# Patient Record
Sex: Male | Born: 1950 | Race: White | Hispanic: No | Marital: Married | Smoking: Former smoker
Health system: Southern US, Community
[De-identification: ages and names within clinical notes are randomized; demographics above are authoritative.]

## PROBLEM LIST (undated history)

## (undated) DIAGNOSIS — I499 Cardiac arrhythmia, unspecified: Secondary | ICD-10-CM

## (undated) DIAGNOSIS — I4811 Longstanding persistent atrial fibrillation: Secondary | ICD-10-CM

## (undated) DIAGNOSIS — M1990 Unspecified osteoarthritis, unspecified site: Secondary | ICD-10-CM

## (undated) DIAGNOSIS — I10 Essential (primary) hypertension: Secondary | ICD-10-CM

## (undated) DIAGNOSIS — C801 Malignant (primary) neoplasm, unspecified: Secondary | ICD-10-CM

## (undated) DIAGNOSIS — E785 Hyperlipidemia, unspecified: Secondary | ICD-10-CM

## (undated) DIAGNOSIS — Z87442 Personal history of urinary calculi: Secondary | ICD-10-CM

## (undated) HISTORY — DX: Hyperlipidemia, unspecified: E78.5

## (undated) HISTORY — DX: Essential (primary) hypertension: I10

## (undated) HISTORY — DX: Longstanding persistent atrial fibrillation: I48.11

---

## 1998-11-09 HISTORY — PX: OTHER SURGICAL HISTORY: SHX169

## 1998-11-30 ENCOUNTER — Encounter: Payer: Self-pay | Admitting: Specialist

## 1998-11-30 ENCOUNTER — Observation Stay (HOSPITAL_COMMUNITY): Admission: RE | Admit: 1998-11-30 | Discharge: 1998-12-01 | Payer: Self-pay | Admitting: Specialist

## 2004-09-02 ENCOUNTER — Emergency Department (HOSPITAL_COMMUNITY): Admission: EM | Admit: 2004-09-02 | Discharge: 2004-09-02 | Payer: Self-pay | Admitting: Emergency Medicine

## 2006-04-09 ENCOUNTER — Ambulatory Visit (HOSPITAL_BASED_OUTPATIENT_CLINIC_OR_DEPARTMENT_OTHER): Admission: RE | Admit: 2006-04-09 | Discharge: 2006-04-09 | Payer: Self-pay | Admitting: Family Medicine

## 2006-04-13 ENCOUNTER — Ambulatory Visit: Payer: Self-pay | Admitting: Internal Medicine

## 2013-04-28 ENCOUNTER — Encounter (HOSPITAL_COMMUNITY): Payer: Self-pay | Admitting: *Deleted

## 2013-04-28 ENCOUNTER — Encounter: Payer: Self-pay | Admitting: Internal Medicine

## 2013-04-28 ENCOUNTER — Ambulatory Visit (INDEPENDENT_AMBULATORY_CARE_PROVIDER_SITE_OTHER): Payer: BC Managed Care – PPO | Admitting: Internal Medicine

## 2013-04-28 VITALS — BP 162/87 | HR 78 | Ht 69.0 in | Wt 185.9 lb

## 2013-04-28 DIAGNOSIS — I10 Essential (primary) hypertension: Secondary | ICD-10-CM

## 2013-04-28 DIAGNOSIS — R5383 Other fatigue: Secondary | ICD-10-CM

## 2013-04-28 DIAGNOSIS — I4892 Unspecified atrial flutter: Secondary | ICD-10-CM | POA: Insufficient documentation

## 2013-04-28 DIAGNOSIS — I493 Ventricular premature depolarization: Secondary | ICD-10-CM

## 2013-04-28 DIAGNOSIS — E785 Hyperlipidemia, unspecified: Secondary | ICD-10-CM | POA: Insufficient documentation

## 2013-04-28 DIAGNOSIS — I491 Atrial premature depolarization: Secondary | ICD-10-CM

## 2013-04-28 DIAGNOSIS — R5381 Other malaise: Secondary | ICD-10-CM

## 2013-04-28 DIAGNOSIS — I499 Cardiac arrhythmia, unspecified: Secondary | ICD-10-CM

## 2013-04-28 DIAGNOSIS — I4949 Other premature depolarization: Secondary | ICD-10-CM

## 2013-04-28 NOTE — Progress Notes (Signed)
OFFICE NOTE  Chief Complaint:  Irregular heart beat, progressive fatigue with exertion  Primary Care Physician: No PCP Per Patient  HPI:  Steve Weaver is a pleasant 62 year old male who has been experiencing some increasing fatigue with exertion and decreased exercise tolerance over the past several years. 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 He has since struggled, working a number of jobs in order to make ends meet.  He used to be very physically active however recently has stopped exercising and has had about 20 pounds of weight gain. He found that he's been more fatigued during certain activities for example planting some bushes in the yard caused him more shortness of breath and fatigue that he was typically use to. He was recently seen for a well check by his primary care provider, including followup on his hypertension and dyslipidemia. He was noted to have an irregular heartbeat an EKG performed apparently showed premature ventricular contractions. I subsequently received the EKG which shows premature atrial contractions. An EKG performed in our office today also shows PACs, for which she is not completely aware of and it is unclear whether this is new or old.  He denies any specific angina.  PMHx:  Past Medical History  Diagnosis Date  . Hyperlipidemia   . Hypertension     History reviewed. No pertinent past surgical history.  FAMHx:  No family history on file.  SOCHx:   reports that he has been smoking Cigars.  He has never used smokeless tobacco. He reports that he drinks about 3.0 ounces of alcohol per week. He reports that he does not use illicit drugs.  ALLERGIES:  No Known Allergies  ROS: A comprehensive review of systems was negative except for: Constitutional: positive for fatigue Respiratory: positive for dyspnea on exertion Cardiovascular: positive for irregular heart beat  HOME  MEDS: Current Outpatient Prescriptions  Medication Sig Dispense Refill  . aspirin 81 MG tablet Take 81 mg by mouth daily.      [name] lisinopril (PRINIVIL,ZESTRIL) 20 MG tablet Take 1 tablet by mouth daily.      [name] lovastatin (MEVACOR) 40 MG tablet Take 1 tablet by mouth daily.      . Multiple Vitamin (MULTIVITAMIN) capsule Take 1 capsule by mouth daily.      . Omega-3 Fatty Acids (FISH OIL) 1200 MG CAPS Take 2,400 mg by mouth daily.       No current facility-administered medications for this visit.    LABS/IMAGING: No results found for this or any previous visit (from the past 48 hour(s)). No results found.  VITALS: BP 162/87  Pulse 78  Ht 5\' 9"  (1.753 m)  Wt 185 lb 14.4 oz (84.324 kg)  BMI 27.44 kg/m2  EXAM: General appearance: alert and no distress Neck: no carotid bruit and no JVD Lungs: clear to auscultation bilaterally Heart: regular rate and rhythm, S1, S2 normal, no murmur, click, rub or gallop Abdomen: soft, non-tender; bowel sounds normal; no masses,  no organomegaly Extremities: extremities normal, atraumatic, no cyanosis or edema Pulses: 2+ and symmetric Skin: Skin color, texture, turgor normal. No rashes or lesions Neurologic: Grossly normal Psych: mood, affect normal  EKG: Normal sinus rhythm at 78, PACs, nonspecific ST changes  ASSESSMENT: 1. Premature atrial contractions 2. Progressive fatigue and dyspnea on exertion 3. Hypertension 4. Dyslipidemia 5. Abnormal EKG  PLAN: 1.   Steve Weaver has an abnormal EKG demonstrating premature atrial contractions. There  was some concern on referral for PVCs however this is not seen on either EKG. After further questioning he's noted some progressive fatigue and dyspnea on exertion, which is not clearly cardiac but could indicate an ischemic problem. It may also be related to weight gain and less exercise. Either way, he does have risk factors for heart disease and given his age in the 75s, would recommend an exercise nuclear  stress test to further risk stratify him. He is interested in getting back into some exercise and weight loss and it would be advisable for stress testing prior to starting an exercise program.  If the stress test is negative, I would not aggressively try to treat his PACs as he is asymptomatic (meaning he did not feel palpitations).  Then I would recommend an exercise program.  Thank you again for this kind referral.  Chrystie Nose, MD, [facility] Attending Cardiologist [facility]  HILTY,Kenneth C 04/28/2013, 4:31 PM

## 2013-04-28 NOTE — Patient Instructions (Signed)
Your physician has requested that you have en exercise stress myoview. For further information please visit [email or web address] Please follow instruction sheet, as given.  Please schedule a follow up appointment after your stress test.

## 2013-05-04 ENCOUNTER — Encounter: Payer: Self-pay | Admitting: Internal Medicine

## 2013-05-04 ENCOUNTER — Ambulatory Visit (HOSPITAL_COMMUNITY)
Admission: RE | Admit: 2013-05-04 | Discharge: 2013-05-04 | Disposition: A | Discharge status: Home / Self Care | Payer: BC Managed Care – PPO | Source: Ambulatory Visit | Attending: Cardiovascular Disease | Admitting: Cardiovascular Disease

## 2013-05-04 ENCOUNTER — Other Ambulatory Visit: Payer: Self-pay | Admitting: *Deleted

## 2013-05-04 DIAGNOSIS — I4892 Unspecified atrial flutter: Secondary | ICD-10-CM

## 2013-05-04 DIAGNOSIS — R0609 Other forms of dyspnea: Secondary | ICD-10-CM | POA: Insufficient documentation

## 2013-05-04 DIAGNOSIS — I4891 Unspecified atrial fibrillation: Secondary | ICD-10-CM

## 2013-05-04 DIAGNOSIS — R5381 Other malaise: Secondary | ICD-10-CM | POA: Insufficient documentation

## 2013-05-04 DIAGNOSIS — I4949 Other premature depolarization: Secondary | ICD-10-CM

## 2013-05-04 DIAGNOSIS — I10 Essential (primary) hypertension: Secondary | ICD-10-CM | POA: Insufficient documentation

## 2013-05-04 DIAGNOSIS — E663 Overweight: Secondary | ICD-10-CM | POA: Insufficient documentation

## 2013-05-04 DIAGNOSIS — R002 Palpitations: Secondary | ICD-10-CM | POA: Insufficient documentation

## 2013-05-04 DIAGNOSIS — R0989 Other specified symptoms and signs involving the circulatory and respiratory systems: Secondary | ICD-10-CM | POA: Insufficient documentation

## 2013-05-04 DIAGNOSIS — E785 Hyperlipidemia, unspecified: Secondary | ICD-10-CM

## 2013-05-04 DIAGNOSIS — R9431 Abnormal electrocardiogram [ECG] [EKG]: Secondary | ICD-10-CM | POA: Insufficient documentation

## 2013-05-04 DIAGNOSIS — I493 Ventricular premature depolarization: Secondary | ICD-10-CM

## 2013-05-04 DIAGNOSIS — R5383 Other fatigue: Secondary | ICD-10-CM

## 2013-05-04 DIAGNOSIS — F17200 Nicotine dependence, unspecified, uncomplicated: Secondary | ICD-10-CM | POA: Insufficient documentation

## 2013-05-04 HISTORY — PX: NM MYOCAR PERF WALL MOTION: HXRAD629

## 2013-05-04 MED ORDER — METOPROLOL TARTRATE 25 MG PO TABS: 25.0000 mg | ORAL_TABLET | Freq: Two times a day (BID) | ORAL | Status: DC

## 2013-05-04 MED ORDER — TECHNETIUM TC 99M SESTAMIBI GENERIC - CARDIOLITE
10.0000 | Freq: Once | INTRAVENOUS | Status: AC | PRN
  Administered 2013-05-04: 10 via INTRAVENOUS

## 2013-05-04 MED ORDER — TECHNETIUM TC 99M SESTAMIBI GENERIC - CARDIOLITE
30.0000 | Freq: Once | INTRAVENOUS | Status: AC | PRN
  Administered 2013-05-04: 30 via INTRAVENOUS

## 2013-05-04 NOTE — Progress Notes (Signed)
Ok .. Thanks.  -[place]

## 2013-05-04 NOTE — Procedures (Addendum)
[facility] [address] [place] [address] [phone]  Cardiology Nuclear Med Study  Steve Weaver is a 62 y.o. male     MRN : [id number]     DOB: Jun 27, 1950  Procedure Date: 05/04/2013  Nuclear Med Background Indication for Stress Test:  Evaluation for Ischemia and Abnormal EKG History:  Pt denies prior history. Cardiac Risk Factors: Hypertension, Lipids, Overweight and Smoker  Symptoms:  DOE, Fatigue and Palpitations   Nuclear Pre-Procedure Caffeine/Decaff Intake:  7:00pm NPO After: 5:00am   IV Site: R Forearm  IV 0.9% NS with Angio Cath:  22g  Chest Size (in):  42"  IV Started by: Emmit Pomfret, RN  Height: 5\' 9"  (1.753 m)  Cup Size: n/a  BMI:  Body mass index is 27.31 kg/(m^2). Weight:  185 lb (83.915 kg)   Tech Comments:  n/a    Nuclear Med Study 1 or 2 day study: 1 day  Stress Test Type:  Stress  Order Authorizing Provider:  Zoila Shutter, MD   Resting Radionuclide: Technetium 35m Sestamibi  Resting Radionuclide Dose: 10.4 mCi   Stress Radionuclide:  Technetium 75m Sestamibi  Stress Radionuclide Dose: 29.8 mCi           Stress Protocol Rest HR: 65 Stress HR:157  Rest BP:145/85 Stress BP: 182/82  Exercise Time (min): 06:18 METS: 7.10   Predicted Max HR: 156 bpm % Max HR: 100.64 bpm    Dose of Adenosine (mg):  n/a Dose of Lexiscan: n/a mg  Dose of Atropine (mg): n/a Dose of Dobutamine: n/a mcg/kg/min (at max HR)  Stress Test Technologist: Ernestene Mention, CCT Nuclear Technologist: Koren Shiver, CNMT   Rest Procedure:  Myocardial perfusion imaging was performed at rest 45 minutes following the intravenous administration of Technetium 86m Sestamibi. Stress Procedure:  The patient performed treadmill exercise using a Bruce  Protocol for 6 minutes and 18 seconds. The patient stopped due to ST Depression. Patient denied any chest pain.  There were  significant ST-T wave changes.  Technetium 79m  Sestamibi was injected at peak exercise and myocardial perfusion imaging was performed after a brief delay.  Transient Ischemic Dilatation (Normal <1.22):  0.64 Lung/Heart Ratio (Normal <0.45):  0.26 QGS EDV:  50 ml QGS ESV:  21 ml LV Ejection Fraction: 59%    Rest ECG: NSR - Normal EKG  Stress ECG: Developed atrial flutter with 2:1 AV block during exercise. During recovery, returned briefly to NSR with PACs, then had more atrial flutter, but also atrial fibrillation with rapid ventricular response.  QPS Raw Data Images:  Normal; no motion artifact; normal heart/lung ratio. Stress Images:  Normal homogeneous uptake in all areas of the myocardium. Rest Images:  Normal homogeneous uptake in all areas of the myocardium. Subtraction (SDS):  No evidence of ischemia. LV Wall Motion:  NL LV Function; NL Wall Motion  Impression Exercise Capacity:  , exercise limited by tacharrhythmia. BP Response:  Hypertensive blood pressure response. Clinical Symptoms:  Palpitations, anxiety ECG Impression:  Cannot interpret ST changes due to prominent atrial flutter waves Comparison with Prior Nuclear Study: No previous nuclear study performed   Overall Impression:  Normal stress nuclear study. Atrial flutter andd atrial fibrillation with rapid ventricular response.  Metoprolol 25 mg BID was ordered. Scheduled for echocardiogram prior to his follow up with Dr. Rennis Golden.   [name], MD  05/04/2013 12:44 PM

## 2013-05-11 ENCOUNTER — Ambulatory Visit (HOSPITAL_COMMUNITY)
Admission: RE | Admit: 2013-05-11 | Discharge: 2013-05-11 | Disposition: A | Discharge status: Home / Self Care | Payer: BC Managed Care – PPO | Source: Ambulatory Visit | Attending: Internal Medicine | Admitting: Internal Medicine

## 2013-05-11 DIAGNOSIS — I4892 Unspecified atrial flutter: Secondary | ICD-10-CM

## 2013-05-11 NOTE — Progress Notes (Signed)
2D Echo Performed 05/11/2013    Bari Handshoe, RCS  

## 2013-05-19 ENCOUNTER — Ambulatory Visit (INDEPENDENT_AMBULATORY_CARE_PROVIDER_SITE_OTHER): Payer: BC Managed Care – PPO | Admitting: Internal Medicine

## 2013-05-19 ENCOUNTER — Encounter: Payer: Self-pay | Admitting: Internal Medicine

## 2013-05-19 VITALS — BP 140/80 | HR 64 | Ht 69.0 in | Wt 187.0 lb

## 2013-05-19 DIAGNOSIS — I10 Essential (primary) hypertension: Secondary | ICD-10-CM

## 2013-05-19 DIAGNOSIS — I4892 Unspecified atrial flutter: Secondary | ICD-10-CM

## 2013-05-19 DIAGNOSIS — E785 Hyperlipidemia, unspecified: Secondary | ICD-10-CM

## 2013-05-19 NOTE — Patient Instructions (Signed)
Your physician wants you to follow-up in:  6 months. You will receive a reminder letter in the mail two months in advance. If you don't receive a letter, please call our office to schedule the follow-up appointment.   

## 2013-05-19 NOTE — Progress Notes (Signed)
OFFICE NOTE  Chief Complaint:  Irregular heart beat, progressive fatigue with exertion  Primary Care Physician: Steve Rily, MD  HPI:  Steve Weaver is a pleasant 62 year old male who has been experiencing some increasing fatigue with exertion and decreased exercise tolerance over the past several years. 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 He has since struggled, working a number of jobs in order to make ends meet.  He used to be very physically active however recently has stopped exercising and has had about 20 pounds of weight gain. He found that he's been more fatigued during certain activities for example planting some bushes in the yard caused him more shortness of breath and fatigue that he was typically use to. He was recently seen for a well check by his primary care provider, including followup on his hypertension and dyslipidemia. He was noted to have an irregular heartbeat an EKG performed apparently showed premature ventricular contractions. I subsequently received the EKG which shows premature atrial contractions. An EKG performed in our office today also shows PACs, for which she is not completely aware of and it is unclear whether this is new or old.  He denies any specific angina.  [name] underwent an echocardiogram and nuclear stress test in our office at the end of October and early November. The echocardiogram demonstrated normal systolic function with mild aortic insufficiency and mild mitral insufficiency. There was borderline right atrial enlargement.  He was reportedly very nervous about a stress test and at the onset was noted to be in a rapid heart rate of 150 which was regular and there were clear flutter waves. This terminated spontaneously. Perfusion imaging did not demonstrate any defects.  PMHx:  Past Medical History  Diagnosis Date  . Hyperlipidemia   . Hypertension      History reviewed. No pertinent past surgical history.  FAMHx:  Family History  Problem Relation Age of Onset  . Anemia Neg Hx   . Arrhythmia Neg Hx   . Asthma Neg Hx   . Clotting disorder Neg Hx   . Fainting Neg Hx   . Heart attack Neg Hx   . Heart disease Neg Hx   . Heart failure Neg Hx   . Hyperlipidemia Neg Hx   . Hypertension Neg Hx     SOCHx:   reports that he has been smoking Cigars.  He has never used smokeless tobacco. He reports that he drinks about 3.0 ounces of alcohol per week. He reports that he does not use illicit drugs.  ALLERGIES:  No Known Allergies  ROS: A comprehensive review of systems was negative except for: Constitutional: positive for fatigue Respiratory: positive for dyspnea on exertion Cardiovascular: positive for irregular heart beat  HOME MEDS: Current Outpatient Prescriptions  Medication Sig Dispense Refill  . aspirin 81 MG tablet Take 81 mg by mouth daily.      [name] lisinopril (PRINIVIL,ZESTRIL) 20 MG tablet Take 1 tablet by mouth daily.      [name] lovastatin (MEVACOR) 40 MG tablet Take 1 tablet by mouth daily.      . metoprolol tartrate (LOPRESSOR) 25 MG tablet Take 1 tablet (25 mg total) by mouth 2 (two) times daily.  60 tablet  6  . Multiple Vitamin (MULTIVITAMIN) capsule Take 1 capsule by mouth daily.      . Omega-3 Fatty Acids (FISH OIL) 1200 MG CAPS Take 2,400 mg by mouth daily.  No current facility-administered medications for this visit.    LABS/IMAGING: No results found for this or any previous visit (from the past 48 hour(s)). No results found.  VITALS: BP 140/80  Pulse 64  Ht 5\' 9"  (1.753 m)  Wt 187 lb (84.823 kg)  BMI 27.60 kg/m2  EXAM: deferred  EKG: Normal sinus rhythm at 64  ASSESSMENT: 1. Premature atrial contractions 2. Paroxysmal atrial flutter - CHADS2VASC score of 1 3. Hypertension 4. Dyslipidemia  PLAN: 1.   [name] had a negative nuclear stress test and an echocardiogram which showed some mild  valvular abnormalities. What was notable was that at rest prior to starting his stress test, he was noted to be in a regular tachycardia with flutter waves which appears to be atrial flutter and 2:1 AV conduction.  This was very brief and terminated spontaneously. He was subsequently started on metoprolol twice daily and is tolerating this well. He reports he has had a marked decrease in his palpitations and feels that his anxiety episodes are improved significantly.  We had a discussion today about his risk of recurrent flutter and the risk of stroke. His CHADS2VASC score is 1, which could necessitate increased anticoagulation. We did discuss the risk and benefits given his relatively younger age and in frequency of palpitations. It does seem that he's had significant improvement with metoprolol, and at this point he wishes to stay on aspirin therapy alone.  A plan to see him back in 6 months.  Steve Nose, MD, [facility] Attending Cardiologist [facility]  Steve Weaver 05/19/2013, 9:54 AM

## 2013-11-05 ENCOUNTER — Encounter: Payer: Self-pay | Admitting: *Deleted

## 2013-11-08 ENCOUNTER — Encounter: Payer: Self-pay | Admitting: Internal Medicine

## 2013-11-08 ENCOUNTER — Ambulatory Visit (INDEPENDENT_AMBULATORY_CARE_PROVIDER_SITE_OTHER): Payer: BC Managed Care – PPO | Admitting: Internal Medicine

## 2013-11-08 VITALS — BP 168/92 | HR 67 | Ht 69.0 in | Wt 188.0 lb

## 2013-11-08 DIAGNOSIS — R5381 Other malaise: Secondary | ICD-10-CM

## 2013-11-08 DIAGNOSIS — I491 Atrial premature depolarization: Secondary | ICD-10-CM

## 2013-11-08 DIAGNOSIS — R5383 Other fatigue: Secondary | ICD-10-CM

## 2013-11-08 DIAGNOSIS — I10 Essential (primary) hypertension: Secondary | ICD-10-CM

## 2013-11-08 DIAGNOSIS — E785 Hyperlipidemia, unspecified: Secondary | ICD-10-CM

## 2013-11-08 DIAGNOSIS — I4892 Unspecified atrial flutter: Secondary | ICD-10-CM

## 2013-11-08 NOTE — Patient Instructions (Signed)
Your physician wants you to follow-up in:  6 months. You will receive a reminder letter in the mail two months in advance. If you don't receive a letter, please call our office to schedule the follow-up appointment.   

## 2013-11-08 NOTE — Progress Notes (Signed)
OFFICE NOTE  Chief Complaint:  Irregular heart beat, progressive fatigue with exertion  Primary Care Physician: Andria Frames, MD  HPI:  Steve Weaver is a pleasant 63 year old male who has been experiencing some increasing fatigue with exertion and decreased exercise tolerance over the past several years. 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 He has since struggled, working a number of jobs in order to make ends meet.  He used to be very physically active however recently has stopped exercising and has had about 20 pounds of weight gain. He found that he's been more fatigued during certain activities for example planting some bushes in the yard caused him more shortness of breath and fatigue that he was typically use to. He was recently seen for a well check by his primary care provider, including followup on his hypertension and dyslipidemia. He was noted to have an irregular heartbeat an EKG performed apparently showed premature ventricular contractions. I subsequently received the EKG which shows premature atrial contractions. An EKG performed in our office today also shows PACs, for which she is not completely aware of and it is unclear whether this is new or old.  He denies any specific angina.  Steve Weaver underwent an echocardiogram and nuclear stress test in our office at the end of October and early November. The echocardiogram demonstrated normal systolic function with mild aortic insufficiency and mild mitral insufficiency. There was borderline right atrial enlargement.  He was reportedly very nervous about a stress test and at the onset was noted to be in a rapid heart rate of 150 which was regular and there were clear flutter waves. This terminated spontaneously. Perfusion imaging did not demonstrate any defects.  He returns today and has few complaints. He says that in January he was having some  problems with dizziness and felt that he was on too much blood pressure medication. He self discontinued his lisinopril and feels that he's gotten somewhat better. He occasionally has palpitations but only when he is anxious. Blood pressure is noted to be elevated today 168/92. He says he is not taking his blood pressures at home to compare and tablet [place] measurement. He recently had a mobile health screening which showed total cholesterol 190, LDL 109, HDL 48 and triglycerides 178.  PMHx:  Past Medical History  Diagnosis Date  . Hyperlipidemia   . Hypertension   . PAF (paroxysmal atrial fibrillation)     Past Surgical History  Procedure Laterality Date  . Broke leg  11/1998  . Nm myocar perf wall motion  05/04/2013    bruce myoview - normal stress nuclear study; a-flutter & a-fib with RVR, EF 59%    FAMHx:  Family History  Problem Relation Age of Onset  . Anemia Neg Hx   . Arrhythmia Neg Hx   . Asthma Neg Hx   . Clotting disorder Neg Hx   . Fainting Neg Hx   . Heart attack Neg Hx   . Heart disease Neg Hx   . Heart failure Neg Hx   . Hyperlipidemia Mother   . Hypertension Mother   . Emphysema Mother   . Diabetes Father   . Dementia Father     SOCHx:   reports that he has been smoking Cigars.  He has never used smokeless tobacco. He reports that he drinks about 3 ounces of alcohol per week. He reports that he does not use illicit  drugs.  ALLERGIES:  Allergies  Allergen Reactions  . Other Rash    Muscle Relaxer     ROS: A comprehensive review of systems was negative except for: Constitutional: positive for fatigue Respiratory: positive for dyspnea on exertion Cardiovascular: positive for irregular heart beat  HOME MEDS: Current Outpatient Prescriptions  Medication Sig Dispense Refill  . aspirin 81 MG tablet Take 81 mg by mouth daily.      [name] lovastatin (MEVACOR) 40 MG tablet Take 1 tablet by mouth daily.      . metoprolol tartrate (LOPRESSOR) 25 MG tablet Take 1  tablet (25 mg total) by mouth 2 (two) times daily.  60 tablet  6  . Multiple Vitamin (MULTIVITAMIN) capsule Take 1 capsule by mouth daily.      . Omega-3 Fatty Acids (FISH OIL) 1200 MG CAPS Take 2,400 mg by mouth daily.       No current facility-administered medications for this visit.    LABS/IMAGING: No results found for this or any previous visit (from the past 48 hour(s)). No results found.  VITALS: BP 168/92  Pulse 67  Ht 5\' 9"  (1.753 m)  Wt 188 lb (85.276 kg)  BMI 27.75 kg/m2  EXAM: Gen.: Awake, in no apparent distress HEENT: PERRLA, M.D. Lungs: Clear to auscultation bilaterally Cardiovascular: Regular rate and rhythm normal S1-S2 no S3 no murmurs rubs or gallops Abdomen: Soft, nontender, positive bowel sounds Extremity: No edema Neurologic: No gross deficits Psychiatric: Mood, affect normal Skin: No rashes  EKG: Normal sinus rhythm at 67  ASSESSMENT: 1. Premature atrial contractions - improved 2. Paroxysmal atrial flutter - CHADS2VASC score of 1 (with exercise) 3. Hypertension 4. Dyslipidemia  PLAN: 1.   Steve Weaver is not report any significant palpitations now on a beta blocker. I do not appreciate a return of his atrial flutter which may been related to exercise. Blood pressure remains elevated is persistent he discontinued his lisinopril which was due to feeling weak and or tired. He does say that he feels better but his blood pressure is higher today. I would like him to keep track of his blood pressures at home and contact her office with the laboratory measurements to see if we need to adjust his medications further. Finally his cholesterol is fairly well controlled with LDL of 109 on lovastatin. No changes on that medication today. Plan to see him back in 6 months or sooner as necessary.  Pixie Casino, MD, [facility] Attending Cardiologist [place] 11/08/2013, 12:45 PM

## 2013-11-11 NOTE — Addendum Note (Signed)
Addended by: Vear Clock on: 11/11/2013 11:17 AM   Modules accepted: Orders

## 2013-11-26 ENCOUNTER — Other Ambulatory Visit: Payer: Self-pay | Admitting: Cardiovascular Disease

## 2013-11-26 NOTE — Telephone Encounter (Signed)
Rx was sent to pharmacy electronically. 

## 2014-03-07 ENCOUNTER — Telehealth: Payer: Self-pay | Admitting: Internal Medicine

## 2014-03-08 NOTE — Telephone Encounter (Signed)
Closed encounter °

## 2014-04-18 ENCOUNTER — Other Ambulatory Visit: Payer: Self-pay

## 2014-04-18 MED ORDER — LOVASTATIN 40 MG PO TABS: 40.0000 mg | ORAL_TABLET | Freq: Every day | ORAL | Status: DC

## 2014-04-18 NOTE — Telephone Encounter (Signed)
Rx sent to pharmacy   

## 2014-04-21 ENCOUNTER — Telehealth: Payer: Self-pay

## 2014-04-21 NOTE — Telephone Encounter (Signed)
Left a message for patient to call to clarify if he is taking Lisinopril. Patient has a prescription refill request to come on fax machine for Lisinopril but it's not on medication list and I need to know if patient is taking it.

## 2014-04-26 ENCOUNTER — Other Ambulatory Visit: Payer: Self-pay | Admitting: *Deleted

## 2014-04-26 NOTE — Telephone Encounter (Signed)
Rx refill received multiple times for Lisinopril, med is no longer on patient list. Patient has been called multiple times for carnification but has not returned. Rx will be denied until patient can clarify

## 2014-05-02 ENCOUNTER — Encounter: Payer: Self-pay | Admitting: Internal Medicine

## 2014-05-02 ENCOUNTER — Ambulatory Visit (INDEPENDENT_AMBULATORY_CARE_PROVIDER_SITE_OTHER): Payer: BC Managed Care – PPO | Admitting: Internal Medicine

## 2014-05-02 VITALS — BP 150/82 | HR 74 | Ht 68.0 in | Wt 189.1 lb

## 2014-05-02 DIAGNOSIS — I10 Essential (primary) hypertension: Secondary | ICD-10-CM

## 2014-05-02 DIAGNOSIS — E785 Hyperlipidemia, unspecified: Secondary | ICD-10-CM

## 2014-05-02 DIAGNOSIS — I4892 Unspecified atrial flutter: Secondary | ICD-10-CM

## 2014-05-02 DIAGNOSIS — I491 Atrial premature depolarization: Secondary | ICD-10-CM

## 2014-05-02 DIAGNOSIS — R5383 Other fatigue: Secondary | ICD-10-CM

## 2014-05-02 NOTE — Progress Notes (Signed)
OFFICE NOTE  Chief Complaint:  Very infrequent palpitations  Primary Care Physician: Andria Frames, MD  HPI:  Vincenzo Stave is a pleasant 63 year old male who has been experiencing some increasing fatigue with exertion and decreased exercise tolerance over the past several years. 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 He has since struggled, working a number of jobs in order to make ends meet.  He used to be very physically active however recently has stopped exercising and has had about 20 pounds of weight gain. He found that he's been more fatigued during certain activities for example planting some bushes in the yard caused him more shortness of breath and fatigue that he was typically use to. He was recently seen for a well check by his primary care provider, including followup on his hypertension and dyslipidemia. He was noted to have an irregular heartbeat an EKG performed apparently showed premature ventricular contractions. I subsequently received the EKG which shows premature atrial contractions. An EKG performed in our office today also shows PACs, for which she is not completely aware of and it is unclear whether this is new or old.  He denies any specific angina.  [name] underwent an echocardiogram and nuclear stress test in our office at the end of October and early November. The echocardiogram demonstrated normal systolic function with mild aortic insufficiency and mild mitral insufficiency. There was borderline right atrial enlargement.  He was reportedly very nervous about a stress test and at the onset was noted to be in a rapid heart rate of 150 which was regular and there were clear flutter waves. This terminated spontaneously. Perfusion imaging did not demonstrate any defects.  [name] stopped his lisinopril due to what he felt was low blood pressures. He brought in a list of his blood  pressures today and 8 do seem to be pretty well controlled at home. He reports some of his symptoms have improved. He denies any chest pain or significant recurrent palpitations.  PMHx:  Past Medical History  Diagnosis Date  . Hyperlipidemia   . Hypertension   . PAF (paroxysmal atrial fibrillation)     Past Surgical History  Procedure Laterality Date  . Broke leg  11/1998  . Nm myocar perf wall motion  05/04/2013    bruce myoview - normal stress nuclear study; a-flutter & a-fib with RVR, EF 59%    FAMHx:  Family History  Problem Relation Age of Onset  . Anemia Neg Hx   . Arrhythmia Neg Hx   . Asthma Neg Hx   . Clotting disorder Neg Hx   . Fainting Neg Hx   . Heart attack Neg Hx   . Heart disease Neg Hx   . Heart failure Neg Hx   . Hyperlipidemia Mother   . Hypertension Mother   . Emphysema Mother   . Diabetes Father   . Dementia Father     SOCHx:   reports that he has been smoking Cigars.  He has never used smokeless tobacco. He reports that he drinks about 3.0 oz of alcohol per week. He reports that he does not use illicit drugs.  ALLERGIES:  Allergies  Allergen Reactions  . Other Rash    Muscle Relaxer     ROS: A comprehensive review of systems was negative except for: Cardiovascular: positive for irregular heart beat  HOME MEDS: Current Outpatient Prescriptions  Medication Sig Dispense Refill  .  aspirin 81 MG tablet Take 81 mg by mouth daily.    [name] lovastatin (MEVACOR) 40 MG tablet Take 1 tablet (40 mg total) by mouth daily. 30 tablet 7  . metoprolol tartrate (LOPRESSOR) 25 MG tablet TAKE 1 TABLET (25 MG TOTAL) BY MOUTH 2 (TWO) TIMES DAILY. 60 tablet 11  . Multiple Vitamin (MULTIVITAMIN) capsule Take 1 capsule by mouth daily.    . Omega-3 Fatty Acids (FISH OIL) 1200 MG CAPS Take 2,400 mg by mouth daily.     No current facility-administered medications for this visit.    LABS/IMAGING: No results found for this or any previous visit (from the past 48  hour(s)). No results found.  VITALS: BP 150/82 mmHg  Pulse 74  Ht 5\' 8"  (1.727 m)  Wt 189 lb 1.6 oz (85.775 kg)  BMI 28.76 kg/m2  EXAM: Gen.: Awake, in no apparent distress HEENT: PERRLA, M.D. Lungs: Clear to auscultation bilaterally Cardiovascular: Regular rate and rhythm normal S1-S2 no S3 no murmurs rubs or gallops Abdomen: Soft, nontender, positive bowel sounds Extremity: No edema Neurologic: No gross deficits Psychiatric: Mood, affect normal Skin: No rashes  EKG: Normal sinus rhythm at 74  ASSESSMENT: 1. Premature atrial contractions - improved 2. Paroxysmal atrial flutter - CHADS2VASC score of 1 (with exercise) 3. Hypertension 4. Dyslipidemia  PLAN: 1.   [name] is not report any significant palpitations now on a beta blocker. I do not appreciate a return of his atrial flutter which may been related to exercise. Home blood pressure readings do appear to be somewhat improved. I think a lot of this may be related to stress. He has eliminated one of his blood pressure medications and seems to be doing well on the Lopressor. I recommend continuing his current medication. Overall he is doing well we'll plan to see him back annually or sooner as necessary.   Pixie Casino, MD, [facility] Attending Cardiologist [facility]  HILTY,Kenneth C 05/02/2014, 11:31 AM

## 2014-05-02 NOTE — Patient Instructions (Signed)
Your physician wants you to follow-up in: 1 year with Dr. Hilty. You will receive a reminder letter in the mail two months in advance. If you don't receive a letter, please call our office to schedule the follow-up appointment.  

## 2014-11-14 ENCOUNTER — Other Ambulatory Visit: Payer: Self-pay | Admitting: Internal Medicine

## 2014-11-14 NOTE — Telephone Encounter (Signed)
Rx has been sent to the pharmacy electronically. ° °

## 2014-12-17 ENCOUNTER — Other Ambulatory Visit: Payer: Self-pay | Admitting: Internal Medicine

## 2015-04-14 ENCOUNTER — Other Ambulatory Visit: Payer: Self-pay | Admitting: Internal Medicine

## 2015-05-01 ENCOUNTER — Encounter: Payer: Self-pay | Admitting: Internal Medicine

## 2015-05-01 ENCOUNTER — Ambulatory Visit (INDEPENDENT_AMBULATORY_CARE_PROVIDER_SITE_OTHER): Payer: PPO | Admitting: Internal Medicine

## 2015-05-01 VITALS — BP 130/92 | HR 119 | Ht 68.0 in | Wt 197.6 lb

## 2015-05-01 DIAGNOSIS — I4892 Unspecified atrial flutter: Secondary | ICD-10-CM | POA: Diagnosis not present

## 2015-05-01 DIAGNOSIS — I10 Essential (primary) hypertension: Secondary | ICD-10-CM

## 2015-05-01 DIAGNOSIS — I491 Atrial premature depolarization: Secondary | ICD-10-CM | POA: Diagnosis not present

## 2015-05-01 DIAGNOSIS — Z79899 Other long term (current) drug therapy: Secondary | ICD-10-CM | POA: Diagnosis not present

## 2015-05-01 DIAGNOSIS — E785 Hyperlipidemia, unspecified: Secondary | ICD-10-CM

## 2015-05-01 DIAGNOSIS — R5383 Other fatigue: Secondary | ICD-10-CM

## 2015-05-01 MED ORDER — METOPROLOL TARTRATE 25 MG PO TABS
25.0000 mg | ORAL_TABLET | Freq: Two times a day (BID) | ORAL | Status: DC
Start: 2015-05-01 — End: 2015-10-16

## 2015-05-01 MED ORDER — APIXABAN 5 MG PO TABS: 5.0000 mg | ORAL_TABLET | Freq: Two times a day (BID) | ORAL | Status: DC

## 2015-05-01 MED ORDER — DILTIAZEM HCL ER COATED BEADS 120 MG PO TB24: 120.0000 mg | ORAL_TABLET | Freq: Every day | ORAL | Status: DC

## 2015-05-01 MED ORDER — LOVASTATIN 40 MG PO TABS: ORAL_TABLET | ORAL | Status: DC

## 2015-05-01 NOTE — Patient Instructions (Addendum)
Medication Instructions:   STOP aspirin START eliquis 5mg  twice daily START diltiazem 120mg  once daily in the morning  Samples for eliquis 5mg  (2 boxes) and free 30 day card given to patient  Labwork:  FASTING lab work next week - CMET, lipid  Testing/Procedures:  NONE  Follow-Up:  3-4 weeks with Dr. Debara Pickett  Any Other Special Instructions Will Be Listed Below (If Applicable).

## 2015-05-01 NOTE — Progress Notes (Signed)
OFFICE NOTE  Chief Complaint:  No complaints  Primary Care Physician: Andria Frames, MD  HPI:  Steve Weaver is a pleasant 64 year old male who has been experiencing some increasing fatigue with exertion and decreased exercise tolerance over the past several years. 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 He has since struggled, working a number of jobs in order to make ends meet.  He used to be very physically active however recently has stopped exercising and has had about 20 pounds of weight gain. He found that he's been more fatigued during certain activities for example planting some bushes in the yard caused him more shortness of breath and fatigue that he was typically use to. He was recently seen for a well check by his primary care provider, including followup on his hypertension and dyslipidemia. He was noted to have an irregular heartbeat an EKG performed apparently showed premature ventricular contractions. I subsequently received the EKG which shows premature atrial contractions. An EKG performed in our office today also shows PACs, for which she is not completely aware of and it is unclear whether this is new or old.  He denies any specific angina.  [name] underwent an echocardiogram and nuclear stress test in our office at the end of October and early November. The echocardiogram demonstrated normal systolic function with mild aortic insufficiency and mild mitral insufficiency. There was borderline right atrial enlargement.  He was reportedly very nervous about a stress test and at the onset was noted to be in a rapid heart rate of 150 which was regular and there were clear flutter waves. This terminated spontaneously. Perfusion imaging did not demonstrate any defects.  [name] stopped his lisinopril due to what he felt was low blood pressures. He brought in a list of his blood pressures today and  8 do seem to be pretty well controlled at home. He reports some of his symptoms have improved. He denies any chest pain or significant recurrent palpitations.  I saw [name] back today in the office. He reports no complaints. He denies any chest pain, shortness of breath or worsening fatigue. Blood pressure was mildly elevated today 130/92. EKG shows atrial flutter with variable ventricular response at 119. He is completely unaware of this. He does have a history of paroxysmal atrial flutter in the past but it seems to be remotely. He is now 66 inches CHADSVASC score is 2. He is currently on aspirin only.  PMHx:  Past Medical History  Diagnosis Date  . Hyperlipidemia   . Hypertension   . PAF (paroxysmal atrial fibrillation) [facility])     Past Surgical History  Procedure Laterality Date  . Broke leg  11/1998  . Nm myocar perf wall motion  05/04/2013    bruce myoview - normal stress nuclear study; a-flutter & a-fib with RVR, EF 59%    FAMHx:  Family History  Problem Relation Age of Onset  . Anemia Neg Hx   . Arrhythmia Neg Hx   . Asthma Neg Hx   . Clotting disorder Neg Hx   . Fainting Neg Hx   . Heart attack Neg Hx   . Heart disease Neg Hx   . Heart failure Neg Hx   . Hyperlipidemia Mother   . Hypertension Mother   . Emphysema Mother   . Diabetes Father   . Dementia Father     SOCHx:   reports that  he has quit smoking. His smoking use included Cigars. He has never used smokeless tobacco. He reports that he drinks about 3.6 oz of alcohol per week. He reports that he does not use illicit drugs.  ALLERGIES:  Allergies  Allergen Reactions  . Other Rash    Muscle Relaxer     ROS: A comprehensive review of systems was negative.  HOME MEDS: Current Outpatient Prescriptions  Medication Sig Dispense Refill  . lovastatin (MEVACOR) 40 MG tablet TAKE 1 TABLET (40 MG TOTAL) BY MOUTH DAILY. 90 tablet 3  . metoprolol tartrate (LOPRESSOR) 25 MG tablet Take 1 tablet (25 mg total) by  mouth 2 (two) times daily. 180 tablet 3  . Multiple Vitamin (MULTIVITAMIN) capsule Take 1 capsule by mouth daily.    . Omega-3 Fatty Acids (FISH OIL) 1200 MG CAPS Take 2,400 mg by mouth daily.    [name] apixaban (ELIQUIS) 5 MG TABS tablet Take 1 tablet (5 mg total) by mouth 2 (two) times daily. 60 tablet 6  . diltiazem (CARDIZEM LA) 120 MG 24 hr tablet Take 1 tablet (120 mg total) by mouth daily. 90 tablet 3   No current facility-administered medications for this visit.    LABS/IMAGING: No results found for this or any previous visit (from the past 48 hour(s)). No results found.  VITALS: BP 130/92 mmHg  Pulse 119  Ht 5\' 8"  (1.727 m)  Wt 197 lb 9.6 oz (89.631 kg)  BMI 30.05 kg/m2  EXAM: Gen.: Awake, in no apparent distress HEENT: PERRLA, EOMI Lungs: Clear to auscultation bilaterally Cardiovascular: Irregularly irregular with rapid response  Abdomen: Soft, nontender, positive bowel sounds Extremity: No edema Neurologic: No gross deficits Psychiatric: Mood, affect normal Skin: No rashes  EKG: Atrial flutter with variable AV block at 119  ASSESSMENT: 1. Paroxysmal atrial flutter, with rapid ventricular response - CHADS2VASC score of 2 2. Hypertension 3. Dyslipidemia  PLAN: 1.   [name] is in atrial flutter with rapid ventricular response today but is reportedly asymptomatic. He broke through his metoprolol dosing therefore will need an additional agent to help with rate control. I recommend starting Cardizem CD 120 mg daily. In addition his CHADSVASC score is 2, therefore aspirin will be insufficient protection for stroke. I like to place him on eloquence 5 mg twice a day. He can stop aspirin. Will plan to see him back in a month. If he remains in atrial flutter at that time, we'll consider cardioversion.  Pixie Casino, MD, [facility] Attending Cardiologist [place] C Hilty 05/01/2015, 6:11 PM

## 2015-05-11 LAB — COMPREHENSIVE METABOLIC PANEL
ALT: 40 U/L (ref 9–46)
AST: 26 U/L (ref 10–35)
Albumin: 4.3 g/dL (ref 3.6–5.1)
Alkaline Phosphatase: 85 U/L (ref 40–115)
BUN: 19 mg/dL (ref 7–25)
CO2: 26 mmol/L (ref 20–31)
Calcium: 9.6 mg/dL (ref 8.6–10.3)
Chloride: 105 mmol/L (ref 98–110)
Creat: 1.4 mg/dL — ABNORMAL HIGH (ref 0.70–1.25)
Glucose, Bld: 94 mg/dL (ref 65–99)
Potassium: 4.8 mmol/L (ref 3.5–5.3)
Sodium: 141 mmol/L (ref 135–146)
Total Bilirubin: 0.7 mg/dL (ref 0.2–1.2)
Total Protein: 6.9 g/dL (ref 6.1–8.1)

## 2015-05-11 LAB — LIPID PANEL
Cholesterol: 184 mg/dL (ref 125–200)
HDL: 43 mg/dL (ref >=40)
LDL Cholesterol: 98 mg/dL (ref <130)
Total CHOL/HDL Ratio: 4.3 Ratio (ref <=5.0)
Triglycerides: 217 mg/dL — ABNORMAL HIGH (ref <150)
VLDL: 43 mg/dL — ABNORMAL HIGH (ref <30)

## 2015-05-12 ENCOUNTER — Telehealth: Payer: Self-pay | Admitting: Internal Medicine

## 2015-05-12 MED ORDER — DILTIAZEM HCL ER 120 MG PO CP24: 120.0000 mg | ORAL_CAPSULE | Freq: Every day | ORAL | Status: DC

## 2015-05-12 NOTE — Telephone Encounter (Signed)
Checked formulary for [employer] 2017.  Looks like Dilt XR is a covered product.  Sent rx to pharmacy.  Advised pt to call Monday if cost is still unacceptable.

## 2015-05-12 NOTE — Telephone Encounter (Signed)
Spoke to [name]. His insurance has the Cardizem LA 120mg  as a Tier 4 drug, and the cost is prohibitive for him.   Discussed w/ him that less expensive option would be more frequently than once a day, he is fine w/ this.   Will route for dose recommendation.

## 2015-05-12 NOTE — Telephone Encounter (Signed)
Dr. Debara Pickett prescribed Diltiazem 120 mg.  Patient states this is very expensive and wants to know it there is another medications he can take.  Please call.

## 2015-05-29 ENCOUNTER — Encounter: Payer: Self-pay | Admitting: Internal Medicine

## 2015-05-29 ENCOUNTER — Ambulatory Visit (INDEPENDENT_AMBULATORY_CARE_PROVIDER_SITE_OTHER): Payer: PPO | Admitting: Internal Medicine

## 2015-05-29 VITALS — BP 144/82 | HR 85 | Ht 68.0 in | Wt 199.0 lb

## 2015-05-29 DIAGNOSIS — D689 Coagulation defect, unspecified: Secondary | ICD-10-CM | POA: Diagnosis not present

## 2015-05-29 DIAGNOSIS — I4819 Other persistent atrial fibrillation: Secondary | ICD-10-CM

## 2015-05-29 DIAGNOSIS — I4891 Unspecified atrial fibrillation: Secondary | ICD-10-CM

## 2015-05-29 DIAGNOSIS — Z01812 Encounter for preprocedural laboratory examination: Secondary | ICD-10-CM

## 2015-05-29 DIAGNOSIS — I10 Essential (primary) hypertension: Secondary | ICD-10-CM

## 2015-05-29 DIAGNOSIS — I481 Persistent atrial fibrillation: Secondary | ICD-10-CM

## 2015-05-29 DIAGNOSIS — I4892 Unspecified atrial flutter: Secondary | ICD-10-CM

## 2015-05-29 DIAGNOSIS — R5383 Other fatigue: Secondary | ICD-10-CM

## 2015-05-29 NOTE — Patient Instructions (Signed)
Your physician has recommended that you have a Cardioversion (DCCV). Electrical Cardioversion uses a jolt of electricity to your heart either through paddles or wired patches attached to your chest. This is a controlled, usually prescheduled, procedure. Defibrillation is done under light anesthesia in the hospital, and you usually go home the day of the procedure. This is done to get your heart back into a normal rhythm. You are not awake for the procedure. Please see the instruction sheet given to you today.  You will have to have blood work and a chest x-ray done no more than 7 days prior to the procedure. The chest x-ray needs to be done at [facility]. The blood work can be done at any [facility].  Dr Debara Pickett recommends that you schedule a follow-up appointment in JANUARY 2017.   **PLEASE REMEMBER TO TAKE EVERY DOSE OF THE ELIQUIS!!

## 2015-05-29 NOTE — H&P (Signed)
OFFICE NOTE  Chief Complaint:  No complaints  Primary Care Physician: Andria Frames, MD  HPI:  Steve Weaver is a pleasant 64 year old male who has been experiencing some increasing fatigue with exertion and decreased exercise tolerance over the past several years. 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 He has since struggled, working a number of jobs in order to make ends meet.  He used to be very physically active however recently has stopped exercising and has had about 20 pounds of weight gain. He found that he's been more fatigued during certain activities for example planting some bushes in the yard caused him more shortness of breath and fatigue that he was typically use to. He was recently seen for a well check by his primary care provider, including followup on his hypertension and dyslipidemia. He was noted to have an irregular heartbeat an EKG performed apparently showed premature ventricular contractions. I subsequently received the EKG which shows premature atrial contractions. An EKG performed in our office today also shows PACs, for which she is not completely aware of and it is unclear whether this is new or old.  He denies any specific angina.  [name] underwent an echocardiogram and nuclear stress test in our office at the end of October and early November. The echocardiogram demonstrated normal systolic function with mild aortic insufficiency and mild mitral insufficiency. There was borderline right atrial enlargement.  He was reportedly very nervous about a stress test and at the onset was noted to be in a rapid heart rate of 150 which was regular and there were clear flutter waves. This terminated spontaneously. Perfusion imaging did not demonstrate any defects.  [name] stopped his lisinopril due to what he felt was low blood pressures. He brought in a list of his blood pressures today and  8 do seem to be pretty well controlled at home. He reports some of his symptoms have improved. He denies any chest pain or significant recurrent palpitations.  I saw [name] back today in the office. He reports no complaints. He denies any chest pain, shortness of breath or worsening fatigue. Blood pressure was mildly elevated today 130/92. EKG shows atrial flutter with variable ventricular response at 119. He is completely unaware of this. He does have a history of paroxysmal atrial flutter in the past but it seems to be remotely. He is now 66 inches CHADSVASC score is 2. He is currently on aspirin only.   [name] [name] for follow-up. EKG shows persistent atrial fibrillation, however last visit he was in atrial flutter. Rate is controlled at 91. He gets an occasional hollow feeling in the chest and may have some fatigue related to atrial fibrillation. He's now been on Eliquis for close to one month. We discussed options including rate control versus rhythm control with a cardioversion procedure. After discussing the procedure he wishes to proceed with a cardioversion.   PMHx:  Past Medical History  Diagnosis Date  . Hyperlipidemia   . Hypertension   . PAF (paroxysmal atrial fibrillation) [facility])     Past Surgical History  Procedure Laterality Date  . Broke leg  11/1998  . Nm myocar perf wall motion  05/04/2013    bruce myoview - normal stress nuclear study; a-flutter & a-fib with RVR, EF 59%    FAMHx:  Family History  Problem Relation Age of Onset  . Anemia Neg  Hx   . Arrhythmia Neg Hx   . Asthma Neg Hx   . Clotting disorder Neg Hx   . Fainting Neg Hx   . Heart attack Neg Hx   . Heart disease Neg Hx   . Heart failure Neg Hx   . Hyperlipidemia Mother   . Hypertension Mother   . Emphysema Mother   . Diabetes Father   . Dementia Father     SOCHx:   reports that he has quit smoking. His smoking use included Cigars. He has never used smokeless tobacco. He  reports that he drinks about 3.6 oz of alcohol per week. He reports that he does not use illicit drugs.  ALLERGIES:  Allergies  Allergen Reactions  . Other Rash    Muscle Relaxer     ROS: A comprehensive review of systems was negative.  HOME MEDS: Current Outpatient Prescriptions  Medication Sig Dispense Refill  . apixaban (ELIQUIS) 5 MG TABS tablet Take 1 tablet (5 mg total) by mouth 2 (two) times daily. 60 tablet 6  . diltiazem (DILACOR XR) 120 MG 24 hr capsule Take 1 capsule (120 mg total) by mouth daily. 30 capsule 5  . lovastatin (MEVACOR) 40 MG tablet TAKE 1 TABLET (40 MG TOTAL) BY MOUTH DAILY. 90 tablet 3  . metoprolol tartrate (LOPRESSOR) 25 MG tablet Take 1 tablet (25 mg total) by mouth 2 (two) times daily. 180 tablet 3  . Multiple Vitamin (MULTIVITAMIN) capsule Take 1 capsule by mouth daily.    . Omega-3 Fatty Acids (FISH OIL) 1200 MG CAPS Take 2,400 mg by mouth daily.     No current facility-administered medications for this visit.    LABS/IMAGING: No results found for this or any previous visit (from the past 48 hour(s)). No results found.  VITALS: BP 144/82 mmHg  Pulse 85  Ht 5\' 8"  (1.727 m)  Wt 199 lb (90.266 kg)  BMI 30.26 kg/m2  EXAM: Deferred  EKG: Atrial fibrillation and 91  ASSESSMENT: 1. Paroxysmal atrial flutter and fibrillation, with controlled ventricular response - CHADS2VASC score of 2 2. Hypertension 3. Dyslipidemia  PLAN: 1.   [name] has been having both atrial fibrillation and atrial flutter. Currently he is in persistent atrial fibrillation. We discussed cardioversion he's willing to proceed. He will need to take a few more doses of Eliquis to have 4 weeks of anticoagulation. We'll try to schedule between Christmas and New Year's of possible for elective cardioversion and plan follow-up in the new year. Reminded that he cannot interrupt Eliquis therapy between now and the cardioversion.  Pixie Casino, MD, [facility] Attending  Cardiologist [place] C Kaiyana Bedore 05/29/2015, 9:19 AM

## 2015-06-01 ENCOUNTER — Ambulatory Visit
Admission: RE | Admit: 2015-06-01 | Discharge: 2015-06-01 | Disposition: A | Discharge status: Home / Self Care | Payer: PPO | Source: Ambulatory Visit | Attending: Internal Medicine | Admitting: Internal Medicine

## 2015-06-01 DIAGNOSIS — I4891 Unspecified atrial fibrillation: Secondary | ICD-10-CM

## 2015-06-01 DIAGNOSIS — Z01812 Encounter for preprocedural laboratory examination: Secondary | ICD-10-CM

## 2015-06-01 LAB — CBC
HCT: 49.3 % (ref 39.0–52.0)
Hemoglobin: 17.2 g/dL — ABNORMAL HIGH (ref 13.0–17.0)
MCH: 33.9 pg (ref 26.0–34.0)
MCHC: 34.9 g/dL (ref 30.0–36.0)
MCV: 97 fL (ref 78.0–100.0)
MPV: 9.3 fL (ref 8.6–12.4)
Platelets: 279 10*3/uL (ref 150–400)
RBC: 5.08 MIL/uL (ref 4.22–5.81)
RDW: 12.6 % (ref 11.5–15.5)
WBC: 6.2 10*3/uL (ref 4.0–10.5)

## 2015-06-01 LAB — PROTIME-INR
INR: 1.05 (ref <1.50)
Prothrombin Time: 13.8 seconds (ref 11.6–15.2)

## 2015-06-01 LAB — APTT: aPTT: 33 seconds (ref 24–37)

## 2015-06-02 LAB — BASIC METABOLIC PANEL
BUN: 16 mg/dL (ref 7–25)
CO2: 26 mmol/L (ref 20–31)
Calcium: 9.7 mg/dL (ref 8.6–10.3)
Chloride: 106 mmol/L (ref 98–110)
Creat: 1.38 mg/dL — ABNORMAL HIGH (ref 0.70–1.25)
Glucose, Bld: 86 mg/dL (ref 65–99)
Potassium: 4.7 mmol/L (ref 3.5–5.3)
Sodium: 142 mmol/L (ref 135–146)

## 2015-06-02 LAB — TSH: TSH: 2.38 u[IU]/mL (ref 0.350–4.500)

## 2015-06-07 ENCOUNTER — Other Ambulatory Visit: Payer: Self-pay

## 2015-06-07 DIAGNOSIS — I4891 Unspecified atrial fibrillation: Secondary | ICD-10-CM

## 2015-06-08 ENCOUNTER — Ambulatory Visit (HOSPITAL_COMMUNITY): Payer: PPO | Admitting: Anesthesiology

## 2015-06-08 ENCOUNTER — Encounter (HOSPITAL_COMMUNITY)
Admission: RE | Disposition: A | Discharge status: Home / Self Care | Payer: Self-pay | Source: Ambulatory Visit | Attending: Internal Medicine

## 2015-06-08 ENCOUNTER — Ambulatory Visit (HOSPITAL_COMMUNITY)
Admission: RE | Admit: 2015-06-08 | Discharge: 2015-06-08 | Disposition: A | Discharge status: Home / Self Care | Payer: PPO | Source: Ambulatory Visit | Attending: Internal Medicine | Admitting: Internal Medicine

## 2015-06-08 ENCOUNTER — Encounter (HOSPITAL_COMMUNITY): Payer: Self-pay | Admitting: *Deleted

## 2015-06-08 DIAGNOSIS — I4891 Unspecified atrial fibrillation: Secondary | ICD-10-CM | POA: Insufficient documentation

## 2015-06-08 DIAGNOSIS — Z87891 Personal history of nicotine dependence: Secondary | ICD-10-CM | POA: Diagnosis not present

## 2015-06-08 DIAGNOSIS — I4819 Other persistent atrial fibrillation: Secondary | ICD-10-CM | POA: Insufficient documentation

## 2015-06-08 DIAGNOSIS — I481 Persistent atrial fibrillation: Secondary | ICD-10-CM

## 2015-06-08 DIAGNOSIS — I10 Essential (primary) hypertension: Secondary | ICD-10-CM | POA: Diagnosis not present

## 2015-06-08 HISTORY — PX: CARDIOVERSION: SHX1299

## 2015-06-08 SURGERY — CARDIOVERSION
Anesthesia: General

## 2015-06-08 MED ORDER — SODIUM CHLORIDE 0.9 % IV SOLN
INTRAVENOUS | Status: DC | PRN
  Administered 2015-06-08: 12:00:00 via INTRAVENOUS

## 2015-06-08 MED ORDER — LIDOCAINE HCL (CARDIAC) 20 MG/ML IV SOLN
INTRAVENOUS | Status: DC | PRN
  Administered 2015-06-08: 50 mg via INTRAVENOUS

## 2015-06-08 MED ORDER — PROPOFOL 10 MG/ML IV BOLUS
INTRAVENOUS | Status: DC | PRN
  Administered 2015-06-08: 10 mg via INTRAVENOUS
  Administered 2015-06-08 (×5): 20 mg via INTRAVENOUS

## 2015-06-08 NOTE — Anesthesia Procedure Notes (Signed)
Date/Time: 06/08/2015 12:02 PM Performed by: Layla Maw Pre-anesthesia Checklist: Patient identified, Emergency Drugs available, Suction available, Patient being monitored and Timeout performed Patient Re-evaluated:Patient Re-evaluated prior to inductionOxygen Delivery Method: Ambu bag Preoxygenation: Pre-oxygenation with 100% oxygen Intubation Type: IV induction Number of attempts: 1 Dental Injury: Teeth and Oropharynx as per pre-operative assessment

## 2015-06-08 NOTE — Transfer of Care (Signed)
Immediate Anesthesia Transfer of Care Note  Patient: Steve Weaver  Procedure(s) Performed: Procedure(s): CARDIOVERSION (N/A)  Patient Location: PACU and Endoscopy Unit  Anesthesia Type:General  Level of Consciousness: awake, alert , oriented and patient cooperative  Airway & Oxygen Therapy: Patient Spontanous Breathing and Patient connected to nasal cannula oxygen  Post-op Assessment: Report given to RN, Post -op Vital signs reviewed and stable and Patient moving all extremities X 4  Post vital signs: Reviewed and stable  Last Vitals:  Filed Vitals:   06/08/15 1211 06/08/15 1213  BP: 133/94 132/97  Pulse:    Temp:    Resp:      Complications: No apparent anesthesia complications

## 2015-06-08 NOTE — CV Procedure (Signed)
    CARDIOVERSION NOTE  Procedure: Electrical Cardioversion Indications:  Atrial Fibrillation  Procedure Details:  Consent: Risks of procedure as well as the alternatives and risks of each were explained to the (patient/caregiver).  Consent for procedure obtained.  Time Out: Verified patient identification, verified procedure, site/side was marked, verified correct patient position, special equipment/implants available, medications/allergies/relevent history reviewed, required imaging and test results available.  Performed  Patient placed on cardiac monitor, pulse oximetry, supplemental oxygen as necessary.  Sedation given: Propofol per anesthesia Pacer pads placed anterior and posterior chest.  Cardioverted 2 time(s).  Cardioverted at 150J and 200J.  Impression: Findings: Post procedure EKG shows: NSR, which degraded back to a-fib after 2-3 minutes Complications: None Patient did tolerate procedure well.  Plan: 1. Successful DCCV to NSR, however, patient then went back into a-fib after several minutes. I discussed options including rate control versus adding antiarrythmic therapy with him and his wife.  Fatigue may be his symptom related to a-fib. His last stress test was in 2014. I recommend a lexiscan myoview since he is in a-fib. If that is negative, then would start flecainide 50 mg BID. Plan for re-evaluation in 1 month. Continue Eliquis. Possible repeat DCCV if he remains in a-fib.  Time Spent Directly with the Patient:  30 minutes   Pixie Casino, MD, [facility] Attending Cardiologist [place] 06/08/2015, 12:49 PM

## 2015-06-08 NOTE — H&P (Signed)
    INTERVAL PROCEDURE H&P  History and Physical Interval Note:  06/08/2015 11:18 AM  Steve Weaver has presented today for their planned procedure. The various methods of treatment have been discussed with the patient and family. After consideration of risks, benefits and other options for treatment, the patient has consented to the procedure.  The patients' outpatient history has been reviewed, patient examined, and no change in status from most recent office note within the past 30 days. I have reviewed the patients' chart and labs and will proceed as planned. Questions were answered to the patient's satisfaction.   Pixie Casino, MD, [facility] Attending Cardiologist [facility]  Nadean Corwin Armetta Henri 06/08/2015, 11:18 AM

## 2015-06-08 NOTE — Anesthesia Preprocedure Evaluation (Addendum)
Anesthesia Evaluation  Patient identified by MRN, date of birth, ID band Patient awake    Reviewed: Allergy & Precautions, NPO status , Patient's Chart, lab work & pertinent test results, reviewed documented beta blocker date and time   History of Anesthesia Complications Negative for: history of anesthetic complications  Airway Mallampati: IV   Neck ROM: Full  Mouth opening: Limited Mouth Opening  Dental  (+) Poor Dentition, Dental Advisory Given   Pulmonary former smoker,    Pulmonary exam normal breath sounds clear to auscultation       Cardiovascular Exercise Tolerance: Poor hypertension, Pt. on medications and Pt. on home beta blockers + dysrhythmias Atrial Fibrillation  Rhythm:Irregular     Neuro/Psych negative neurological ROS  negative psych ROS   GI/Hepatic negative GI ROS, Neg liver ROS,   Endo/Other  negative endocrine ROS  Renal/GU negative Renal ROS     Musculoskeletal   Abdominal (+) + obese,  Abdomen: soft. Bowel sounds: normal.  Peds  Hematology negative hematology ROS (+)   Anesthesia Other Findings   Reproductive/Obstetrics                         EKG 05/29/15: a-fib BP Readings from Last 3 Encounters:  05/29/15 144/82  05/01/15 130/92  05/02/14 150/82   Lab Results  Component Value Date   WBC 6.2 06/01/2015   HGB 17.2* 06/01/2015   HCT 49.3 06/01/2015   MCV 97.0 06/01/2015   PLT 279 06/01/2015     Chemistry      Component Value Date/Time   NA 142 06/01/2015 1007   K 4.7 06/01/2015 1007   CL 106 06/01/2015 1007   CO2 26 06/01/2015 1007   BUN 16 06/01/2015 1007   CREATININE 1.38* 06/01/2015 1007      Component Value Date/Time   CALCIUM 9.7 06/01/2015 1007   ALKPHOS 85 05/10/2015 0941   AST 26 05/10/2015 0941   ALT 40 05/10/2015 0941   BILITOT 0.7 05/10/2015 0941     Lab Results  Component Value Date   INR 1.05 06/01/2015    Anesthesia  Physical Anesthesia Plan  ASA: II  Anesthesia Plan: General   Post-op Pain Management:    Induction: Intravenous  Airway Management Planned: Natural Airway and Mask  Additional Equipment:   Intra-op Plan:   Post-operative Plan:   Informed Consent: I have reviewed the patients History and Physical, chart, labs and discussed the procedure including the risks, benefits and alternatives for the proposed anesthesia with the patient or authorized representative who has indicated his/her understanding and acceptance.   Dental advisory given  Plan Discussed with: CRNA, Anesthesiologist and Surgeon  Anesthesia Plan Comments:         Anesthesia Quick Evaluation

## 2015-06-08 NOTE — Anesthesia Postprocedure Evaluation (Signed)
Anesthesia Post Note  Patient: Steve Weaver  Procedure(s) Performed: Procedure(s) (LRB): CARDIOVERSION (N/A)  Patient location during evaluation: PACU Anesthesia Type: MAC Level of consciousness: awake and alert Pain management: pain level controlled Vital Signs Assessment: post-procedure vital signs reviewed and stable Respiratory status: spontaneous breathing, nonlabored ventilation, respiratory function stable and patient connected to nasal cannula oxygen Cardiovascular status: blood pressure returned to baseline and stable Postop Assessment: no signs of nausea or vomiting Anesthetic complications: no    Last Vitals:  Filed Vitals:   06/08/15 1238 06/08/15 1249  BP: 168/100 142/98  Pulse:    Temp: 36.6 C   Resp: 16 12    Last Pain: There were no vitals filed for this visit.               Praise Dolecki JENNETTE

## 2015-06-08 NOTE — Discharge Instructions (Signed)
Electrical Cardioversion, Care After °Refer to this sheet in the next few weeks. These instructions provide you with information on caring for yourself after your procedure. Your health care provider may also give you more specific instructions. Your treatment has been planned according to current medical practices, but problems sometimes occur. Call your health care provider if you have any problems or questions after your procedure. °WHAT TO EXPECT AFTER THE PROCEDURE °After your procedure, it is typical to have the following sensations: °· Some redness on the skin where the shocks were delivered. If this is tender, a sunburn lotion or hydrocortisone cream may help. °· Possible return of an abnormal heart rhythm within hours or days after the procedure. °HOME CARE INSTRUCTIONS °· Take medicines only as directed by your health care provider. Be sure you understand how and when to take your medicine. °· Learn how to feel your pulse and check it often. °· Limit your activity for 48 hours after the procedure or as directed by your health care provider. °· Avoid or minimize caffeine and other stimulants as directed by your health care provider. °SEEK MEDICAL CARE IF: °· You feel like your heart is beating too fast or your pulse is not regular. °· You have any questions about your medicines. °· You have bleeding that will not stop. °SEEK IMMEDIATE MEDICAL CARE IF: °· You are dizzy or feel faint. °· It is hard to breathe or you feel short of breath. °· There is a change in discomfort in your chest. °· Your speech is slurred or you have trouble moving an arm or leg on one side of your body. °· You get a serious muscle cramp that does not go away. °· Your fingers or toes turn cold or blue. °  °This information is not intended to replace advice given to you by your health care provider. Make sure you discuss any questions you have with your health care provider. °  °Document Released: 03/17/2013 Document Revised: 06/17/2014  Document Reviewed: 03/17/2013 °Elsevier Interactive Patient Education ©2016 Elsevier Inc[employer] ° ° °Monitored Anesthesia Care °Monitored anesthesia care is an anesthesia service for a medical procedure. Anesthesia is the loss of the ability to feel pain. It is produced by medicines called anesthetics. It may affect a small area of your body (local anesthesia), a large area of your body (regional anesthesia), or your entire body (general anesthesia). The need for monitored anesthesia care depends your procedure, your condition, and the potential need for regional or general anesthesia. It is often provided during procedures where:  °· General anesthesia may be needed if there are complications. This is because you need special care when you are under general anesthesia.   °· You will be under local or regional anesthesia. This is so that you are able to have higher levels of anesthesia if needed.   °· You will receive calming medicines (sedatives). This is especially the case if sedatives are given to put you in a semi-conscious state of relaxation (deep sedation). This is because the amount of sedative needed to produce this state can be hard to predict. Too much of a sedative can produce general anesthesia. °Monitored anesthesia care is performed by one or more health care providers who have special training in all types of anesthesia. You will need to meet with these health care providers before your procedure. During this meeting, they will ask you about your medical history. They will also give you instructions to follow. (For example, you will need to stop eating   before your procedure. You may also need to stop or change medicines you are taking.) During your procedure, your health care providers will stay with you. They will:   Watch your condition. This includes watching your blood pressure, breathing, and level of pain.   Diagnose and treat problems that occur.   Give medicines if they are  needed. These may include calming medicines (sedatives) and anesthetics.   Make sure you are comfortable.  Having monitored anesthesia care does not necessarily mean that you will be under anesthesia. It does mean that your health care providers will be able to manage anesthesia if you need it or if it occurs. It also means that you will be able to have a different type of anesthesia than you are having if you need it. When your procedure is complete, your health care providers will continue to watch your condition. They will make sure any medicines wear off before you are allowed to go home.    This information is not intended to replace advice given to you by your health care provider. Make sure you discuss any questions you have with your health care provider.   Document Released: 02/20/2005 Document Revised: 06/17/2014 Document Reviewed: 07/08/2012 Elsevier Interactive Patient Education 2016 [employer]. Electrical Cardioversion, Care After Refer to this sheet in the next few weeks. These instructions provide you with information on caring for yourself after your procedure. Your health care provider may also give you more specific instructions. Your treatment has been planned according to current medical practices, but problems sometimes occur. Call your health care provider if you have any problems or questions after your procedure. WHAT TO EXPECT AFTER THE PROCEDURE After your procedure, it is typical to have the following sensations:  Some redness on the skin where the shocks were delivered. If this is tender, a sunburn lotion or hydrocortisone cream may help.  Possible return of an abnormal heart rhythm within hours or days after the procedure. HOME CARE INSTRUCTIONS  Take medicines only as directed by your health care provider. Be sure you understand how and when to take your medicine.  Learn how to feel your pulse and check it often.  Limit your activity for 48 hours after the  procedure or as directed by your health care provider.  Avoid or minimize caffeine and other stimulants as directed by your health care provider. SEEK MEDICAL CARE IF:  You feel like your heart is beating too fast or your pulse is not regular.  You have any questions about your medicines.  You have bleeding that will not stop. SEEK IMMEDIATE MEDICAL CARE IF:  You are dizzy or feel faint.  It is hard to breathe or you feel short of breath.  There is a change in discomfort in your chest.  Your speech is slurred or you have trouble moving an arm or leg on one side of your body.  You get a serious muscle cramp that does not go away.  Your fingers or toes turn cold or blue.   This information is not intended to replace advice given to you by your health care provider. Make sure you discuss any questions you have with your health care provider.   Document Released: 03/17/2013 Document Revised: 06/17/2014 Document Reviewed: 03/17/2013 Elsevier Interactive Patient Education [employer].

## 2015-06-08 NOTE — Progress Notes (Signed)
287mL of NS administered with procedure. Richardean Sale, RN

## 2015-06-09 ENCOUNTER — Encounter (HOSPITAL_COMMUNITY): Payer: Self-pay | Admitting: Internal Medicine

## 2015-06-21 ENCOUNTER — Telehealth (HOSPITAL_COMMUNITY): Payer: Self-pay | Admitting: *Deleted

## 2015-06-21 NOTE — Telephone Encounter (Signed)
Patient given detailed instructions per Myocardial Perfusion Study Information Sheet for the test on 06/23/15 at 11:00. Patient notified to arrive 15 minutes early and that it is imperative to arrive on time for appointment to keep from having the test rescheduled.  If you need to cancel or reschedule your appointment, please call the office within 24 hours of your appointment. Failure to do so may result in a cancellation of your appointment, and a $50 no show fee. Patient verbalized understanding.Veronia Beets

## 2015-06-23 ENCOUNTER — Ambulatory Visit (HOSPITAL_COMMUNITY): Payer: PPO | Attending: Cardiovascular Disease

## 2015-06-23 DIAGNOSIS — I4891 Unspecified atrial fibrillation: Secondary | ICD-10-CM | POA: Diagnosis not present

## 2015-06-23 DIAGNOSIS — R5383 Other fatigue: Secondary | ICD-10-CM | POA: Diagnosis not present

## 2015-06-23 DIAGNOSIS — R002 Palpitations: Secondary | ICD-10-CM | POA: Insufficient documentation

## 2015-06-23 DIAGNOSIS — I481 Persistent atrial fibrillation: Secondary | ICD-10-CM | POA: Diagnosis not present

## 2015-06-23 DIAGNOSIS — I10 Essential (primary) hypertension: Secondary | ICD-10-CM | POA: Insufficient documentation

## 2015-06-23 DIAGNOSIS — R079 Chest pain, unspecified: Secondary | ICD-10-CM | POA: Diagnosis not present

## 2015-06-23 DIAGNOSIS — I4819 Other persistent atrial fibrillation: Secondary | ICD-10-CM

## 2015-06-23 LAB — MYOCARDIAL PERFUSION IMAGING
LV dias vol: 90 mL
LV sys vol: 1 mL
Peak HR: 162 {beats}/min
RATE: 0.29
Rest HR: 97 {beats}/min
SDS: 1
SRS: 0
SSS: 1
TID: 0.87

## 2015-06-23 MED ORDER — TECHNETIUM TC 99M SESTAMIBI GENERIC - CARDIOLITE
10.2000 | Freq: Once | INTRAVENOUS | Status: AC | PRN
  Administered 2015-06-23: 10 via INTRAVENOUS

## 2015-06-23 MED ORDER — TECHNETIUM TC 99M SESTAMIBI GENERIC - CARDIOLITE
31.6000 | Freq: Once | INTRAVENOUS | Status: AC | PRN
  Administered 2015-06-23: 32 via INTRAVENOUS

## 2015-06-23 MED ORDER — REGADENOSON 0.4 MG/5ML IV SOLN
0.4000 mg | Freq: Once | INTRAVENOUS | Status: AC
  Administered 2015-06-23: 0.4 mg via INTRAVENOUS

## 2015-07-24 ENCOUNTER — Telehealth: Payer: Self-pay | Admitting: Internal Medicine

## 2015-07-24 DIAGNOSIS — R9439 Abnormal result of other cardiovascular function study: Secondary | ICD-10-CM

## 2015-07-24 DIAGNOSIS — I4819 Other persistent atrial fibrillation: Secondary | ICD-10-CM

## 2015-07-24 NOTE — Telephone Encounter (Signed)
ECHO SCHEDULE AND SENT TO  SCHEDULER

## 2015-07-24 NOTE — Telephone Encounter (Signed)
New message     Pt had a cardioversion in December.  He also had a stress test in January.  Pt states that he is still in AFIB. Someone was to call him and possible reschedule another cardioversion---but no one had follow up with him.  Please call

## 2015-07-24 NOTE — Telephone Encounter (Signed)
Spoke to patient  patient had cardioversion in 05/2015 -  myoview  Was done 06/23/15 Awaiting for results,POSSIBLE STARTING A NEW MEDICATIONS  Appointment set up - August 09 2015 11:45 am with Dr Debara Pickett   patient states he would not be able to come to appointment  starting a new job that week  Patient aware will defer to Dr Debara Pickett  and contact him

## 2015-07-24 NOTE — Telephone Encounter (Signed)
Please order an echo to re-assess LVEF - was 49% on nuclear.  DR. Lemmie Evens

## 2015-08-07 ENCOUNTER — Ambulatory Visit (HOSPITAL_COMMUNITY): Payer: PPO | Attending: Internal Medicine

## 2015-08-07 ENCOUNTER — Other Ambulatory Visit: Payer: Self-pay

## 2015-08-07 DIAGNOSIS — I119 Hypertensive heart disease without heart failure: Secondary | ICD-10-CM | POA: Insufficient documentation

## 2015-08-07 DIAGNOSIS — I4819 Other persistent atrial fibrillation: Secondary | ICD-10-CM

## 2015-08-07 DIAGNOSIS — R9439 Abnormal result of other cardiovascular function study: Secondary | ICD-10-CM | POA: Diagnosis not present

## 2015-08-07 DIAGNOSIS — I481 Persistent atrial fibrillation: Secondary | ICD-10-CM | POA: Diagnosis not present

## 2015-08-07 DIAGNOSIS — E785 Hyperlipidemia, unspecified: Secondary | ICD-10-CM | POA: Diagnosis not present

## 2015-08-07 DIAGNOSIS — I351 Nonrheumatic aortic (valve) insufficiency: Secondary | ICD-10-CM | POA: Insufficient documentation

## 2015-08-07 DIAGNOSIS — I4891 Unspecified atrial fibrillation: Secondary | ICD-10-CM | POA: Diagnosis not present

## 2015-08-07 DIAGNOSIS — I340 Nonrheumatic mitral (valve) insufficiency: Secondary | ICD-10-CM | POA: Diagnosis not present

## 2015-08-09 ENCOUNTER — Encounter: Payer: Self-pay | Admitting: Internal Medicine

## 2015-08-09 ENCOUNTER — Ambulatory Visit (INDEPENDENT_AMBULATORY_CARE_PROVIDER_SITE_OTHER): Payer: PPO | Admitting: Internal Medicine

## 2015-08-09 VITALS — BP 142/90 | HR 90 | Ht 68.0 in | Wt 204.0 lb

## 2015-08-09 DIAGNOSIS — D689 Coagulation defect, unspecified: Secondary | ICD-10-CM

## 2015-08-09 DIAGNOSIS — Z01812 Encounter for preprocedural laboratory examination: Secondary | ICD-10-CM

## 2015-08-09 DIAGNOSIS — I481 Persistent atrial fibrillation: Secondary | ICD-10-CM | POA: Diagnosis not present

## 2015-08-09 DIAGNOSIS — I10 Essential (primary) hypertension: Secondary | ICD-10-CM

## 2015-08-09 DIAGNOSIS — Z79899 Other long term (current) drug therapy: Secondary | ICD-10-CM

## 2015-08-09 DIAGNOSIS — R5383 Other fatigue: Secondary | ICD-10-CM | POA: Diagnosis not present

## 2015-08-09 DIAGNOSIS — I4819 Other persistent atrial fibrillation: Secondary | ICD-10-CM

## 2015-08-09 DIAGNOSIS — I4892 Unspecified atrial flutter: Secondary | ICD-10-CM

## 2015-08-09 MED ORDER — FLECAINIDE ACETATE 50 MG PO TABS: 50.0000 mg | ORAL_TABLET | Freq: Two times a day (BID) | ORAL | Status: DC

## 2015-08-09 NOTE — Patient Instructions (Addendum)
Your physician has recommended you make the following change in your medication: START flecainide 50mg  twice daily  Please schedule a nurse visit for EKG check on Tuesday  Your physician has recommended that you have a Cardioversion (DCCV) for 2-3 weeks from 08/09/2015. Electrical Cardioversion uses a jolt of electricity to your heart either through paddles or wired patches attached to your chest. This is a controlled, usually prescheduled, procedure. Defibrillation is done under light anesthesia in the hospital, and you usually go home the day of the procedure. This is done to get your heart back into a normal rhythm. You are not awake for the procedure. Please see the instruction sheet given to you today. -- you will need to have blood work done [place] prior to the procedure  Your physician recommends that you schedule a follow-up appointment after your cardioversion.

## 2015-08-10 NOTE — Progress Notes (Signed)
OFFICE NOTE  Chief Complaint:  Follow-up atrial fibrillation  Primary Care Physician: Andria Frames, MD  HPI:  Steve Weaver is a pleasant 65 year old male who has been experiencing some increasing fatigue with exertion and decreased exercise tolerance over the past several years. 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 He has since struggled, working a number of jobs in order to make ends meet.  He used to be very physically active however recently has stopped exercising and has had about 20 pounds of weight gain. He found that he's been more fatigued during certain activities for example planting some bushes in the yard caused him more shortness of breath and fatigue that he was typically use to. He was recently seen for a well check by his primary care provider, including followup on his hypertension and dyslipidemia. He was noted to have an irregular heartbeat an EKG performed apparently showed premature ventricular contractions. I subsequently received the EKG which shows premature atrial contractions. An EKG performed in our office today also shows PACs, for which she is not completely aware of and it is unclear whether this is new or old.  He denies any specific angina.  [name] underwent an echocardiogram and nuclear stress test in our office at the end of October and early November. The echocardiogram demonstrated normal systolic function with mild aortic insufficiency and mild mitral insufficiency. There was borderline right atrial enlargement.  He was reportedly very nervous about a stress test and at the onset was noted to be in a rapid heart rate of 150 which was regular and there were clear flutter waves. This terminated spontaneously. Perfusion imaging did not demonstrate any defects.  [name] stopped his lisinopril due to what he felt was low blood pressures. He brought in a list of his blood  pressures today and 8 do seem to be pretty well controlled at home. He reports some of his symptoms have improved. He denies any chest pain or significant recurrent palpitations.  I saw [name] back today in the office. He reports no complaints. He denies any chest pain, shortness of breath or worsening fatigue. Blood pressure was mildly elevated today 130/92. EKG shows atrial flutter with variable ventricular response at 119. He is completely unaware of this. He does have a history of paroxysmal atrial flutter in the past but it seems to be remotely. He is now 66 inches CHADSVASC score is 2. He is currently on aspirin only.  [name] for follow-up of atrial fibrillation. Unfortunately his cardioversion was not successful. We did have him undergo nuclear stress testing which showed no evidence of ischemia although the study was gated while he was in A. fib and his EF was reduced. I suspect this is a gating abnormality. We confirmed normal LV function by echo, however did show mild AI and MR. These findings are considered insignificant when contemplating using a 1C antiarrhythmic. We discussed options and I feel that our best chance of success in achieving sinus rhythm would be with antiarrhythmic therapy. I would recommend he start on flecainide 50 mg twice a day. He should continue Eliquis. He will need an EKG in 1 week after starting flecainide.  PMHx:  Past Medical History  Diagnosis Date  . Hyperlipidemia   . Hypertension   . PAF (paroxysmal atrial fibrillation) [facility])     Past Surgical History  Procedure Laterality Date  . Broke  leg  11/1998  . Nm myocar perf wall motion  05/04/2013    bruce myoview - normal stress nuclear study; a-flutter & a-fib with RVR, EF 59%  . Cardioversion N/A 06/08/2015    Procedure: CARDIOVERSION;  Surgeon: Pixie Casino, MD;  Location: [facility] ENDOSCOPY;  Service: Cardiovascular;  Laterality: N/A;    FAMHx:  Family History  Problem Relation Age of  Onset  . Anemia Neg Hx   . Arrhythmia Neg Hx   . Asthma Neg Hx   . Clotting disorder Neg Hx   . Fainting Neg Hx   . Heart attack Neg Hx   . Heart disease Neg Hx   . Heart failure Neg Hx   . Hyperlipidemia Mother   . Hypertension Mother   . Emphysema Mother   . Diabetes Father   . Dementia Father     SOCHx:   reports that he has quit smoking. His smoking use included Cigars. He has never used smokeless tobacco. He reports that he drinks about 3.6 oz of alcohol per week. He reports that he does not use illicit drugs.  ALLERGIES:  Allergies  Allergen Reactions  . Other Rash    Muscle Relaxer     ROS: A comprehensive review of systems was negative.  HOME MEDS: Current Outpatient Prescriptions  Medication Sig Dispense Refill  . apixaban (ELIQUIS) 5 MG TABS tablet Take 1 tablet (5 mg total) by mouth 2 (two) times daily. 60 tablet 6  . diltiazem (DILACOR XR) 120 MG 24 hr capsule Take 1 capsule (120 mg total) by mouth daily. 30 capsule 5  . lovastatin (MEVACOR) 40 MG tablet TAKE 1 TABLET (40 MG TOTAL) BY MOUTH DAILY. 90 tablet 3  . metoprolol tartrate (LOPRESSOR) 25 MG tablet Take 1 tablet (25 mg total) by mouth 2 (two) times daily. 180 tablet 3  . Multiple Vitamin (MULTIVITAMIN) capsule Take 1 capsule by mouth daily.    . Omega-3 Fatty Acids (FISH OIL) 1200 MG CAPS Take 2,400 mg by mouth daily.    . flecainide (TAMBOCOR) 50 MG tablet Take 1 tablet (50 mg total) by mouth 2 (two) times daily. 180 tablet 3   No current facility-administered medications for this visit.    LABS/IMAGING: No results found for this or any previous visit (from the past 48 hour(s)). No results found.  VITALS: BP 142/90 mmHg  Pulse 90  Ht 5\' 8"  (1.727 m)  Wt 204 lb (92.534 kg)  BMI 31.03 kg/m2  EXAM: Gen.: Awake, in no apparent distress HEENT: PERRLA, EOMI Lungs: Clear to auscultation bilaterally Cardiovascular: Irregularly irregular with rapid response  Abdomen: Soft, nontender, positive  bowel sounds Extremity: No edema Neurologic: No gross deficits Psychiatric: Mood, affect normal Skin: No rashes  EKG: Atrial flutter with variable AV block at 90  ASSESSMENT: 1. Paroxysmal atrial fibrillation, with rapid ventricular response - CHADS2VASC score of 2 2. Hypertension 3. Dyslipidemia  PLAN: 1.   [name] appears to be in atrial fibrillation today but did have atrial flutter. He did have a brief period of sinus rhythm after cardioversion and likely his cardioversion essentially interrupted his flutter circuit but left him with recurrent atrial fibrillation. Nonetheless, he remains fatigued and symptomatic. I think he would benefit from antiarrhythmic therapy as mentioned above. We'll go ahead and start flecainide and plan a probable cardioversion attempt in 3 weeks.  Pixie Casino, MD, [facility] Attending Cardiologist [place] [facility] 08/10/2015, 8:08 AM

## 2015-08-14 ENCOUNTER — Telehealth: Payer: Self-pay | Admitting: *Deleted

## 2015-08-14 NOTE — Telephone Encounter (Signed)
Called patient to see if her was ready to schedule cardioversion.  He states he is coming in tomorrow for an EKG and has some questions.  He will let me know tomorrow when to schedule his procedure.

## 2015-08-15 ENCOUNTER — Ambulatory Visit (INDEPENDENT_AMBULATORY_CARE_PROVIDER_SITE_OTHER): Payer: PPO | Admitting: *Deleted

## 2015-08-15 VITALS — HR 83

## 2015-08-15 DIAGNOSIS — I4891 Unspecified atrial fibrillation: Secondary | ICD-10-CM

## 2015-08-15 NOTE — Progress Notes (Signed)
1.) Reason for visit: ekg  2.) Name of MD requesting visit: dr hilty  3.) H&P: pt recently started on flecainide for atrial fib. This is the one week ECG check.  4.) ROS related to problem: fatigue faster than usual.  5.) Assessment and plan per MD: ECG reviewed by dr berry ([place]), pt is in atrial fib. No changes made.

## 2015-09-06 ENCOUNTER — Telehealth: Payer: Self-pay | Admitting: *Deleted

## 2015-09-06 ENCOUNTER — Encounter: Payer: Self-pay | Admitting: Internal Medicine

## 2015-09-06 NOTE — Telephone Encounter (Signed)
Spoke with patient regarding appointment for cardioversion ordered by Dr. Debara Pickett.    Scheduled for Monday 09/11/15 at  10:00 am--arrive at Short Stay center at 9:00 am-NPO after midnight--pre procedure labs tomorrow 09/07/15.  Patient voiced his understanding.

## 2015-09-07 ENCOUNTER — Other Ambulatory Visit: Payer: Self-pay | Admitting: *Deleted

## 2015-09-07 DIAGNOSIS — Z01812 Encounter for preprocedural laboratory examination: Secondary | ICD-10-CM | POA: Diagnosis not present

## 2015-09-07 DIAGNOSIS — D689 Coagulation defect, unspecified: Secondary | ICD-10-CM | POA: Diagnosis not present

## 2015-09-07 DIAGNOSIS — R5383 Other fatigue: Secondary | ICD-10-CM | POA: Diagnosis not present

## 2015-09-07 DIAGNOSIS — I4819 Other persistent atrial fibrillation: Secondary | ICD-10-CM

## 2015-09-07 DIAGNOSIS — Z79899 Other long term (current) drug therapy: Secondary | ICD-10-CM | POA: Diagnosis not present

## 2015-09-07 LAB — CBC
HCT: 46.9 % (ref 39.0–52.0)
Hemoglobin: 16.7 g/dL (ref 13.0–17.0)
MCH: 34.1 pg — ABNORMAL HIGH (ref 26.0–34.0)
MCHC: 35.6 g/dL (ref 30.0–36.0)
MCV: 95.7 fL (ref 78.0–100.0)
MPV: 9 fL (ref 8.6–12.4)
Platelets: 257 10*3/uL (ref 150–400)
RBC: 4.9 MIL/uL (ref 4.22–5.81)
RDW: 13.1 % (ref 11.5–15.5)
WBC: 5.9 10*3/uL (ref 4.0–10.5)

## 2015-09-07 LAB — BASIC METABOLIC PANEL
BUN: 19 mg/dL (ref 7–25)
CO2: 22 mmol/L (ref 20–31)
Calcium: 9.6 mg/dL (ref 8.6–10.3)
Chloride: 106 mmol/L (ref 98–110)
Creat: 1.49 mg/dL — ABNORMAL HIGH (ref 0.70–1.25)
Glucose, Bld: 90 mg/dL (ref 65–99)
Potassium: 4.7 mmol/L (ref 3.5–5.3)
Sodium: 140 mmol/L (ref 135–146)

## 2015-09-07 LAB — PROTIME-INR
INR: 1.26 (ref <1.50)
Prothrombin Time: 16 seconds — ABNORMAL HIGH (ref 11.6–15.2)

## 2015-09-07 LAB — APTT: aPTT: 33 seconds (ref 24–37)

## 2015-09-07 LAB — TSH: TSH: 2.47 mIU/L (ref 0.40–4.50)

## 2015-09-10 MED ORDER — SODIUM CHLORIDE 0.9 % IV SOLN
INTRAVENOUS | Status: DC
  Administered 2015-09-11: 10:00:00 via INTRAVENOUS

## 2015-09-11 ENCOUNTER — Ambulatory Visit (HOSPITAL_COMMUNITY): Payer: PPO | Admitting: Anesthesiology

## 2015-09-11 ENCOUNTER — Ambulatory Visit (HOSPITAL_COMMUNITY)
Admission: RE | Admit: 2015-09-11 | Discharge: 2015-09-11 | Disposition: A | Discharge status: Home / Self Care | Payer: PPO | Source: Ambulatory Visit | Attending: Cardiovascular Disease | Admitting: Cardiovascular Disease

## 2015-09-11 ENCOUNTER — Encounter (HOSPITAL_COMMUNITY)
Admission: RE | Disposition: A | Discharge status: Home / Self Care | Payer: Self-pay | Source: Ambulatory Visit | Attending: Cardiovascular Disease

## 2015-09-11 ENCOUNTER — Encounter (HOSPITAL_COMMUNITY): Payer: Self-pay | Admitting: Internal Medicine

## 2015-09-11 DIAGNOSIS — Z6830 Body mass index (BMI) 30.0-30.9, adult: Secondary | ICD-10-CM | POA: Insufficient documentation

## 2015-09-11 DIAGNOSIS — Z79899 Other long term (current) drug therapy: Secondary | ICD-10-CM | POA: Insufficient documentation

## 2015-09-11 DIAGNOSIS — I480 Paroxysmal atrial fibrillation: Secondary | ICD-10-CM | POA: Diagnosis not present

## 2015-09-11 DIAGNOSIS — I4891 Unspecified atrial fibrillation: Secondary | ICD-10-CM | POA: Diagnosis not present

## 2015-09-11 DIAGNOSIS — R5383 Other fatigue: Secondary | ICD-10-CM | POA: Diagnosis present

## 2015-09-11 DIAGNOSIS — I4819 Other persistent atrial fibrillation: Secondary | ICD-10-CM

## 2015-09-11 DIAGNOSIS — Z87891 Personal history of nicotine dependence: Secondary | ICD-10-CM | POA: Diagnosis not present

## 2015-09-11 DIAGNOSIS — E785 Hyperlipidemia, unspecified: Secondary | ICD-10-CM | POA: Diagnosis not present

## 2015-09-11 DIAGNOSIS — Z7901 Long term (current) use of anticoagulants: Secondary | ICD-10-CM | POA: Insufficient documentation

## 2015-09-11 DIAGNOSIS — Z7982 Long term (current) use of aspirin: Secondary | ICD-10-CM | POA: Insufficient documentation

## 2015-09-11 DIAGNOSIS — I4892 Unspecified atrial flutter: Secondary | ICD-10-CM | POA: Diagnosis not present

## 2015-09-11 DIAGNOSIS — I10 Essential (primary) hypertension: Secondary | ICD-10-CM | POA: Diagnosis not present

## 2015-09-11 HISTORY — PX: CARDIOVERSION: SHX1299

## 2015-09-11 SURGERY — CARDIOVERSION
Anesthesia: Monitor Anesthesia Care

## 2015-09-11 MED ORDER — PROPOFOL 10 MG/ML IV BOLUS
INTRAVENOUS | Status: DC | PRN
  Administered 2015-09-11: 70 mg via INTRAVENOUS

## 2015-09-11 MED ORDER — LIDOCAINE HCL (CARDIAC) 20 MG/ML IV SOLN
INTRAVENOUS | Status: DC | PRN
  Administered 2015-09-11: 60 mg via INTRATRACHEAL

## 2015-09-11 NOTE — H&P (Addendum)
Updated HISTORY & PHYSICAL   Chief Complaint:  Atrial fibrillation   Cardiologist: Dr. Debara Pickett  Primary Care Physician: Andria Frames, MD  HPI:  Steve Weaver is a pleasant 65 year old male who has been experiencing some increasing fatigue with exertion and decreased exercise tolerance over the past several years. 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 He has since struggled, working a number of jobs in order to make ends meet. He used to be very physically active however recently has stopped exercising and has had about 20 pounds of weight gain. He found that he's been more fatigued during certain activities for example planting some bushes in the yard caused him more shortness of breath and fatigue that he was typically use to. He was recently seen for a well check by his primary care provider, including followup on his hypertension and dyslipidemia. He was noted to have an irregular heartbeat an EKG performed apparently showed premature ventricular contractions. I subsequently received the EKG which shows premature atrial contractions. An EKG performed in our office today also shows PACs, for which she is not completely aware of and it is unclear whether this is new or old. He denies any specific angina.  [name] underwent an echocardiogram and nuclear stress test in our office at the end of October and early November. The echocardiogram demonstrated normal systolic function with mild aortic insufficiency and mild mitral insufficiency. There was borderline right atrial enlargement. He was reportedly very nervous about a stress test and at the onset was noted to be in a rapid heart rate of 150 which was regular and there were clear flutter waves. This terminated spontaneously. Perfusion imaging did not demonstrate any defects.  [name] stopped his lisinopril due to what he felt was low blood pressures. He  brought in a list of his blood pressures today and 8 do seem to be pretty well controlled at home. He reports some of his symptoms have improved. He denies any chest pain or significant recurrent palpitations.  I saw [name] back today in the office. He reports no complaints. He denies any chest pain, shortness of breath or worsening fatigue. Blood pressure was mildly elevated today 130/92. EKG shows atrial flutter with variable ventricular response at 119. He is completely unaware of this. He does have a history of paroxysmal atrial flutter in the past but it seems to be remotely. He is now 66 inches CHADSVASC score is 2. He is currently on aspirin only.  [name] for follow-up of atrial fibrillation. Unfortunately his cardioversion was not successful. We did have him undergo nuclear stress testing which showed no evidence of ischemia although the study was gated while he was in A. fib and his EF was reduced. I suspect this is a gating abnormality. We confirmed normal LV function by echo, however did show mild AI and MR. These findings are considered insignificant when contemplating using a 1C antiarrhythmic. We discussed options and I feel that our best chance of success in achieving sinus rhythm would be with antiarrhythmic therapy. I would recommend he start on flecainide 50 mg twice a day. He should continue Eliquis. He will need an EKG in 1 week after starting flecainide.  PMHx:  Past Medical History  Diagnosis Date  . Hyperlipidemia   . Hypertension   . PAF (paroxysmal atrial fibrillation) [facility])     Past Surgical History  Procedure Laterality Date  .  Broke leg  11/1998  . Nm myocar perf wall motion  05/04/2013    bruce myoview - normal stress nuclear study; a-flutter & a-fib with RVR, EF 59%  . Cardioversion N/A 06/08/2015    Procedure: CARDIOVERSION;  Surgeon: Pixie Casino, MD;  Location: [facility] ENDOSCOPY;  Service: Cardiovascular;  Laterality: N/A;    FAMHx:  Family  History  Problem Relation Age of Onset  . Anemia Neg Hx   . Arrhythmia Neg Hx   . Asthma Neg Hx   . Clotting disorder Neg Hx   . Fainting Neg Hx   . Heart attack Neg Hx   . Heart disease Neg Hx   . Heart failure Neg Hx   . Hyperlipidemia Mother   . Hypertension Mother   . Emphysema Mother   . Diabetes Father   . Dementia Father     SOCHx:   reports that he has quit smoking. His smoking use included Cigars. He has never used smokeless tobacco. He reports that he drinks about 3.6 oz of alcohol per week. He reports that he does not use illicit drugs.  ALLERGIES:  Allergies  Allergen Reactions  . Other Rash    Muscle Relaxer     ROS: Pertinent items noted in HPI and remainder of comprehensive ROS otherwise negative.  HOME MEDS:   Medication List    Notice    Cannot display discharge medications because the patient has not yet been admitted.      LABS/IMAGING: No results found for this or any previous visit (from the past 48 hour(s)). No results found.  VITALS: There were no vitals filed for this visit.  EXAM: General appearance: alert and no distress Lungs: clear to auscultation bilaterally Heart: irregularly irregular rhythm Extremities: extremities normal, atraumatic, no cyanosis or edema  IMPRESSION: Principal Problem:   Persistent atrial fibrillation (HCC) Active Problems:   Fatigue   Dyslipidemia   PLAN: [name] is scheduled for cardioversion today. This is an updated H&P as his last office visit was on 08/10/2015 (>30 days ago). There are no interval changes noted. He wishes to proceed with the case today.  Pixie Casino, MD, [facility] Attending Cardiologist [place] N[place] Purdy 09/11/2015, 8:31 AM

## 2015-09-11 NOTE — Anesthesia Postprocedure Evaluation (Signed)
Anesthesia Post Note  Patient: Steve Weaver  Procedure(s) Performed: Procedure(s) (LRB): CARDIOVERSION (N/A)  Patient location during evaluation: PACU Anesthesia Type: General Level of consciousness: awake and alert Pain management: pain level controlled Vital Signs Assessment: post-procedure vital signs reviewed and stable Respiratory status: spontaneous breathing, nonlabored ventilation, respiratory function stable and patient connected to nasal cannula oxygen Cardiovascular status: blood pressure returned to baseline and stable Postop Assessment: no signs of nausea or vomiting Anesthetic complications: no    Last Vitals:  Filed Vitals:   09/11/15 1050 09/11/15 1100  BP: 132/84 150/93  Pulse: 62 61  Temp:    Resp: 15 14    Last Pain: There were no vitals filed for this visit.               Tiajuana Amass

## 2015-09-11 NOTE — Anesthesia Procedure Notes (Signed)
Procedure Name: MAC Date/Time: 09/11/2015 10:25 AM Performed by: Jenne Campus Pre-anesthesia Checklist: Patient identified, Emergency Drugs available, Suction available, Patient being monitored and Timeout performed Patient Re-evaluated:Patient Re-evaluated prior to inductionOxygen Delivery Method: Ambu bag

## 2015-09-11 NOTE — Transfer of Care (Signed)
Immediate Anesthesia Transfer of Care Note  Patient: Steve Weaver  Procedure(s) Performed: Procedure(s): CARDIOVERSION (N/A)  Patient Location: Endoscopy Unit  Anesthesia Type:MAC  Level of Consciousness: awake, alert , oriented and patient cooperative  Airway & Oxygen Therapy: Patient Spontanous Breathing and Patient connected to nasal cannula oxygen  Post-op Assessment: Report given to RN and Post -op Vital signs reviewed and stable  Post vital signs: Reviewed  Last Vitals:  Filed Vitals:   09/11/15 0935 09/11/15 1036  BP: 148/78 123/71  Pulse:  60  Temp:    Resp:  22    Complications: No apparent anesthesia complications

## 2015-09-11 NOTE — Discharge Instructions (Signed)
Monitored Anesthesia Care °Monitored anesthesia care is an anesthesia service for a medical procedure. Anesthesia is the loss of the ability to feel pain. It is produced by medicines called anesthetics. It may affect a small area of your body (local anesthesia), a large area of your body (regional anesthesia), or your entire body (general anesthesia). The need for monitored anesthesia care depends your procedure, your condition, and the potential need for regional or general anesthesia. It is often provided during procedures where:  °· General anesthesia may be needed if there are complications. This is because you need special care when you are under general anesthesia.   °· You will be under local or regional anesthesia. This is so that you are able to have higher levels of anesthesia if needed.   °· You will receive calming medicines (sedatives). This is especially the case if sedatives are given to put you in a semi-conscious state of relaxation (deep sedation). This is because the amount of sedative needed to produce this state can be hard to predict. Too much of a sedative can produce general anesthesia. °Monitored anesthesia care is performed by one or more health care providers who have special training in all types of anesthesia. You will need to meet with these health care providers before your procedure. During this meeting, they will ask you about your medical history. They will also give you instructions to follow. (For example, you will need to stop eating and drinking before your procedure. You may also need to stop or change medicines you are taking.) During your procedure, your health care providers will stay with you. They will:  °· Watch your condition. This includes watching your blood pressure, breathing, and level of pain.   °· Diagnose and treat problems that occur.   °· Give medicines if they are needed. These may include calming medicines (sedatives) and anesthetics.   °· Make sure you are  comfortable.   °Having monitored anesthesia care does not necessarily mean that you will be under anesthesia. It does mean that your health care providers will be able to manage anesthesia if you need it or if it occurs. It also means that you will be able to have a different type of anesthesia than you are having if you need it. When your procedure is complete, your health care providers will continue to watch your condition. They will make sure any medicines wear off before you are allowed to go home.  °  °This information is not intended to replace advice given to you by your health care provider. Make sure you discuss any questions you have with your health care provider. °  °Document Released: 02/20/2005 Document Revised: 06/17/2014 Document Reviewed: 07/08/2012 °Elsevier Interactive Patient Education ©2016 [employer] °Electrical Cardioversion, Care After °Refer to this sheet in the next few weeks. These instructions provide you with information on caring for yourself after your procedure. Your health care provider may also give you more specific instructions. Your treatment has been planned according to current medical practices, but problems sometimes occur. Call your health care provider if you have any problems or questions after your procedure. °WHAT TO EXPECT AFTER THE PROCEDURE °After your procedure, it is typical to have the following sensations: °· Some redness on the skin where the shocks were delivered. If this is tender, a sunburn lotion or hydrocortisone cream may help. °· Possible return of an abnormal heart rhythm within hours or days after the procedure. °HOME CARE INSTRUCTIONS °· Take medicines only as directed by your health care provider.   Be sure you understand how and when to take your medicine. °· Learn how to feel your pulse and check it often. °· Limit your activity for 48 hours after the procedure or as directed by your health care provider. °· Avoid or minimize caffeine and other  stimulants as directed by your health care provider. °SEEK MEDICAL CARE IF: °· You feel like your heart is beating too fast or your pulse is not regular. °· You have any questions about your medicines. °· You have bleeding that will not stop. °SEEK IMMEDIATE MEDICAL CARE IF: °· You are dizzy or feel faint. °· It is hard to breathe or you feel short of breath. °· There is a change in discomfort in your chest. °· Your speech is slurred or you have trouble moving an arm or leg on one side of your body. °· You get a serious muscle cramp that does not go away. °· Your fingers or toes turn cold or blue. °  °This information is not intended to replace advice given to you by your health care provider. Make sure you discuss any questions you have with your health care provider. °  °Document Released: 03/17/2013 Document Revised: 06/17/2014 Document Reviewed: 03/17/2013 °Elsevier Interactive Patient Education ©2016 Elsevier Inc. ° °

## 2015-09-11 NOTE — Anesthesia Preprocedure Evaluation (Addendum)
Anesthesia Evaluation  Patient identified by MRN, date of birth, ID band Patient awake    Reviewed: Allergy & Precautions, NPO status , Patient's Chart, lab work & pertinent test results  History of Anesthesia Complications Negative for: history of anesthetic complications  Airway Mallampati: III  TM Distance: >3 FB Neck ROM: Full    Dental  (+) Teeth Intact, Dental Advisory Given   Pulmonary former smoker,    breath sounds clear to auscultation       Cardiovascular hypertension, Pt. on medications + dysrhythmias Atrial Fibrillation  Rhythm:Irregular Rate:Normal     Neuro/Psych negative neurological ROS  negative psych ROS   GI/Hepatic negative GI ROS, Neg liver ROS,   Endo/Other  Morbid obesity  Renal/GU negative Renal ROS     Musculoskeletal negative musculoskeletal ROS (+)   Abdominal   Peds  Hematology   Anesthesia Other Findings   Reproductive/Obstetrics negative OB ROS                            Anesthesia Physical Anesthesia Plan  ASA: II  Anesthesia Plan: MAC   Post-op Pain Management:    Induction:   Airway Management Planned: Natural Airway and Mask  Additional Equipment:   Intra-op Plan:   Post-operative Plan:   Informed Consent: I have reviewed the patients History and Physical, chart, labs and discussed the procedure including the risks, benefits and alternatives for the proposed anesthesia with the patient or authorized representative who has indicated his/her understanding and acceptance.   Dental advisory given  Plan Discussed with: CRNA, Anesthesiologist and Surgeon  Anesthesia Plan Comments:         Anesthesia Quick Evaluation

## 2015-09-11 NOTE — CV Procedure (Signed)
    CARDIOVERSION NOTE  Procedure: Electrical Cardioversion Indications:  Atrial Flutter  Procedure Details:  Consent: Risks of procedure as well as the alternatives and risks of each were explained to the (patient/caregiver).  Consent for procedure obtained.  Time Out: Verified patient identification, verified procedure, site/side was marked, verified correct patient position, special equipment/implants available, medications/allergies/relevent history reviewed, required imaging and test results available.  Performed  Patient placed on cardiac monitor, pulse oximetry, supplemental oxygen as necessary.  Sedation given: Propofol per anesthesia Pacer pads placed anterior and posterior chest.  Cardioverted 1 time(s).  Cardioverted at 150J biphasic.  Impression: Findings: Post procedure EKG shows: NSR Complications: None Patient did tolerate procedure well.  Plan: 1. Continue current dose flecainide, metoprolol and Eliquis. 2. Will arrange for outpatient sleep apnea study - last was 10 years ago. 3. Follow-up with me in 4-6 weeks.  Time Spent Directly with the Patient:  45 minutes   Pixie Casino, MD, [facility] Attending Cardiologist [place] 09/11/2015, 10:43 AM

## 2015-09-12 ENCOUNTER — Encounter (HOSPITAL_COMMUNITY): Payer: Self-pay | Admitting: Internal Medicine

## 2015-10-13 ENCOUNTER — Ambulatory Visit: Payer: PPO | Admitting: Internal Medicine

## 2015-10-16 ENCOUNTER — Ambulatory Visit (INDEPENDENT_AMBULATORY_CARE_PROVIDER_SITE_OTHER): Payer: PPO | Admitting: Internal Medicine

## 2015-10-16 ENCOUNTER — Encounter: Payer: Self-pay | Admitting: Internal Medicine

## 2015-10-16 VITALS — BP 142/96 | HR 102 | Ht 68.0 in | Wt 204.2 lb

## 2015-10-16 DIAGNOSIS — I481 Persistent atrial fibrillation: Secondary | ICD-10-CM | POA: Diagnosis not present

## 2015-10-16 DIAGNOSIS — I10 Essential (primary) hypertension: Secondary | ICD-10-CM

## 2015-10-16 DIAGNOSIS — I4892 Unspecified atrial flutter: Secondary | ICD-10-CM | POA: Diagnosis not present

## 2015-10-16 DIAGNOSIS — R5383 Other fatigue: Secondary | ICD-10-CM

## 2015-10-16 DIAGNOSIS — I4819 Other persistent atrial fibrillation: Secondary | ICD-10-CM

## 2015-10-16 MED ORDER — METOPROLOL TARTRATE 25 MG PO TABS: 37.5000 mg | ORAL_TABLET | Freq: Two times a day (BID) | ORAL | Status: DC

## 2015-10-16 NOTE — Patient Instructions (Addendum)
Your physician has recommended you make the following change in your medication: -- STOP flecainide -- INCREASE metoprolol tartrate to 37.5mg  twice daily  Your physician recommends that you schedule a follow-up appointment in [place] at [place]

## 2015-10-16 NOTE — Progress Notes (Signed)
OFFICE NOTE  Chief Complaint:  Follow-up cardioversion  Primary Care Physician: Andria Frames, MD  HPI:  Steve Weaver is a pleasant 65 year old male who has been experiencing some increasing fatigue with exertion and decreased exercise tolerance over the past several years. 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 He has since struggled, working a number of jobs in order to make ends meet.  He used to be very physically active however recently has stopped exercising and has had about 20 pounds of weight gain. He found that he's been more fatigued during certain activities for example planting some bushes in the yard caused him more shortness of breath and fatigue that he was typically use to. He was recently seen for a well check by his primary care provider, including followup on his hypertension and dyslipidemia. He was noted to have an irregular heartbeat an EKG performed apparently showed premature ventricular contractions. I subsequently received the EKG which shows premature atrial contractions. An EKG performed in our office today also shows PACs, for which she is not completely aware of and it is unclear whether this is new or old.  He denies any specific angina.  Steve Weaver underwent an echocardiogram and nuclear stress test in our office at the end of October and early November. The echocardiogram demonstrated normal systolic function with mild aortic insufficiency and mild mitral insufficiency. There was borderline right atrial enlargement.  He was reportedly very nervous about a stress test and at the onset was noted to be in a rapid heart rate of 150 which was regular and there were clear flutter waves. This terminated spontaneously. Perfusion imaging did not demonstrate any defects.  Steve Weaver stopped his lisinopril due to what he felt was low blood pressures. He brought in a list of his blood pressures  today and 8 do seem to be pretty well controlled at home. He reports some of his symptoms have improved. He denies any chest pain or significant recurrent palpitations.  I saw Steve Weaver back today in the office. He reports no complaints. He denies any chest pain, shortness of breath or worsening fatigue. Blood pressure was mildly elevated today 130/92. EKG shows atrial flutter with variable ventricular response at 119. He is completely unaware of this. He does have a history of paroxysmal atrial flutter in the past but it seems to be remotely. He is now 66 inches CHADSVASC score is 2. He is currently on aspirin only.  Steve Weaver returns today for follow-up of atrial fibrillation. Unfortunately his cardioversion was not successful. We did have him undergo nuclear stress testing which showed no evidence of ischemia although the study was gated while he was in A. fib and his EF was reduced. I suspect this is a gating abnormality. We confirmed normal LV function by echo, however did show mild AI and MR. These findings are considered insignificant when contemplating using a 1C antiarrhythmic. We discussed options and I feel that our best chance of success in achieving sinus rhythm would be with antiarrhythmic therapy. I would recommend he start on flecainide 50 mg twice a day. He should continue Eliquis. He will need an EKG in 1 week after starting flecainide.  10/16/2015  Steve Weaver returns today for follow-up. He underwent cardioversion for atrial fibrillation. He says that afterwards he felt significantly better and continues to feel more energized. However today I told him that he is back in  atrial flutter. This tells me that he may not be truly symptomatic with A. Fib/flutter. Obviously the flecainide was not sufficient enough to hold him and rhythm. We discussed options today including continuing to pursue rhythm control or perhaps rate control if he is truly asymptomatic. We could also consider referral for  ablation, although he since he's had A. fib and atrial flutter, while he may be able to be treated with regards to flutter, he may continue to have atrial fibrillation.  PMHx:  Past Medical History  Diagnosis Date  . Hyperlipidemia   . Hypertension   . PAF (paroxysmal atrial fibrillation) [facility])     Past Surgical History  Procedure Laterality Date  . Broke leg  11/1998  . Nm myocar perf wall motion  05/04/2013    bruce myoview - normal stress nuclear study; a-flutter & a-fib with RVR, EF 59%  . Cardioversion N/A 06/08/2015    Procedure: CARDIOVERSION;  Surgeon: Pixie Casino, MD;  Location[facility] ENDOSCOPY;  Service: Cardiovascular;  Laterality: N/A;  . Cardioversion N/A 09/11/2015    Procedure: CARDIOVERSION;  Surgeon: Pixie Casino, MD;  Location: [facility] ENDOSCOPY;  Service: Cardiovascular;  Laterality: N/A;    FAMHx:  Family History  Problem Relation Age of Onset  . Anemia Neg Hx   . Arrhythmia Neg Hx   . Asthma Neg Hx   . Clotting disorder Neg Hx   . Fainting Neg Hx   . Heart attack Neg Hx   . Heart disease Neg Hx   . Heart failure Neg Hx   . Hyperlipidemia Mother   . Hypertension Mother   . Emphysema Mother   . Diabetes Father   . Dementia Father     SOCHx:   reports that he has quit smoking. His smoking use included Cigars. He has never used smokeless tobacco. He reports that he drinks about 3.6 oz of alcohol per week. He reports that he does not use illicit drugs.  ALLERGIES:  Allergies  Allergen Reactions  . Other Rash    Muscle Relaxer     ROS: A comprehensive review of systems was negative.  HOME MEDS: Current Outpatient Prescriptions  Medication Sig Dispense Refill  . apixaban (ELIQUIS) 5 MG TABS tablet Take 1 tablet (5 mg total) by mouth 2 (two) times daily. 60 tablet 6  . Coenzyme Q10 (CO Q 10 PO) Take 1 capsule by mouth daily.    [name] diltiazem (DILACOR XR) 120 MG 24 hr capsule Take 1 capsule (120 mg total) by mouth daily. 30 capsule 5  . lovastatin  (MEVACOR) 40 MG tablet TAKE 1 TABLET (40 MG TOTAL) BY MOUTH DAILY. 90 tablet 3  . metoprolol tartrate (LOPRESSOR) 25 MG tablet Take 1.5 tablets (37.5 mg total) by mouth 2 (two) times daily. 270 tablet 1  . Multiple Vitamin (MULTIVITAMIN) capsule Take 1 capsule by mouth daily.    . Omega-3 Fatty Acids (FISH OIL) 1200 MG CAPS Take 2,400 mg by mouth daily.     No current facility-administered medications for this visit.    LABS/IMAGING: No results found for this or any previous visit (from the past 48 hour(s)). No results found.  VITALS: BP 142/96 mmHg  Pulse 102  Ht 5\' 8"  (1.727 m)  Wt 204 lb 3.2 oz (92.625 kg)  BMI 31.06 kg/m2  EXAM: Deferred  EKG: Atrial flutter with variable AV block at 102  ASSESSMENT: 1. Paroxysmal atrial fibrillation / flutter, with rapid ventricular response - CHADS2VASC score of 2 2. Hypertension 3.  Dyslipidemia  PLAN: 1.   Steve Weaver has had both atrial fibrillation and atrial flutter and failed multiple cardioversions as well as antiarrhythmic therapy on flecainide. At this point it is not clear to me that he is symptomatic. He does not feel palpitations and only has had fatigue, but today he said that he felt much better since his cardioversion although he is in atrial flutter again. Based on this, I'm not very enthusiastic about continuing to pursue sinus rhythm. It may be better to consider rate control and anticoagulation. I provided both options to him today and he is in agreement. He would like to see if he develops any symptoms related to the flutter going forward. As his rate is elevated today, I did recommend increasing metoprolol to 37.5 mg twice daily. We'll also discontinue his flecainide therapy. Follow-up with me in a few months. He should continue Eliquis for CHADSVASC score 2.  Pixie Casino, MD, [facility] Attending Cardiologist [place] Alea Ryer 10/16/2015, 10:27 AM

## 2015-10-26 ENCOUNTER — Other Ambulatory Visit: Payer: Self-pay | Admitting: Internal Medicine

## 2015-10-26 NOTE — Telephone Encounter (Signed)
Rx request sent to pharmacy.  

## 2015-11-02 ENCOUNTER — Ambulatory Visit: Payer: PPO | Admitting: Cardiology

## 2015-11-07 ENCOUNTER — Ambulatory Visit (HOSPITAL_BASED_OUTPATIENT_CLINIC_OR_DEPARTMENT_OTHER): Payer: PPO | Attending: Internal Medicine | Admitting: Cardiovascular Disease

## 2015-11-07 VITALS — Ht 69.0 in | Wt 198.0 lb

## 2015-11-07 DIAGNOSIS — I4891 Unspecified atrial fibrillation: Secondary | ICD-10-CM

## 2015-11-07 DIAGNOSIS — Z6829 Body mass index (BMI) 29.0-29.9, adult: Secondary | ICD-10-CM | POA: Diagnosis not present

## 2015-11-07 DIAGNOSIS — I10 Essential (primary) hypertension: Secondary | ICD-10-CM | POA: Insufficient documentation

## 2015-11-07 DIAGNOSIS — G4730 Sleep apnea, unspecified: Secondary | ICD-10-CM

## 2015-11-07 DIAGNOSIS — Z79899 Other long term (current) drug therapy: Secondary | ICD-10-CM | POA: Insufficient documentation

## 2015-11-07 DIAGNOSIS — G4737 Central sleep apnea in conditions classified elsewhere: Secondary | ICD-10-CM | POA: Diagnosis not present

## 2015-11-07 DIAGNOSIS — Z7901 Long term (current) use of anticoagulants: Secondary | ICD-10-CM | POA: Diagnosis not present

## 2015-11-07 DIAGNOSIS — R0683 Snoring: Secondary | ICD-10-CM | POA: Insufficient documentation

## 2015-11-07 DIAGNOSIS — G4733 Obstructive sleep apnea (adult) (pediatric): Secondary | ICD-10-CM | POA: Diagnosis not present

## 2015-11-07 DIAGNOSIS — R5383 Other fatigue: Secondary | ICD-10-CM | POA: Insufficient documentation

## 2015-11-07 DIAGNOSIS — E669 Obesity, unspecified: Secondary | ICD-10-CM

## 2015-11-12 ENCOUNTER — Encounter (HOSPITAL_BASED_OUTPATIENT_CLINIC_OR_DEPARTMENT_OTHER): Payer: Self-pay | Admitting: Cardiovascular Disease

## 2015-11-12 NOTE — Procedures (Signed)
Patient Name: Steve Weaver, Steve Weaver Date: 11/07/2015 Gender: Male D.O.B: Apr 13, 1951 Age (years): 64 Referring Provider: Nadean Corwin Hilty Height (inches): 69 Interpreting Physician: Shelva Majestic MD, ABSM Weight (lbs): 198 RPSGT: Laren Everts BMI: 29 MRN: [id number] Neck Size: 16.50  CLINICAL INFORMATION Sleep Study Type: NPSG Indication for sleep study: Fatigue, Hypertension, Re-Evaluation, Snoring, Witnessed Apneas Epworth Sleepiness Score: 5  SLEEP STUDY TECHNIQUE As per the AASM Manual for the Scoring of Sleep and Associated Events v2.3 (April 2016) with a hypopnea requiring 4% desaturations. The channels recorded and monitored were frontal, central and occipital EEG, electrooculogram (EOG), submentalis EMG (chin), nasal and oral airflow, thoracic and abdominal wall motion, anterior tibialis EMG, snore microphone, electrocardiogram, and pulse oximetry.  MEDICATIONS  apixaban (ELIQUIS) 5 MG TABS tablet 5 mg, 2 times daily     Coenzyme Q10 (CO Q 10 PO) 1 capsule, Daily     diltiazem (CARDIZEM CD) 120 MG 24 hr capsule      lovastatin (MEVACOR) 40 MG tablet      metoprolol tartrate (LOPRESSOR) 25 MG tablet 37.5 mg, 2 times daily     Multiple Vitamin (MULTIVITAMIN) capsule 1 capsule, Daily     Omega-3 Fatty Acids (FISH OIL) 1200     Medications self-administered by patient during sleep study : No sleep medicine administered.  SLEEP ARCHITECTURE The study was initiated at 10:50:14 PM and ended at 4:55:52 AM. Sleep onset time was 21.9 minutes and the sleep efficiency was 47.1%. The total sleep time was 172.3 minutes. Wake after sleep onset (WASO) was 171.5 minutes Stage REM latency was 201.0 minutes. The patient spent 50.66% of the night in stage N1 sleep, 30.18% in stage N2 sleep, 0.00% in stage N3 and 19.16% in REM. Alpha intrusion was absent. Supine sleep was 41.08%.  RESPIRATORY PARAMETERS The overall apnea/hypopnea index (AHI) was 48.1 per hour. There were 88  total apneas, including 21 obstructive, 30 central and 37 mixed apneas. There were 50 hypopneas and 15 RERAs. The AHI during Stage REM sleep was 49.1 per hour. AHI while supine was 69.5 per hour. The mean oxygen saturation was 94.22%. The minimum SpO2 during sleep was 87.00%. Moderate snoring was noted during this study.  CARDIAC DATA The 2 lead EKG demonstrated sinus rhythm. The mean heart rate was 77.35 beats per minute. Other EKG findings include: None.  LEG MOVEMENT DATA The total PLMS were 0 with a resulting PLMS index of 0.00. Associated arousal with leg movement index was 0.0 .  IMPRESSIONS - Severe obstructive sleep apnea  (AHI = 48.1/h); events were worse with supine posture (AHI 69.5/h) - Mild central sleep apnea occurred during this study (CAI = 10.4/h). - Mild oxygen desaturation was noted during this study (Min O2 = 87.00%). - Reduced sleep efficiency. - Abnormal sleep architect with absence of slow wave sleep and prolonged latency to REM sleep - The patient snored with Moderate snoring volume. - No cardiac abnormalities were noted during this study. - Clinically significant periodic limb movements did not occur during sleep. No significant associated arousals.  DIAGNOSIS - Obstructive Sleep Apnea (327.23 [G47.33 ICD-10]) - Central Sleep Apnea (327.27 [G47.37 ICD-10])  RECOMMENDATIONS - Recommend CPAP titration to determine optimal pressure required to alleviate sleep disordered breathing. BiPAP or ASV titration may be required to eliminate central sleep apnea. - Efforts should be done to optimize nasal and oral pharyngeal patency. - The patient should be advised to avoid sleeping in the supine position; positional therapy may be beneficial. - Avoid alcohol, sedatives  and other CNS depressants that may worsen sleep apnea and disrupt normal sleep architecture. - Sleep hygiene should be reviewed to assess factors that may improve sleep quality. - Weight management and  regular exercise should be initiated or continued if appropriate.   Troy Sine, MD, [place], American Board of Sleep Medicine  ELECTRONICALLY SIGNED ON:  11/12/2015, 11:21 PM [place] PH: [phone]   FX: [phone] [place]

## 2015-11-15 ENCOUNTER — Telehealth: Payer: Self-pay | Admitting: *Deleted

## 2015-11-15 ENCOUNTER — Other Ambulatory Visit: Payer: Self-pay | Admitting: *Deleted

## 2015-11-15 DIAGNOSIS — G4733 Obstructive sleep apnea (adult) (pediatric): Secondary | ICD-10-CM

## 2015-11-15 NOTE — Progress Notes (Signed)
Patient notified of sleep study results and recommendations. 

## 2015-11-15 NOTE — Telephone Encounter (Signed)
Patient notified of sleep study results and recommendations. CPAP titration ordered.

## 2015-11-17 ENCOUNTER — Other Ambulatory Visit: Payer: Self-pay | Admitting: Internal Medicine

## 2015-11-22 ENCOUNTER — Telehealth: Payer: Self-pay | Admitting: Internal Medicine

## 2015-11-22 NOTE — Telephone Encounter (Signed)
Patient wanted to know if new prescription was sent to pharmacy-  He is about out with current prescription of metoprolol bottle  RN informed patient that anew prescription was e- sent to pharmacy on 10/16/15  #[phone] x 3 refills  contact pharmacy,if any issue may call office back Patient verbalized understanding

## 2015-11-22 NOTE — Telephone Encounter (Signed)
New message      Pt c/o medication issue:  1. Name of Medication: Metoprolol  2. How are you currently taking this medication (dosage and times per day)? 25 mg po twice daily  3. Are you having a reaction (difficulty breathing--STAT)? no  4. What is your medication issue? The Md had advice the pt to increase the medication to three times and the pt wants to make sure this is the right adjustment

## 2015-12-25 ENCOUNTER — Encounter: Payer: Self-pay | Admitting: Internal Medicine

## 2015-12-25 ENCOUNTER — Ambulatory Visit (INDEPENDENT_AMBULATORY_CARE_PROVIDER_SITE_OTHER): Payer: PPO | Admitting: Internal Medicine

## 2015-12-25 VITALS — BP 130/84 | HR 86 | Ht 68.0 in | Wt 202.4 lb

## 2015-12-25 DIAGNOSIS — J3489 Other specified disorders of nose and nasal sinuses: Secondary | ICD-10-CM

## 2015-12-25 DIAGNOSIS — I481 Persistent atrial fibrillation: Secondary | ICD-10-CM

## 2015-12-25 DIAGNOSIS — Z8781 Personal history of (healed) traumatic fracture: Secondary | ICD-10-CM

## 2015-12-25 DIAGNOSIS — I4819 Other persistent atrial fibrillation: Secondary | ICD-10-CM

## 2015-12-25 DIAGNOSIS — H9319 Tinnitus, unspecified ear: Secondary | ICD-10-CM | POA: Insufficient documentation

## 2015-12-25 DIAGNOSIS — G4730 Sleep apnea, unspecified: Secondary | ICD-10-CM | POA: Diagnosis not present

## 2015-12-25 DIAGNOSIS — R5383 Other fatigue: Secondary | ICD-10-CM

## 2015-12-25 NOTE — Progress Notes (Addendum)
OFFICE NOTE  Chief Complaint:  Follow-up med changes, sleep study, nasal septal deviation, tinnitus  Primary Care Physician: Andria Frames, MD  HPI:  Steve Weaver is a pleasant 65 year old male who has been experiencing some increasing fatigue with exertion and decreased exercise tolerance over the past several years. 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 He has since struggled, working a number of jobs in order to make ends meet.  He used to be very physically active however recently has stopped exercising and has had about 20 pounds of weight gain. He found that he's been more fatigued during certain activities for example planting some bushes in the yard caused him more shortness of breath and fatigue that he was typically use to. He was recently seen for a well check by his primary care provider, including followup on his hypertension and dyslipidemia. He was noted to have an irregular heartbeat an EKG performed apparently showed premature ventricular contractions. I subsequently received the EKG which shows premature atrial contractions. An EKG performed in our office today also shows PACs, for which she is not completely aware of and it is unclear whether this is new or old.  He denies any specific angina.  [name] underwent an echocardiogram and nuclear stress test in our office at the end of October and early November. The echocardiogram demonstrated normal systolic function with mild aortic insufficiency and mild mitral insufficiency. There was borderline right atrial enlargement.  He was reportedly very nervous about a stress test and at the onset was noted to be in a rapid heart rate of 150 which was regular and there were clear flutter waves. This terminated spontaneously. Perfusion imaging did not demonstrate any defects.  [name] stopped his lisinopril due to what he felt was low blood pressures.  He brought in a list of his blood pressures today and 8 do seem to be pretty well controlled at home. He reports some of his symptoms have improved. He denies any chest pain or significant recurrent palpitations.  I saw [name] back today in the office. He reports no complaints. He denies any chest pain, shortness of breath or worsening fatigue. Blood pressure was mildly elevated today 130/92. EKG shows atrial flutter with variable ventricular response at 119. He is completely unaware of this. He does have a history of paroxysmal atrial flutter in the past but it seems to be remotely. He is now 66 inches CHADSVASC score is 2. He is currently on aspirin only.  [name] for follow-up of atrial fibrillation. Unfortunately his cardioversion was not successful. We did have him undergo nuclear stress testing which showed no evidence of ischemia although the study was gated while he was in A. fib and his EF was reduced. I suspect this is a gating abnormality. We confirmed normal LV function by echo, however did show mild AI and MR. These findings are considered insignificant when contemplating using a 1C antiarrhythmic. We discussed options and I feel that our best chance of success in achieving sinus rhythm would be with antiarrhythmic therapy. I would recommend he start on flecainide 50 mg twice a day. He should continue Eliquis. He will need an EKG in 1 week after starting flecainide.  10/16/2015  [name] for follow-up. He underwent cardioversion for atrial fibrillation. He says that afterwards he felt significantly better and continues to feel more energized. However today I  told him that he is back in atrial flutter. This tells me that he may not be truly symptomatic with A. Fib/flutter. Obviously the flecainide was not sufficient enough to hold him and rhythm. We discussed options today including continuing to pursue rhythm control or perhaps rate control if he is truly  asymptomatic. We could also consider referral for ablation, although he since he's had A. fib and atrial flutter, while he may be able to be treated with regards to flutter, he may continue to have atrial fibrillation.  12/25/2015  [name] was seen today in follow-up. I maintain him on an increased dose of metoprolol which seems to have controlled his ventricular response. His heart rate is irregular today probably indicating persistent atrial fibrillation. He is on Eliquis without any bleeding problems. He continues to be asymptomatic therefore we will continue a rate control strategy. His recent sleep study came back abnormal suggestive of sleep apnea. He says this is probably related to upper airway problems as he has a history of nasal fracture and college which was significant and he has a deviated nasal septum. This is never been evaluated by an ENT doctor.  PMHx:  Past Medical History  Diagnosis Date  . Hyperlipidemia   . Hypertension   . PAF (paroxysmal atrial fibrillation) [facility])     Past Surgical History  Procedure Laterality Date  . Broke leg  11/1998  . Nm myocar perf wall motion  05/04/2013    bruce myoview - normal stress nuclear study; a-flutter & a-fib with RVR, EF 59%  . Cardioversion N/A 06/08/2015    Procedure: CARDIOVERSION;  Surgeon: Pixie Casino, MD;  Location: [facility] ENDOSCOPY;  Service: Cardiovascular;  Laterality: N/A;  . Cardioversion N/A 09/11/2015    Procedure: CARDIOVERSION;  Surgeon: Pixie Casino, MD;  Location: [facility] ENDOSCOPY;  Service: Cardiovascular;  Laterality: N/A;    FAMHx:  Family History  Problem Relation Age of Onset  . Anemia Neg Hx   . Arrhythmia Neg Hx   . Asthma Neg Hx   . Clotting disorder Neg Hx   . Fainting Neg Hx   . Heart attack Neg Hx   . Heart disease Neg Hx   . Heart failure Neg Hx   . Hyperlipidemia Mother   . Hypertension Mother   . Emphysema Mother   . Diabetes Father   . Dementia Father     SOCHx:   reports that he has  quit smoking. His smoking use included Cigars. He has never used smokeless tobacco. He reports that he drinks about 3.6 oz of alcohol per week. He reports that he does not use illicit drugs.  ALLERGIES:  Allergies  Allergen Reactions  . Other Rash    Muscle Relaxer     ROS: A comprehensive review of systems was negative.  HOME MEDS: Current Outpatient Prescriptions  Medication Sig Dispense Refill  . Coenzyme Q10 (CO Q 10 PO) Take 1 capsule by mouth daily.    [name] diltiazem (CARDIZEM CD) 120 MG 24 hr capsule TAKE ONE CAPSULE BY MOUTH EVERY DAY 30 capsule 2  . ELIQUIS 5 MG TABS tablet TAKE 1 TABLET (5 MG TOTAL) BY MOUTH 2 (TWO) TIMES DAILY. 60 tablet 6  . lovastatin (MEVACOR) 40 MG tablet TAKE 1 TABLET (40 MG TOTAL) BY MOUTH DAILY. 90 tablet 3  . metoprolol tartrate (LOPRESSOR) 25 MG tablet Take 1.5 tablets (37.5 mg total) by mouth 2 (two) times daily. 270 tablet 1  . Multiple Vitamin (MULTIVITAMIN) capsule Take 1  capsule by mouth daily.    . Omega-3 Fatty Acids (FISH OIL) 1200 MG CAPS Take 2,400 mg by mouth daily.     No current facility-administered medications for this visit.    LABS/IMAGING: No results found for this or any previous visit (from the past 48 hour(s)). No results found.  VITALS: BP 130/84 mmHg  Pulse 86  Ht 5\' 8"  (1.727 m)  Wt 202 lb 6.4 oz (91.808 kg)  BMI 30.78 kg/m2  EXAM: Deferred  EKG: Deferred  ASSESSMENT: 1. Paroxysmal atrial fibrillation / flutter, with rapid ventricular response - CHADS2VASC score of 2 2. Hypertension 3. Dyslipidemia 4. OSA-concerned about starting CPAP 5. History of nasal septal deviation  PLAN: 1.   [name] was questionably symptomatic with his AF and we had difficulty maintaining sinus rhythm after cardioversion. Therefore we have elected to do rate control and anticoagulation. I increased his Lopressor to 37.5 mg twice a day at his last office visit and heart rate is now generally better controlled. He did bring a fit  that in and indicated heart rate ranged somewhere between the mid 40s up to the low 90s. He says at times when exercising get his heart rate up to the 150s. Generally this is good control. He was recently diagnosed with OSA but is hesitant to start on CPAP. He would like to further investigate whether his nasal septal deviation secondary to a prior nasal fracture has anything to do with his snoring and difficulty breathing at night. I'll refer him to Dr. Benjamine Mola for evaluation of this and his report of chronic tinnitus. We'll continue his current medicines and plan to see him back in 6 months.  Pixie Casino, MD, [facility] Attending Cardiologist [place] C Ralyn Stlaurent 12/25/2015, 9:20 AM

## 2015-12-25 NOTE — Patient Instructions (Addendum)
You have been referred to Dr. Benjamine Mola (ENT)  Your physician wants you to follow-up in: 6 months with Dr. Debara Pickett. You will receive a reminder letter in the mail two months in advance. If you don't receive a letter, please call our office to schedule the follow-up appointment.

## 2016-01-03 DIAGNOSIS — C44212 Basal cell carcinoma of skin of right ear and external auricular canal: Secondary | ICD-10-CM | POA: Diagnosis not present

## 2016-01-03 DIAGNOSIS — C44519 Basal cell carcinoma of skin of other part of trunk: Secondary | ICD-10-CM | POA: Diagnosis not present

## 2016-01-03 DIAGNOSIS — D225 Melanocytic nevi of trunk: Secondary | ICD-10-CM | POA: Diagnosis not present

## 2016-01-03 DIAGNOSIS — L820 Inflamed seborrheic keratosis: Secondary | ICD-10-CM | POA: Diagnosis not present

## 2016-01-17 ENCOUNTER — Other Ambulatory Visit: Payer: Self-pay

## 2016-01-17 MED ORDER — DILTIAZEM HCL ER COATED BEADS 120 MG PO CP24: 120.0000 mg | ORAL_CAPSULE | Freq: Every day | ORAL | 11 refills | Status: DC

## 2016-01-18 ENCOUNTER — Other Ambulatory Visit: Payer: Self-pay | Admitting: Internal Medicine

## 2016-01-18 NOTE — Telephone Encounter (Signed)
Rx sent to pharmacy   

## 2016-01-22 ENCOUNTER — Other Ambulatory Visit: Payer: Self-pay | Admitting: Internal Medicine

## 2016-01-22 MED ORDER — DILTIAZEM HCL ER COATED BEADS 120 MG PO CP24: 120.0000 mg | ORAL_CAPSULE | Freq: Every day | ORAL | 3 refills | Status: DC

## 2016-01-30 ENCOUNTER — Encounter: Payer: Self-pay | Admitting: Internal Medicine

## 2016-01-30 DIAGNOSIS — J342 Deviated nasal septum: Secondary | ICD-10-CM | POA: Diagnosis not present

## 2016-01-30 DIAGNOSIS — J343 Hypertrophy of nasal turbinates: Secondary | ICD-10-CM | POA: Diagnosis not present

## 2016-01-30 DIAGNOSIS — H9313 Tinnitus, bilateral: Secondary | ICD-10-CM | POA: Diagnosis not present

## 2016-01-30 DIAGNOSIS — H903 Sensorineural hearing loss, bilateral: Secondary | ICD-10-CM | POA: Diagnosis not present

## 2016-01-30 DIAGNOSIS — J310 Chronic rhinitis: Secondary | ICD-10-CM | POA: Diagnosis not present

## 2016-01-31 DIAGNOSIS — X32XXXD Exposure to sunlight, subsequent encounter: Secondary | ICD-10-CM | POA: Diagnosis not present

## 2016-01-31 DIAGNOSIS — L570 Actinic keratosis: Secondary | ICD-10-CM | POA: Diagnosis not present

## 2016-01-31 DIAGNOSIS — C44519 Basal cell carcinoma of skin of other part of trunk: Secondary | ICD-10-CM | POA: Diagnosis not present

## 2016-01-31 DIAGNOSIS — Z85828 Personal history of other malignant neoplasm of skin: Secondary | ICD-10-CM | POA: Diagnosis not present

## 2016-01-31 DIAGNOSIS — Z08 Encounter for follow-up examination after completed treatment for malignant neoplasm: Secondary | ICD-10-CM | POA: Diagnosis not present

## 2016-01-31 DIAGNOSIS — C44311 Basal cell carcinoma of skin of nose: Secondary | ICD-10-CM | POA: Diagnosis not present

## 2016-02-15 ENCOUNTER — Other Ambulatory Visit: Payer: Self-pay | Admitting: Internal Medicine

## 2016-02-15 DIAGNOSIS — I4892 Unspecified atrial flutter: Secondary | ICD-10-CM

## 2016-02-28 DIAGNOSIS — J342 Deviated nasal septum: Secondary | ICD-10-CM | POA: Diagnosis not present

## 2016-02-28 DIAGNOSIS — J310 Chronic rhinitis: Secondary | ICD-10-CM | POA: Diagnosis not present

## 2016-02-28 DIAGNOSIS — J343 Hypertrophy of nasal turbinates: Secondary | ICD-10-CM | POA: Diagnosis not present

## 2016-03-12 ENCOUNTER — Telehealth: Payer: Self-pay | Admitting: *Deleted

## 2016-03-12 NOTE — Telephone Encounter (Signed)
Pt needs clearance for septoplasty and turbinate reduction. will forward for dr hilty's review and advise.

## 2016-03-13 NOTE — Telephone Encounter (Signed)
Acceptable risk for surgery - hold Eliquis 3 days prior to surgery. Restart when feasible after surgery.  Dr. Lemmie Evens

## 2016-03-15 NOTE — Telephone Encounter (Signed)
Will forward this note to the number provided. 

## 2016-04-03 DIAGNOSIS — Z85828 Personal history of other malignant neoplasm of skin: Secondary | ICD-10-CM | POA: Diagnosis not present

## 2016-04-03 DIAGNOSIS — C44612 Basal cell carcinoma of skin of right upper limb, including shoulder: Secondary | ICD-10-CM | POA: Diagnosis not present

## 2016-04-03 DIAGNOSIS — X32XXXD Exposure to sunlight, subsequent encounter: Secondary | ICD-10-CM | POA: Diagnosis not present

## 2016-04-03 DIAGNOSIS — C44619 Basal cell carcinoma of skin of left upper limb, including shoulder: Secondary | ICD-10-CM | POA: Diagnosis not present

## 2016-04-03 DIAGNOSIS — Z08 Encounter for follow-up examination after completed treatment for malignant neoplasm: Secondary | ICD-10-CM | POA: Diagnosis not present

## 2016-04-03 DIAGNOSIS — L570 Actinic keratosis: Secondary | ICD-10-CM | POA: Diagnosis not present

## 2016-04-17 ENCOUNTER — Other Ambulatory Visit: Payer: Self-pay | Admitting: Internal Medicine

## 2016-04-17 NOTE — Telephone Encounter (Signed)
Rx has been sent to the pharmacy electronically. ° °

## 2016-05-27 ENCOUNTER — Other Ambulatory Visit: Payer: Self-pay | Admitting: Internal Medicine

## 2016-05-27 DIAGNOSIS — I4892 Unspecified atrial flutter: Secondary | ICD-10-CM

## 2016-07-01 ENCOUNTER — Encounter: Payer: Self-pay | Admitting: Internal Medicine

## 2016-07-01 ENCOUNTER — Ambulatory Visit (INDEPENDENT_AMBULATORY_CARE_PROVIDER_SITE_OTHER): Payer: PPO | Admitting: Internal Medicine

## 2016-07-01 VITALS — BP 142/96 | HR 87 | Ht 68.0 in | Wt 206.0 lb

## 2016-07-01 DIAGNOSIS — I481 Persistent atrial fibrillation: Secondary | ICD-10-CM | POA: Diagnosis not present

## 2016-07-01 DIAGNOSIS — I4892 Unspecified atrial flutter: Secondary | ICD-10-CM | POA: Diagnosis not present

## 2016-07-01 DIAGNOSIS — I10 Essential (primary) hypertension: Secondary | ICD-10-CM

## 2016-07-01 DIAGNOSIS — E785 Hyperlipidemia, unspecified: Secondary | ICD-10-CM

## 2016-07-01 DIAGNOSIS — I4819 Other persistent atrial fibrillation: Secondary | ICD-10-CM

## 2016-07-01 DIAGNOSIS — Z79899 Other long term (current) drug therapy: Secondary | ICD-10-CM

## 2016-07-01 MED ORDER — APIXABAN 5 MG PO TABS: ORAL_TABLET | ORAL | 3 refills | Status: DC

## 2016-07-01 NOTE — Progress Notes (Signed)
OFFICE NOTE  Chief Complaint:  Follow-up med changes, sleep study, nasal septal deviation, tinnitus  Primary Care Physician: Andria Frames, MD  HPI:  Steve Weaver is a pleasant 66 year old male who has been experiencing some increasing fatigue with exertion and decreased exercise tolerance over the past several years. 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 He has since struggled, working a number of jobs in order to make ends meet.  He used to be very physically active however recently has stopped exercising and has had about 20 pounds of weight gain. He found that he's been more fatigued during certain activities for example planting some bushes in the yard caused him more shortness of breath and fatigue that he was typically use to. He was recently seen for a well check by his primary care provider, including followup on his hypertension and dyslipidemia. He was noted to have an irregular heartbeat an EKG performed apparently showed premature ventricular contractions. I subsequently received the EKG which shows premature atrial contractions. An EKG performed in our office today also shows PACs, for which she is not completely aware of and it is unclear whether this is new or old.  He denies any specific angina.  [name] underwent an echocardiogram and nuclear stress test in our office at the end of October and early November. The echocardiogram demonstrated normal systolic function with mild aortic insufficiency and mild mitral insufficiency. There was borderline right atrial enlargement.  He was reportedly very nervous about a stress test and at the onset was noted to be in a rapid heart rate of 150 which was regular and there were clear flutter waves. This terminated spontaneously. Perfusion imaging did not demonstrate any defects.  [name] stopped his lisinopril due to what he felt was low blood pressures.  He brought in a list of his blood pressures today and 8 do seem to be pretty well controlled at home. He reports some of his symptoms have improved. He denies any chest pain or significant recurrent palpitations.  I saw [name] back today in the office. He reports no complaints. He denies any chest pain, shortness of breath or worsening fatigue. Blood pressure was mildly elevated today 130/92. EKG shows atrial flutter with variable ventricular response at 119. He is completely unaware of this. He does have a history of paroxysmal atrial flutter in the past but it seems to be remotely. He is now 66 inches CHADSVASC score is 2. He is currently on aspirin only.  [name] for follow-up of atrial fibrillation. Unfortunately his cardioversion was not successful. We did have him undergo nuclear stress testing which showed no evidence of ischemia although the study was gated while he was in A. fib and his EF was reduced. I suspect this is a gating abnormality. We confirmed normal LV function by echo, however did show mild AI and MR. These findings are considered insignificant when contemplating using a 1C antiarrhythmic. We discussed options and I feel that our best chance of success in achieving sinus rhythm would be with antiarrhythmic therapy. I would recommend he start on flecainide 50 mg twice a day. He should continue Eliquis. He will need an EKG in 1 week after starting flecainide.  10/16/2015  [name] for follow-up. He underwent cardioversion for atrial fibrillation. He says that afterwards he felt significantly better and continues to feel more energized. However today I  told him that he is back in atrial flutter. This tells me that he may not be truly symptomatic with A. Fib/flutter. Obviously the flecainide was not sufficient enough to hold him and rhythm. We discussed options today including continuing to pursue rhythm control or perhaps rate control if he is truly  asymptomatic. We could also consider referral for ablation, although he since he's had A. fib and atrial flutter, while he may be able to be treated with regards to flutter, he may continue to have atrial fibrillation.  12/25/2015  [name] was seen today in follow-up. I maintain him on an increased dose of metoprolol which seems to have controlled his ventricular response. His heart rate is irregular today probably indicating persistent atrial fibrillation. He is on Eliquis without any bleeding problems. He continues to be asymptomatic therefore we will continue a rate control strategy. His recent sleep study came back abnormal suggestive of sleep apnea. He says this is probably related to upper airway problems as he has a history of nasal fracture and college which was significant and he has a deviated nasal septum. This is never been evaluated by an ENT doctor.  07/01/2016  [name] was seen today in follow-up. Recently I cleared him for ENT surgery however he is not yet undergone that. Blood pressure was elevated today 142/96 have her recheck was 138/80. He is overdue for a lipid profile and metabolic profile. He is in atrial fibrillation today this may be persistent and we have not pursued any further attempts for achieving sinus rhythm since he is completely asymptomatic. Heart rate is well-controlled. He is tolerating Eliquis without any bleeding problems.  PMHx:  Past Medical History:  Diagnosis Date  . Hyperlipidemia   . Hypertension   . PAF (paroxysmal atrial fibrillation) ([place])     Past Surgical History:  Procedure Laterality Date  . BROKE LEG  11/1998  . CARDIOVERSION N/A 06/08/2015   Procedure: CARDIOVERSION;  Surgeon: Pixie Casino, MD;  Location: [place];  Service: Cardiovascular;  Laterality: N/A;  . CARDIOVERSION N/A 09/11/2015   Procedure: CARDIOVERSION;  Surgeon: Pixie Casino, MD;  Location: [facility] ENDOSCOPY;  Service: Cardiovascular;  Laterality: N/A;  . NM MYOCAR PERF  WALL MOTION  05/04/2013   bruce myoview - normal stress nuclear study; a-flutter & a-fib with RVR, EF 59%    FAMHx:  Family History  Problem Relation Age of Onset  . Hyperlipidemia Mother   . Hypertension Mother   . Emphysema Mother   . Diabetes Father   . Dementia Father   . Anemia Neg Hx   . Arrhythmia Neg Hx   . Asthma Neg Hx   . Clotting disorder Neg Hx   . Fainting Neg Hx   . Heart attack Neg Hx   . Heart disease Neg Hx   . Heart failure Neg Hx     SOCHx:   reports that he has quit smoking. His smoking use included Cigars. He has never used smokeless tobacco. He reports that he drinks about 3.6 oz of alcohol per week . He reports that he does not use drugs.  ALLERGIES:  Allergies  Allergen Reactions  . Other Rash    Muscle Relaxer     ROS: A comprehensive review of systems was negative.  HOME MEDS: Current Outpatient Prescriptions  Medication Sig Dispense Refill  . apixaban (ELIQUIS) 5 MG TABS tablet TAKE 1 TABLET (5 MG TOTAL) BY MOUTH TWO TIMES DAILY. 180 tablet 3  . Coenzyme Q10 (CO  Q 10 PO) Take 1 capsule by mouth daily.    [name] diltiazem (CARDIZEM CD) 120 MG 24 hr capsule Take 1 capsule (120 mg total) by mouth daily. 90 capsule 3  . lovastatin (MEVACOR) 40 MG tablet TAKE 1 TABLET BY MOUTH EVERY DAY 90 tablet 2  . metoprolol tartrate (LOPRESSOR) 25 MG tablet TAKE 1 AND 1/2 TABLET BY MOUTH TWICE A DAY 270 tablet 1  . Multiple Vitamin (MULTIVITAMIN) capsule Take 1 capsule by mouth daily.    . Omega-3 Fatty Acids (FISH OIL) 1200 MG CAPS Take 2,400 mg by mouth daily.     No current facility-administered medications for this visit.     LABS/IMAGING: No results found for this or any previous visit (from the past 48 hour(s)). No results found.  VITALS: BP (!) 142/96 (BP Location: Right Arm, Patient Position: Sitting, Cuff Size: Normal)   Pulse 87   Ht 5\' 8"  (1.727 m)   Wt 206 lb (93.4 kg)   BMI 31.32 kg/m   EXAM: General appearance: alert and no  distress Neck: no carotid bruit and no JVD Lungs: clear to auscultation bilaterally Heart: irregularly irregular rhythm Abdomen: soft, non-tender; bowel sounds normal; no masses,  no organomegaly Extremities: extremities normal, atraumatic, no cyanosis or edema Pulses: 2+ and symmetric Skin: Skin color, texture, turgor normal. No rashes or lesions Neurologic: Grossly normal Psych: Pleasant  EKG: Atrial fibrillation with PVCs at 87  ASSESSMENT: 1. Paroxysmal atrial fibrillation / flutter, with rapid ventricular response - CHADS2VASC score of 2 2. Hypertension 3. Dyslipidemia 4. OSA-concerned about starting CPAP 5. History of nasal septal deviation - contemplating septoplasty  PLAN: 1.   [name] really seems to be asymptomatic with his atrial fibrillation. He is persistently in that now. He is on Eliquis which he is tolerating. Hypertension is fairly well-controlled. He is due for repeat lipid profile. He is concerned about going on CPAP therapy although was diagnosed with sleep apnea. He is going to try to pursue septoplasty hopefully within the next month.   Follow-up 6 months.   Pixie Casino, MD, [facility] Attending Cardiologist [place] C Donise Woodle 07/01/2016, 10:38 AM

## 2016-07-01 NOTE — Patient Instructions (Signed)
Your physician recommends that you return for lab work FASTING (lipid, BMET)  Your physician wants you to follow-up in: [place] with Dr. Debara Pickett. You will receive a reminder letter in the mail two months in advance. If you don't receive a letter, please call our office to schedule the follow-up appointment.

## 2016-07-05 DIAGNOSIS — Z79899 Other long term (current) drug therapy: Secondary | ICD-10-CM | POA: Diagnosis not present

## 2016-07-05 DIAGNOSIS — E785 Hyperlipidemia, unspecified: Secondary | ICD-10-CM | POA: Diagnosis not present

## 2016-07-06 LAB — LIPID PANEL
Cholesterol: 161 mg/dL (ref <200)
HDL: 44 mg/dL (ref >40)
LDL Cholesterol: 84 mg/dL (ref <100)
Total CHOL/HDL Ratio: 3.7 Ratio (ref <5.0)
Triglycerides: 164 mg/dL — ABNORMAL HIGH (ref <150)
VLDL: 33 mg/dL — ABNORMAL HIGH (ref <30)

## 2016-07-06 LAB — BASIC METABOLIC PANEL
BUN: 18 mg/dL (ref 7–25)
CO2: 28 mmol/L (ref 20–31)
Calcium: 9.3 mg/dL (ref 8.6–10.3)
Chloride: 108 mmol/L (ref 98–110)
Creat: 1.51 mg/dL — ABNORMAL HIGH (ref 0.70–1.25)
Glucose, Bld: 96 mg/dL (ref 65–99)
Potassium: 4.8 mmol/L (ref 3.5–5.3)
Sodium: 143 mmol/L (ref 135–146)

## 2016-07-08 ENCOUNTER — Other Ambulatory Visit: Payer: Self-pay | Admitting: Internal Medicine

## 2016-10-15 DIAGNOSIS — J342 Deviated nasal septum: Secondary | ICD-10-CM | POA: Diagnosis not present

## 2016-10-15 DIAGNOSIS — J343 Hypertrophy of nasal turbinates: Secondary | ICD-10-CM | POA: Diagnosis not present

## 2016-10-15 DIAGNOSIS — J310 Chronic rhinitis: Secondary | ICD-10-CM | POA: Diagnosis not present

## 2016-10-25 ENCOUNTER — Other Ambulatory Visit: Payer: Self-pay | Admitting: Otolaryngology

## 2016-11-17 ENCOUNTER — Other Ambulatory Visit: Payer: Self-pay | Admitting: Internal Medicine

## 2016-11-17 DIAGNOSIS — I4892 Unspecified atrial flutter: Secondary | ICD-10-CM

## 2016-11-19 ENCOUNTER — Telehealth: Payer: Self-pay | Admitting: Internal Medicine

## 2016-11-19 DIAGNOSIS — I4892 Unspecified atrial flutter: Secondary | ICD-10-CM

## 2016-11-19 MED ORDER — LOVASTATIN 40 MG PO TABS: 40.0000 mg | ORAL_TABLET | Freq: Every day | ORAL | 0 refills | Status: DC

## 2016-11-19 MED ORDER — METOPROLOL TARTRATE 25 MG PO TABS: 37.5000 mg | ORAL_TABLET | Freq: Two times a day (BID) | ORAL | 0 refills | Status: DC

## 2016-11-19 NOTE — Telephone Encounter (Signed)
Request for surgical clearance:  1. What type of surgery is being performed? ENT    2. When is this surgery scheduled? July 9th, 2018  3. Are there any medications that need to be held prior to surgery and how long? Hold eliquis 48 hours to surgery  4. Name of physician performing surgery? Dr.Su Jonathon Bellows Teoh   5. What is your office phone and fax number? Office # [phone]  Please follow up with patient.

## 2016-11-19 NOTE — Telephone Encounter (Signed)
Patient notified he is due for 6 month appt with Dr. Debara Pickett - scheduled Refills sent to pharmacy

## 2016-11-19 NOTE — Telephone Encounter (Signed)
Message routed to MD/pharmacy staff

## 2016-11-19 NOTE — Telephone Encounter (Signed)
Patient calling, states that his lovastatin and metoprolol medications will need an authorization next time he gets them refilled. Thanks.

## 2016-11-20 NOTE — Telephone Encounter (Signed)
Ok to hold Eliquis 3 days (72 hours) prior to surgery. Acceptable risk for surgery.  Dr. Lemmie Evens

## 2016-11-20 NOTE — Telephone Encounter (Signed)
Patient called w/clearance advice. Voiced understanding. Routed to Dr. Benjamine Mola via [facility]

## 2016-12-05 ENCOUNTER — Other Ambulatory Visit: Payer: Self-pay | Admitting: Internal Medicine

## 2016-12-06 ENCOUNTER — Encounter (HOSPITAL_BASED_OUTPATIENT_CLINIC_OR_DEPARTMENT_OTHER): Payer: Self-pay | Admitting: *Deleted

## 2016-12-16 ENCOUNTER — Ambulatory Visit (HOSPITAL_BASED_OUTPATIENT_CLINIC_OR_DEPARTMENT_OTHER)
Admission: RE | Admit: 2016-12-16 | Discharge: 2016-12-16 | Disposition: A | Discharge status: Home / Self Care | Payer: PPO | Source: Ambulatory Visit | Attending: Otolaryngology | Admitting: Otolaryngology

## 2016-12-16 ENCOUNTER — Ambulatory Visit (HOSPITAL_BASED_OUTPATIENT_CLINIC_OR_DEPARTMENT_OTHER): Payer: PPO | Admitting: Anesthesiology

## 2016-12-16 ENCOUNTER — Encounter (HOSPITAL_BASED_OUTPATIENT_CLINIC_OR_DEPARTMENT_OTHER): Payer: Self-pay | Admitting: Anesthesiology

## 2016-12-16 ENCOUNTER — Encounter (HOSPITAL_BASED_OUTPATIENT_CLINIC_OR_DEPARTMENT_OTHER)
Admission: RE | Disposition: A | Discharge status: Home / Self Care | Payer: Self-pay | Source: Ambulatory Visit | Attending: Otolaryngology

## 2016-12-16 DIAGNOSIS — G4730 Sleep apnea, unspecified: Secondary | ICD-10-CM | POA: Diagnosis not present

## 2016-12-16 DIAGNOSIS — Z79899 Other long term (current) drug therapy: Secondary | ICD-10-CM | POA: Diagnosis not present

## 2016-12-16 DIAGNOSIS — Z7901 Long term (current) use of anticoagulants: Secondary | ICD-10-CM | POA: Diagnosis not present

## 2016-12-16 DIAGNOSIS — I10 Essential (primary) hypertension: Secondary | ICD-10-CM | POA: Insufficient documentation

## 2016-12-16 DIAGNOSIS — I4891 Unspecified atrial fibrillation: Secondary | ICD-10-CM | POA: Insufficient documentation

## 2016-12-16 DIAGNOSIS — J343 Hypertrophy of nasal turbinates: Secondary | ICD-10-CM | POA: Insufficient documentation

## 2016-12-16 DIAGNOSIS — J3489 Other specified disorders of nose and nasal sinuses: Secondary | ICD-10-CM | POA: Insufficient documentation

## 2016-12-16 DIAGNOSIS — Z87891 Personal history of nicotine dependence: Secondary | ICD-10-CM | POA: Diagnosis not present

## 2016-12-16 DIAGNOSIS — J342 Deviated nasal septum: Secondary | ICD-10-CM | POA: Diagnosis not present

## 2016-12-16 HISTORY — PX: NASAL SEPTOPLASTY W/ TURBINOPLASTY: SHX2070

## 2016-12-16 SURGERY — SEPTOPLASTY, NOSE, WITH NASAL TURBINATE REDUCTION
Anesthesia: General | Site: Nose | Laterality: Bilateral

## 2016-12-16 MED ORDER — ONDANSETRON HCL 4 MG/2ML IJ SOLN
INTRAMUSCULAR | Status: AC
  Filled 2016-12-16: qty 2

## 2016-12-16 MED ORDER — MUPIROCIN 2 % EX OINT
TOPICAL_OINTMENT | CUTANEOUS | Status: AC
  Filled 2016-12-16: qty 22

## 2016-12-16 MED ORDER — FENTANYL CITRATE (PF) 100 MCG/2ML IJ SOLN
50.0000 ug | INTRAMUSCULAR | Status: DC | PRN
  Administered 2016-12-16: 100 ug via INTRAVENOUS

## 2016-12-16 MED ORDER — SCOPOLAMINE 1 MG/3DAYS TD PT72: 1.0000 | MEDICATED_PATCH | Freq: Once | TRANSDERMAL | Status: DC | PRN

## 2016-12-16 MED ORDER — LIDOCAINE HCL (CARDIAC) 20 MG/ML IV SOLN
INTRAVENOUS | Status: AC
  Filled 2016-12-16: qty 5

## 2016-12-16 MED ORDER — LIDOCAINE 2% (20 MG/ML) 5 ML SYRINGE
INTRAMUSCULAR | Status: DC | PRN
  Administered 2016-12-16: 60 mg via INTRAVENOUS

## 2016-12-16 MED ORDER — CEFAZOLIN SODIUM-DEXTROSE 2-3 GM-% IV SOLR
INTRAVENOUS | Status: DC | PRN
  Administered 2016-12-16: 2 g via INTRAVENOUS

## 2016-12-16 MED ORDER — LIDOCAINE-EPINEPHRINE 1 %-1:100000 IJ SOLN
INTRAMUSCULAR | Status: DC | PRN
  Administered 2016-12-16: 5.5 mL

## 2016-12-16 MED ORDER — MUPIROCIN 2 % EX OINT
TOPICAL_OINTMENT | CUTANEOUS | Status: DC | PRN
  Administered 2016-12-16: 1 via NASAL

## 2016-12-16 MED ORDER — OXYMETAZOLINE HCL 0.05 % NA SOLN
NASAL | Status: AC
  Filled 2016-12-16: qty 45

## 2016-12-16 MED ORDER — SUCCINYLCHOLINE CHLORIDE 200 MG/10ML IV SOSY
PREFILLED_SYRINGE | INTRAVENOUS | Status: AC
  Filled 2016-12-16: qty 10

## 2016-12-16 MED ORDER — DEXAMETHASONE SODIUM PHOSPHATE 4 MG/ML IJ SOLN
INTRAMUSCULAR | Status: DC | PRN
  Administered 2016-12-16: 10 mg via INTRAVENOUS

## 2016-12-16 MED ORDER — LIDOCAINE-EPINEPHRINE 1 %-1:100000 IJ SOLN
INTRAMUSCULAR | Status: AC
  Filled 2016-12-16: qty 1

## 2016-12-16 MED ORDER — MIDAZOLAM HCL 2 MG/2ML IJ SOLN
1.0000 mg | INTRAMUSCULAR | Status: DC | PRN
  Administered 2016-12-16: 2 mg via INTRAVENOUS

## 2016-12-16 MED ORDER — OXYCODONE-ACETAMINOPHEN 5-325 MG PO TABS: 1.0000 | ORAL_TABLET | ORAL | 0 refills | Status: DC | PRN

## 2016-12-16 MED ORDER — AMOXICILLIN 875 MG PO TABS: 875.0000 mg | ORAL_TABLET | Freq: Two times a day (BID) | ORAL | 0 refills | Status: DC

## 2016-12-16 MED ORDER — SUCCINYLCHOLINE CHLORIDE 20 MG/ML IJ SOLN
INTRAMUSCULAR | Status: DC | PRN
  Administered 2016-12-16: 100 mg via INTRAVENOUS

## 2016-12-16 MED ORDER — PROPOFOL 10 MG/ML IV BOLUS
INTRAVENOUS | Status: DC | PRN
Start: 2016-12-16 — End: 2016-12-16
  Administered 2016-12-16: 200 mg via INTRAVENOUS

## 2016-12-16 MED ORDER — OXYMETAZOLINE HCL 0.05 % NA SOLN
NASAL | Status: DC | PRN
  Administered 2016-12-16: 1 via TOPICAL

## 2016-12-16 MED ORDER — CEFAZOLIN SODIUM-DEXTROSE 2-4 GM/100ML-% IV SOLN
INTRAVENOUS | Status: AC
  Filled 2016-12-16: qty 100

## 2016-12-16 MED ORDER — OXYMETAZOLINE HCL 0.05 % NA SOLN
NASAL | Status: AC
  Filled 2016-12-16: qty 15

## 2016-12-16 MED ORDER — LACTATED RINGERS IV SOLN
INTRAVENOUS | Status: DC
  Administered 2016-12-16: 11:00:00 via INTRAVENOUS

## 2016-12-16 MED ORDER — PHENYLEPHRINE 40 MCG/ML (10ML) SYRINGE FOR IV PUSH (FOR BLOOD PRESSURE SUPPORT)
PREFILLED_SYRINGE | INTRAVENOUS | Status: AC
  Filled 2016-12-16: qty 10

## 2016-12-16 MED ORDER — FENTANYL CITRATE (PF) 100 MCG/2ML IJ SOLN
INTRAMUSCULAR | Status: AC
  Filled 2016-12-16: qty 2

## 2016-12-16 MED ORDER — DEXAMETHASONE SODIUM PHOSPHATE 10 MG/ML IJ SOLN
INTRAMUSCULAR | Status: AC
  Filled 2016-12-16: qty 1

## 2016-12-16 MED ORDER — MIDAZOLAM HCL 2 MG/2ML IJ SOLN
INTRAMUSCULAR | Status: AC
  Filled 2016-12-16: qty 2

## 2016-12-16 SURGICAL SUPPLY — 36 items
ATTRACTOMAT 16X20 MAGNETIC DRP (DRAPES) IMPLANT
CANISTER SUCT 1200ML W/VALVE (MISCELLANEOUS) ×2 IMPLANT
COAGULATOR SUCT 8FR VV (MISCELLANEOUS) ×2 IMPLANT
DECANTER SPIKE VIAL GLASS SM (MISCELLANEOUS) IMPLANT
DRSG NASOPORE 8CM (GAUZE/BANDAGES/DRESSINGS) IMPLANT
DRSG TELFA 3X8 NADH (GAUZE/BANDAGES/DRESSINGS) IMPLANT
ELECT REM PT RETURN 9FT ADLT (ELECTROSURGICAL) ×2
ELECTRODE REM PT RTRN 9FT ADLT (ELECTROSURGICAL) ×1 IMPLANT
GLOVE BIO SURGEON STRL SZ7.5 (GLOVE) ×2 IMPLANT
GLOVE BIOGEL PI IND STRL 7.0 (GLOVE) IMPLANT
GLOVE BIOGEL PI INDICATOR 7.0 (GLOVE) ×1
GLOVE SURG SYN 7.5  E (GLOVE) ×1
GLOVE SURG SYN 7.5 E (GLOVE) ×1 IMPLANT
GLOVE SURG SYN 7.5 PF PI (GLOVE) IMPLANT
GOWN STRL REUS W/ TWL LRG LVL3 (GOWN DISPOSABLE) ×2 IMPLANT
GOWN STRL REUS W/TWL LRG LVL3 (GOWN DISPOSABLE) ×4
NDL HYPO 25X1 1.5 SAFETY (NEEDLE) ×1 IMPLANT
NEEDLE HYPO 25X1 1.5 SAFETY (NEEDLE) ×2 IMPLANT
NS IRRIG 1000ML POUR BTL (IV SOLUTION) ×2 IMPLANT
PACK BASIN DAY SURGERY FS (CUSTOM PROCEDURE TRAY) ×2 IMPLANT
PACK ENT DAY SURGERY (CUSTOM PROCEDURE TRAY) ×2 IMPLANT
PAD DRESSING TELFA 3X8 NADH (GAUZE/BANDAGES/DRESSINGS) IMPLANT
SLEEVE SCD COMPRESS KNEE MED (MISCELLANEOUS) ×1 IMPLANT
SOLUTION BUTLER CLEAR DIP (MISCELLANEOUS) ×2 IMPLANT
SPLINT NASAL AIRWAY SILICONE (MISCELLANEOUS) ×1 IMPLANT
SPONGE GAUZE 2X2 8PLY STRL LF (GAUZE/BANDAGES/DRESSINGS) ×2 IMPLANT
SPONGE NEURO XRAY DETECT 1X3 (DISPOSABLE) ×2 IMPLANT
SUT CHROMIC 4 0 P 3 18 (SUTURE) ×2 IMPLANT
SUT PLAIN 4 0 ~~LOC~~ 1 (SUTURE) ×2 IMPLANT
SUT PROLENE 3 0 PS 2 (SUTURE) ×2 IMPLANT
SUT VIC AB 4-0 P-3 18XBRD (SUTURE) IMPLANT
SUT VIC AB 4-0 P3 18 (SUTURE)
TOWEL OR 17X24 6PK STRL BLUE (TOWEL DISPOSABLE) ×2 IMPLANT
TUBE SALEM SUMP 12R W/ARV (TUBING) IMPLANT
TUBE SALEM SUMP 16 FR W/ARV (TUBING) ×2 IMPLANT
YANKAUER SUCT BULB TIP NO VENT (SUCTIONS) ×2 IMPLANT

## 2016-12-16 NOTE — Anesthesia Preprocedure Evaluation (Signed)
Anesthesia Evaluation  Patient identified by MRN, date of birth, ID band Patient awake    Reviewed: Allergy & Precautions, NPO status , Patient's Chart, lab work & pertinent test results, reviewed documented beta blocker date and time   History of Anesthesia Complications Negative for: history of anesthetic complications  Airway Mallampati: II  TM Distance: >3 FB Neck ROM: Full    Dental  (+) Teeth Intact   Pulmonary sleep apnea , former smoker,    breath sounds clear to auscultation       Cardiovascular hypertension, Pt. on medications and Pt. on home beta blockers (-) angina(-) Past MI and (-) CHF + dysrhythmias Atrial Fibrillation  Rhythm:Regular     Neuro/Psych negative neurological ROS  negative psych ROS   GI/Hepatic negative GI ROS, Neg liver ROS,   Endo/Other  negative endocrine ROS  Renal/GU negative Renal ROS     Musculoskeletal negative musculoskeletal ROS (+)   Abdominal   Peds  Hematology anticoag for afib   Anesthesia Other Findings   Reproductive/Obstetrics                             Anesthesia Physical Anesthesia Plan  ASA: II  Anesthesia Plan: General   Post-op Pain Management:    Induction: Intravenous  PONV Risk Score and Plan: 2 and Ondansetron and Dexamethasone  Airway Management Planned: Oral ETT  Additional Equipment: None  Intra-op Plan:   Post-operative Plan: Extubation in OR  Informed Consent: I have reviewed the patients History and Physical, chart, labs and discussed the procedure including the risks, benefits and alternatives for the proposed anesthesia with the patient or authorized representative who has indicated his/her understanding and acceptance.   Dental advisory given  Plan Discussed with: CRNA and Surgeon  Anesthesia Plan Comments:         Anesthesia Quick Evaluation

## 2016-12-16 NOTE — Transfer of Care (Signed)
Immediate Anesthesia Transfer of Care Note  Patient: Steve Weaver  Procedure(s) Performed: Procedure(s): NASAL SEPTOPLASTY WITH BILATERAL TURBINATE REDUCTION (Bilateral)  Patient Location: PACU  Anesthesia Type:General  Level of Consciousness: sedated  Airway & Oxygen Therapy: Patient Spontanous Breathing and Patient connected to face mask oxygen  Post-op Assessment: Report given to RN and Post -op Vital signs reviewed and stable  Post vital signs: Reviewed and stable  Last Vitals:  Vitals:   12/16/16 0953 12/16/16 1215  BP: (!) 169/82   Pulse: 83 73  Resp: 18 14  Temp: 36.9 C (P) 36.4 C    Last Pain: There were no vitals filed for this visit.       Complications: No apparent anesthesia complications

## 2016-12-16 NOTE — Op Note (Signed)
DATE OF PROCEDURE: 12/16/2016  OPERATIVE REPORT   SURGEON: Leta Baptist, MD   PREOPERATIVE DIAGNOSES:  1. Severe nasal septal deviation.  2. Bilateral inferior turbinate hypertrophy.  3. Chronic nasal obstruction.  POSTOPERATIVE DIAGNOSES:  1. Severe nasal septal deviation.  2. Bilateral inferior turbinate hypertrophy.  3. Chronic nasal obstruction.  PROCEDURE PERFORMED:  1. Septoplasty.  2. Bilateral partial inferior turbinate resection.   ANESTHESIA: General endotracheal tube anesthesia.   COMPLICATIONS: None.   ESTIMATED BLOOD LOSS: 50 mL.   INDICATION FOR PROCEDURE: Steve Weaver is a 66 y.o. male with a history of chronic nasal obstruction. The patient was  treated with antihistamine, decongestant, steroid nasal spray, and systemic steroids. However, the patient continues to be symptomatic. On examination, the patient was noted to have bilateral severe inferior turbinate hypertrophy and significant nasal septal deviation, causing significant nasal obstruction. Based on the above findings, the decision was made for the patient to undergo the above-stated procedure. The risks, benefits, alternatives, and details of the procedure were discussed with the patient. Questions were invited and answered. Informed consent was obtained.   DESCRIPTION OF PROCEDURE: The patient was taken to the operating room and placed supine on the operating table. General endotracheal tube anesthesia was administered by the anesthesiologist. The patient was positioned, and prepped and draped in the standard fashion for nasal surgery. Pledgets soaked with Afrin were placed in both nasal cavities for decongestion. The pledgets were subsequently removed. The above mentioned severe septal deviation was again noted. 1% lidocaine with 1:100,000 epinephrine was injected onto the nasal septum bilaterally. A hemitransfixion incision was made on the left side. The mucosal flap was carefully elevated on the left side. A  cartilaginous incision was made 1 cm superior to the caudal margin of the nasal septum. Mucosal flap was also elevated on the right side in the similar fashion. It should be noted that due to the severe septal deviation, the deviated portion of the cartilaginous and bony septum had to be removed in piecemeal fashion. Once the deviated portions were removed, a straight midline septum was achieved. The septum was then quilted with 4-0 plain gut sutures. The hemitransfixion incision was closed with interrupted 4-0 chromic sutures. Doyle splints were applied.   Prior to the [facility] splint application, the inferior one half of both hypertrophied inferior turbinate was crossclamped with a Kelly clamp. The inferior one half of each inferior turbinate was then resected with a pair of cross cutting scissors. Hemostasis was achieved with a suction cautery device.   The care of the patient was turned over to the anesthesiologist. The patient was awakened from anesthesia without difficulty. The patient was extubated and transferred to the recovery room in good condition.   OPERATIVE FINDINGS: Severe nasal septal deviation and bilateral inferior turbinate hypertrophy.   SPECIMEN: None.   FOLLOWUP CARE: The patient be discharged home once he is awake and alert. The patient will be placed on Percocet 1 tablets p.o. q.4 hours p.r.n. pain, and amoxicillin 875 mg p.o. b.i.d. for 5 days. The patient will follow up in my office in approximately 1 week for splint removal.   Steve Rakes Raynelle Bring, MD

## 2016-12-16 NOTE — Anesthesia Procedure Notes (Signed)
Procedure Name: Intubation Date/Time: 12/16/2016 10:56 AM Performed by: Lieutenant Diego Pre-anesthesia Checklist: Patient identified, Emergency Drugs available, Suction available and Patient being monitored Patient Re-evaluated:Patient Re-evaluated prior to inductionOxygen Delivery Method: Circle system utilized Preoxygenation: Pre-oxygenation with 100% oxygen Intubation Type: IV induction Ventilation: Mask ventilation without difficulty Laryngoscope Size: Miller and 2 Grade View: Grade I Tube type: Oral Tube size: 8.0 mm Number of attempts: 1 Airway Equipment and Method: Stylet and Oral airway Placement Confirmation: ETT inserted through vocal cords under direct vision,  positive ETCO2 and breath sounds checked- equal and bilateral Secured at: 24 cm Tube secured with: Tape Dental Injury: Teeth and Oropharynx as per pre-operative assessment

## 2016-12-16 NOTE — Discharge Instructions (Addendum)
POSTOPERATIVE INSTRUCTIONS FOR PATIENTS HAVING NASAL OR SINUS OPERATIONS ACTIVITY: Restrict activity at home for the first two days, resting as much as possible. Light activity is best. You may usually return to work within a week. You should refrain from nose blowing, strenuous activity, or heavy lifting greater than 20lbs for a total of three weeks after your operation.  If sneezing cannot be avoided, sneeze with your mouth open. DISCOMFORT: You may experience a dull headache and pressure along with nasal congestion and discharge. These symptoms may be worse during the first week after the operation but may last as long as two to four weeks.  Please take Tylenol or the pain medication that has been prescribed for you. Do not take aspirin or aspirin containing medications since they may cause bleeding.  You may experience symptoms of post nasal drainage, nasal congestion, headaches and fatigue for two or three months after your operation.  BLEEDING: You may have some blood tinged nasal drainage for approximately two weeks after the operation.  The discharge will be worse for the first week.  Please call our office at [phone] or go to the nearest hospital emergency room if you experience any of the following: heavy, bright red blood from your nose or mouth that lasts longer than ten minutes or coughing up or vomiting bright red blood or blood clots. GENERAL CONSIDERATIONS: 1. A gauze dressing will be placed on your upper lip to absorb any drainage after the operation. You may need to change this several times a day.  If you do not have very much drainage, you may remove the dressing.  Remember that you may gently wipe your nose with a tissue and sniff in, but DO NOT blow your nose. 2. Please keep all of your postoperative appointments.  Your final results after the operation will depend on proper follow-up.  The initial visit is usually four to seven days after the operation.  During this visit, the  remaining nasal packing and internal septal splints will be removed.  Your nasal and sinus cavities will be cleaned.  During the second visit, your nasal and sinus cavities will be cleaned again. Have someone drive you to your first two postoperative appointments. We suggest that you take your prescribed pain medication about  hour prior to each of these two appointments.  3. How you care for your nose after the operation will influence the results that you obtain.  You should follow all directions, take your medication as prescribed, and call our office [phone] with any problems or questions. 4. You may be more comfortable sleeping with your head elevated on two pillows. 5. Do not take any medications that we have not prescribed or recommended. WARNING SIGNS: if any of the following should occur, please call our office: 1. Bright red bleeding which lasts more than 10 minutes. 2. Persistent fever greater than 102F. 3. Persistent vomiting. 4. Severe and constant pain that is not relieved by prescribed pain medication. 5. Trauma to the nose. 6. Rash or unusual side effects from any medicines.  Postoperative Anesthesia Instructions-Pediatric  Activity: Your child should rest for the remainder of the day. A responsible individual must stay with your child for 24 hours.  Meals: Your child should start with liquids and light foods such as gelatin or soup unless otherwise instructed by the physician. Progress to regular foods as tolerated. Avoid spicy, greasy, and heavy foods. If nausea and/or vomiting occur, drink only clear liquids such as apple juice or Pedialyte until  the nausea and/or vomiting subsides. Call your physician if vomiting continues.  Special Instructions/Symptoms: Your child may be drowsy for the rest of the day, although some children experience some hyperactivity a few hours after the surgery. Your child may also experience some irritability or crying episodes due to the  operative procedure and/or anesthesia. Your child's throat may feel dry or sore from the anesthesia or the breathing tube placed in the throat during surgery. Use throat lozenges, sprays, or ice chips if needed.

## 2016-12-16 NOTE — Anesthesia Postprocedure Evaluation (Signed)
Anesthesia Post Note  Patient: Steve Weaver  Procedure(s) Performed: Procedure(s) (LRB): NASAL SEPTOPLASTY WITH BILATERAL TURBINATE REDUCTION (Bilateral)     Patient location during evaluation: PACU Anesthesia Type: General Level of consciousness: awake and alert Pain management: pain level controlled Vital Signs Assessment: post-procedure vital signs reviewed and stable Respiratory status: spontaneous breathing, nonlabored ventilation, respiratory function stable and patient connected to nasal cannula oxygen Cardiovascular status: blood pressure returned to baseline and stable Postop Assessment: no signs of nausea or vomiting Anesthetic complications: no    Last Vitals:  Vitals:   12/16/16 1245 12/16/16 1300  BP: (!) 147/94 (!) 143/93  Pulse: (!) 36 77  Resp: 15 18  Temp:      Last Pain:  Vitals:   12/16/16 1300  PainSc: 0-No pain                 Gretel Cantu

## 2016-12-16 NOTE — H&P (Signed)
Cc: Chronic nasal obstruction  HPI: The patient is a 66 year old male who returns today for his follow-up evaluation.  He was last seen 1 year ago.  At that time, he was noted to have persistent nasal obstruction, secondary to nasal septal deviation and bilateral inferior turbinate hypertrophy.  More than 95% of his nasal passageways were obstructed.  The patient was treated with allergy medications and Flonase nasal spray.  However, he continued to be symptomatic.  The patient returns today complaining of difficulty sleeping secondary to his nasal obstruction.  He denies any facial pain, purulent drainage or fever.    Exam: The nasal cavities were decongested and anesthetised with a combination of oxymetazoline and 4% lidocaine solution.  The flexible scope was inserted into the right nasal cavity.  Endoscopy of the inferior and middle meatus was performed.  Edematous mucosa was noted.  No polyp, mass, or lesion was appreciated. NSD noted.  Olfactory cleft was clear.  Nasopharynx was clear.  Turbinates were hypertrophied but without mass.  Incomplete response to decongestion.  The procedure was repeated on the contralateral side with similar findings.  The patient tolerated the procedure well.  Instructions were given to avoid eating or drinking for 2 hours.    Assessment: 1.  Bidirectional nasal septal deviation and bilateral inferior turbinate hypertrophy, causing significant bilateral nasal obstruction.  2.  No polyps, mass, or lesion is noted today.  No acute infection is noted.    Plan: 1.  The nasal endoscopy findings are reviewed with the patient.  2. Based on his persistent symptoms, he may benefit from undergoing septoplasty and turbinate reduction to improve his nasal passageways.  The risks, benefits, alternatives and details of the procedure are reviewed with the patient.  3.  The patient would like to proceed with the procedures.

## 2016-12-17 ENCOUNTER — Encounter (HOSPITAL_BASED_OUTPATIENT_CLINIC_OR_DEPARTMENT_OTHER): Payer: Self-pay | Admitting: Otolaryngology

## 2016-12-30 ENCOUNTER — Encounter: Payer: Self-pay | Admitting: Internal Medicine

## 2016-12-30 ENCOUNTER — Ambulatory Visit (INDEPENDENT_AMBULATORY_CARE_PROVIDER_SITE_OTHER): Payer: PPO | Admitting: Internal Medicine

## 2016-12-30 VITALS — BP 128/80 | HR 85 | Ht 69.0 in

## 2016-12-30 DIAGNOSIS — I10 Essential (primary) hypertension: Secondary | ICD-10-CM | POA: Diagnosis not present

## 2016-12-30 DIAGNOSIS — E785 Hyperlipidemia, unspecified: Secondary | ICD-10-CM | POA: Diagnosis not present

## 2016-12-30 DIAGNOSIS — I481 Persistent atrial fibrillation: Secondary | ICD-10-CM

## 2016-12-30 DIAGNOSIS — J3489 Other specified disorders of nose and nasal sinuses: Secondary | ICD-10-CM

## 2016-12-30 DIAGNOSIS — I4819 Other persistent atrial fibrillation: Secondary | ICD-10-CM

## 2016-12-30 DIAGNOSIS — G4730 Sleep apnea, unspecified: Secondary | ICD-10-CM | POA: Diagnosis not present

## 2016-12-30 NOTE — Patient Instructions (Addendum)
Your physician wants you to follow-up in: 1 year or sooner if needed. You will receive a reminder letter in the mail two months in advance. If you don't receive a letter, please call our office to schedule the follow-up appointment.   If you need a refill on your cardiac medications before your next appointment, please call your pharmacy.   

## 2016-12-30 NOTE — Progress Notes (Signed)
OFFICE NOTE  Chief Complaint:  Breathing better  Primary Care Physician: System, Provider Not In  HPI:  Steve Weaver is a pleasant 66 year old male who has been experiencing some increasing fatigue with exertion and decreased exercise tolerance over the past several years. 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 He has since struggled, working a number of jobs in order to make ends meet.  He used to be very physically active however recently has stopped exercising and has had about 20 pounds of weight gain. He found that he's been more fatigued during certain activities for example planting some bushes in the yard caused him more shortness of breath and fatigue that he was typically use to. He was recently seen for a well check by his primary care provider, including followup on his hypertension and dyslipidemia. He was noted to have an irregular heartbeat an EKG performed apparently showed premature ventricular contractions. I subsequently received the EKG which shows premature atrial contractions. An EKG performed in our office today also shows PACs, for which she is not completely aware of and it is unclear whether this is new or old.  He denies any specific angina.  [name] underwent an echocardiogram and nuclear stress test in our office at the end of October and early November. The echocardiogram demonstrated normal systolic function with mild aortic insufficiency and mild mitral insufficiency. There was borderline right atrial enlargement.  He was reportedly very nervous about a stress test and at the onset was noted to be in a rapid heart rate of 150 which was regular and there were clear flutter waves. This terminated spontaneously. Perfusion imaging did not demonstrate any defects.  [name] stopped his lisinopril due to what he felt was low blood pressures. He brought in a list of his blood pressures  today and 8 do seem to be pretty well controlled at home. He reports some of his symptoms have improved. He denies any chest pain or significant recurrent palpitations.  I saw [name] back today in the office. He reports no complaints. He denies any chest pain, shortness of breath or worsening fatigue. Blood pressure was mildly elevated today 130/92. EKG shows atrial flutter with variable ventricular response at 119. He is completely unaware of this. He does have a history of paroxysmal atrial flutter in the past but it seems to be remotely. He is now 66 inches CHADSVASC score is 2. He is currently on aspirin only.  [name] for follow-up of atrial fibrillation. Unfortunately his cardioversion was not successful. We did have him undergo nuclear stress testing which showed no evidence of ischemia although the study was gated while he was in A. fib and his EF was reduced. I suspect this is a gating abnormality. We confirmed normal LV function by echo, however did show mild AI and MR. These findings are considered insignificant when contemplating using a 1C antiarrhythmic. We discussed options and I feel that our best chance of success in achieving sinus rhythm would be with antiarrhythmic therapy. I would recommend he start on flecainide 50 mg twice a day. He should continue Eliquis. He will need an EKG in 1 week after starting flecainide.  10/16/2015  [name] for follow-up. He underwent cardioversion for atrial fibrillation. He says that afterwards he felt significantly better and continues to feel more energized. However today I told him that he is back  in atrial flutter. This tells me that he may not be truly symptomatic with A. Fib/flutter. Obviously the flecainide was not sufficient enough to hold him and rhythm. We discussed options today including continuing to pursue rhythm control or perhaps rate control if he is truly asymptomatic. We could also consider referral for  ablation, although he since he's had A. fib and atrial flutter, while he may be able to be treated with regards to flutter, he may continue to have atrial fibrillation.  12/25/2015  [name] was seen today in follow-up. I maintain him on an increased dose of metoprolol which seems to have controlled his ventricular response. His heart rate is irregular today probably indicating persistent atrial fibrillation. He is on Eliquis without any bleeding problems. He continues to be asymptomatic therefore we will continue a rate control strategy. His recent sleep study came back abnormal suggestive of sleep apnea. He says this is probably related to upper airway problems as he has a history of nasal fracture and college which was significant and he has a deviated nasal septum. This is never been evaluated by an ENT doctor.  07/01/2016  [name] was seen today in follow-up. Recently I cleared him for ENT surgery however he is not yet undergone that. Blood pressure was elevated today 142/96 have her recheck was 138/80. He is overdue for a lipid profile and metabolic profile. He is in atrial fibrillation today this may be persistent and we have not pursued any further attempts for achieving sinus rhythm since he is completely asymptomatic. Heart rate is well-controlled. He is tolerating Eliquis without any bleeding problems.  12/30/2016  [name] for follow-up. He is doing really well. He just underwent nasal septoplasty. He says he is doing extremely well since then with marked improvement in his breathing and better oxygenation. His wife reports that he does not snore at night anymore. He remains in A. fib which is long-standing persistent at this point but he is asymptomatic with it. He is on Eliquis for CHADSVASC score 2. Blood pressure was well-controlled today. His cholesterol was at goal. He denies any chest pain or worsening shortness of breath.  PMHx:  Past Medical History:  Diagnosis  Date  . Hyperlipidemia   . Hypertension   . PAF (paroxysmal atrial fibrillation) ([place])     Past Surgical History:  Procedure Laterality Date  . BROKE LEG  11/1998  . CARDIOVERSION N/A 06/08/2015   Procedure: CARDIOVERSION;  Surgeon: Pixie Casino, MD;  Location: [facility] ENDOSCOPY;  Service: Cardiovascular;  Laterality: N/A;  . CARDIOVERSION N/A 09/11/2015   Procedure: CARDIOVERSION;  Surgeon: Pixie Casino, MD;  Location: [facility] ENDOSCOPY;  Service: Cardiovascular;  Laterality: N/A;  . NASAL SEPTOPLASTY W/ TURBINOPLASTY Bilateral 12/16/2016   Procedure: NASAL SEPTOPLASTY WITH BILATERAL TURBINATE REDUCTION;  Surgeon: Leta Baptist, MD;  Location: [place];  Service: ENT;  Laterality: Bilateral;  . NM MYOCAR PERF WALL MOTION  05/04/2013   bruce myoview - normal stress nuclear study; a-flutter & a-fib with RVR, EF 59%    FAMHx:  Family History  Problem Relation Age of Onset  . Hyperlipidemia Mother   . Hypertension Mother   . Emphysema Mother   . Diabetes Father   . Dementia Father   . Anemia Neg Hx   . Arrhythmia Neg Hx   . Asthma Neg Hx   . Clotting disorder Neg Hx   . Fainting Neg Hx   . Heart attack Neg Hx   .  Heart disease Neg Hx   . Heart failure Neg Hx     SOCHx:   reports that he has quit smoking. His smoking use included Cigars. He has never used smokeless tobacco. He reports that he drinks about 3.6 oz of alcohol per week . He reports that he does not use drugs.  ALLERGIES:  Allergies  Allergen Reactions  . Other Rash    Muscle Relaxer (unsure of name)    ROS: Pertinent items noted in HPI and remainder of comprehensive ROS otherwise negative.  HOME MEDS: Current Outpatient Prescriptions  Medication Sig Dispense Refill  . Coenzyme Q10 (CO Q 10 PO) Take 1 capsule by mouth daily.    [name] diltiazem (CARDIZEM CD) 120 MG 24 hr capsule Take 1 capsule (120 mg total) by mouth daily. 90 capsule 3  . diltiazem (CARDIZEM CD) 120 MG 24 hr capsule TAKE ONE CAPSULE BY  MOUTH EVERY DAY 30 capsule 1  . ELIQUIS 5 MG TABS tablet     . lovastatin (MEVACOR) 40 MG tablet Take 1 tablet (40 mg total) by mouth daily. 90 tablet 0  . metoprolol tartrate (LOPRESSOR) 25 MG tablet Take 1.5 tablets (37.5 mg total) by mouth 2 (two) times daily. 270 tablet 0  . Multiple Vitamin (MULTIVITAMIN) capsule Take 1 capsule by mouth daily.     No current facility-administered medications for this visit.     LABS/IMAGING: No results found for this or any previous visit (from the past 48 hour(s)). No results found.  VITALS: BP (!) 160/91   Pulse 85   Ht 5\' 9"  (1.753 m)   EXAM: General appearance: alert and no distress Neck: no carotid bruit and no JVD Lungs: clear to auscultation bilaterally Heart: irregularly irregular rhythm Abdomen: soft, non-tender; bowel sounds normal; no masses,  no organomegaly Extremities: extremities normal, atraumatic, no cyanosis or edema Pulses: 2+ and symmetric Skin: Skin color, texture, turgor normal. No rashes or lesions Neurologic: Grossly normal Psych: Pleasant  EKG: A. fib at 85  ASSESSMENT: 1. Persistent atrial fibrillation / flutter, with rapid ventricular response - CHADS2VASC score of 2 2. Hypertension 3. Dyslipidemia 4. OSA-snoring improved after septal surgery 5. History of nasal septal deviation - s/p septoplasty (12/2016)  PLAN: 1.   [name] has had marked improvement in his breathing after nasal septoplasty. He is in atrial fibrillation today which may be persistent. He is unaware of any palpitations. He's tolerating Eliquis without any bleeding problems. Blood pressure is controlled.  Follow-up in 1 year.  Pixie Casino, MD, [facility] Attending Cardiologist Davidson C Damire Remedios 12/30/2016, 9:56 AM

## 2017-01-12 ENCOUNTER — Other Ambulatory Visit: Payer: Self-pay | Admitting: Internal Medicine

## 2017-01-12 DIAGNOSIS — I4892 Unspecified atrial flutter: Secondary | ICD-10-CM

## 2017-02-15 ENCOUNTER — Other Ambulatory Visit: Payer: Self-pay | Admitting: Internal Medicine

## 2017-03-18 ENCOUNTER — Other Ambulatory Visit: Payer: Self-pay | Admitting: Internal Medicine

## 2017-04-16 ENCOUNTER — Other Ambulatory Visit: Payer: Self-pay | Admitting: Internal Medicine

## 2017-04-16 DIAGNOSIS — I4892 Unspecified atrial flutter: Secondary | ICD-10-CM

## 2017-04-16 NOTE — Telephone Encounter (Signed)
Rx has been sent to the pharmacy electronically. ° °

## 2017-07-01 ENCOUNTER — Other Ambulatory Visit: Payer: Self-pay | Admitting: Internal Medicine

## 2017-07-01 DIAGNOSIS — I4892 Unspecified atrial flutter: Secondary | ICD-10-CM

## 2017-07-01 MED ORDER — DILTIAZEM HCL ER COATED BEADS 120 MG PO CP24: 120.0000 mg | ORAL_CAPSULE | Freq: Every day | ORAL | 1 refills | Status: DC

## 2017-07-01 MED ORDER — APIXABAN 5 MG PO TABS: 5.0000 mg | ORAL_TABLET | Freq: Two times a day (BID) | ORAL | 1 refills | Status: DC

## 2017-07-01 MED ORDER — METOPROLOL TARTRATE 25 MG PO TABS: 37.5000 mg | ORAL_TABLET | Freq: Two times a day (BID) | ORAL | 1 refills | Status: DC

## 2017-07-01 MED ORDER — LOVASTATIN 40 MG PO TABS: 40.0000 mg | ORAL_TABLET | Freq: Every day | ORAL | 0 refills | Status: DC

## 2017-07-01 NOTE — Telephone Encounter (Signed)
Rx(s) sent to pharmacy electronically.  

## 2017-07-01 NOTE — Telephone Encounter (Signed)
°*  STAT* If patient is at the pharmacy, call can be transferred to refill team.   1. Which medications need to be refilled? (please list name of each medication and dose if known) Eliquis 5 mg, Lovastatin 40 mg, Metoprolol 25 mg and Diltiazem 120 mg  2. Which pharmacy/location (including street and city if local pharmacy) is medication to be sent to?[id number] - [place], [place] [address]  3. Do they need a 30 day or 90 day supply? [place]

## 2017-10-04 ENCOUNTER — Other Ambulatory Visit: Payer: Self-pay | Admitting: Internal Medicine

## 2017-10-06 NOTE — Telephone Encounter (Signed)
REFILL 

## 2017-12-27 ENCOUNTER — Other Ambulatory Visit: Payer: Self-pay | Admitting: Internal Medicine

## 2018-01-27 ENCOUNTER — Encounter: Payer: Self-pay | Admitting: Internal Medicine

## 2018-01-27 ENCOUNTER — Ambulatory Visit (INDEPENDENT_AMBULATORY_CARE_PROVIDER_SITE_OTHER): Payer: PPO | Admitting: Internal Medicine

## 2018-01-27 VITALS — BP 152/90 | HR 97 | Ht 68.0 in | Wt 208.8 lb

## 2018-01-27 DIAGNOSIS — I481 Persistent atrial fibrillation: Secondary | ICD-10-CM | POA: Diagnosis not present

## 2018-01-27 DIAGNOSIS — R0602 Shortness of breath: Secondary | ICD-10-CM | POA: Diagnosis not present

## 2018-01-27 DIAGNOSIS — Z8679 Personal history of other diseases of the circulatory system: Secondary | ICD-10-CM

## 2018-01-27 DIAGNOSIS — R5383 Other fatigue: Secondary | ICD-10-CM

## 2018-01-27 DIAGNOSIS — I4819 Other persistent atrial fibrillation: Secondary | ICD-10-CM

## 2018-01-27 NOTE — Progress Notes (Signed)
OFFICE NOTE  Chief Complaint:  Weight gain, fatigue, dyspnea  Primary Care Physician: System, Provider Not In  HPI:  Steve Weaver is a pleasant 67 year old male who has been experiencing some increasing fatigue with exertion and decreased exercise tolerance over the past several years. 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 He has since struggled, working a number of jobs in order to make ends meet.  He used to be very physically active however recently has stopped exercising and has had about 20 pounds of weight gain. He found that he's been more fatigued during certain activities for example planting some bushes in the yard caused him more shortness of breath and fatigue that he was typically use to. He was recently seen for a well check by his primary care provider, including followup on his hypertension and dyslipidemia. He was noted to have an irregular heartbeat an EKG performed apparently showed premature ventricular contractions. I subsequently received the EKG which shows premature atrial contractions. An EKG performed in our office today also shows PACs, for which she is not completely aware of and it is unclear whether this is new or old.  He denies any specific angina.  [name] underwent an echocardiogram and nuclear stress test in our office at the end of October and early November. The echocardiogram demonstrated normal systolic function with mild aortic insufficiency and mild mitral insufficiency. There was borderline right atrial enlargement.  He was reportedly very nervous about a stress test and at the onset was noted to be in a rapid heart rate of 150 which was regular and there were clear flutter waves. This terminated spontaneously. Perfusion imaging did not demonstrate any defects.  [name] stopped his lisinopril due to what he felt was low blood pressures. He brought in a list of his blood  pressures today and 8 do seem to be pretty well controlled at home. He reports some of his symptoms have improved. He denies any chest pain or significant recurrent palpitations.  I saw [name] back today in the office. He reports no complaints. He denies any chest pain, shortness of breath or worsening fatigue. Blood pressure was mildly elevated today 130/92. EKG shows atrial flutter with variable ventricular response at 119. He is completely unaware of this. He does have a history of paroxysmal atrial flutter in the past but it seems to be remotely. He is now 66 inches CHADSVASC score is 2. He is currently on aspirin only.  [name] for follow-up of atrial fibrillation. Unfortunately his cardioversion was not successful. We did have him undergo nuclear stress testing which showed no evidence of ischemia although the study was gated while he was in A. fib and his EF was reduced. I suspect this is a gating abnormality. We confirmed normal LV function by echo, however did show mild AI and MR. These findings are considered insignificant when contemplating using a 1C antiarrhythmic. We discussed options and I feel that our best chance of success in achieving sinus rhythm would be with antiarrhythmic therapy. I would recommend he start on flecainide 50 mg twice a day. He should continue Eliquis. He will need an EKG in 1 week after starting flecainide.  10/16/2015  [name] for follow-up. He underwent cardioversion for atrial fibrillation. He says that afterwards he felt significantly better and continues to feel more energized. However today I told him that he  is back in atrial flutter. This tells me that he may not be truly symptomatic with A. Fib/flutter. Obviously the flecainide was not sufficient enough to hold him and rhythm. We discussed options today including continuing to pursue rhythm control or perhaps rate control if he is truly asymptomatic. We could also consider  referral for ablation, although he since he's had A. fib and atrial flutter, while he may be able to be treated with regards to flutter, he may continue to have atrial fibrillation.  12/25/2015  [name] was seen today in follow-up. I maintain him on an increased dose of metoprolol which seems to have controlled his ventricular response. His heart rate is irregular today probably indicating persistent atrial fibrillation. He is on Eliquis without any bleeding problems. He continues to be asymptomatic therefore we will continue a rate control strategy. His recent sleep study came back abnormal suggestive of sleep apnea. He says this is probably related to upper airway problems as he has a history of nasal fracture and college which was significant and he has a deviated nasal septum. This is never been evaluated by an ENT doctor.  07/01/2016  [name] was seen today in follow-up. Recently I cleared him for ENT surgery however he is not yet undergone that. Blood pressure was elevated today 142/96 have her recheck was 138/80. He is overdue for a lipid profile and metabolic profile. He is in atrial fibrillation today this may be persistent and we have not pursued any further attempts for achieving sinus rhythm since he is completely asymptomatic. Heart rate is well-controlled. He is tolerating Eliquis without any bleeding problems.  12/30/2016  [name] for follow-up. He is doing really well. He just underwent nasal septoplasty. He says he is doing extremely well since then with marked improvement in his breathing and better oxygenation. His wife reports that he does not snore at night anymore. He remains in A. fib which is long-standing persistent at this point but he is asymptomatic with it. He is on Eliquis for CHADSVASC score 2. Blood pressure was well-controlled today. His cholesterol was at goal. He denies any chest pain or worsening shortness of breath.  01/27/2018  [name] returns  today for follow-up.  He is concerned about worsening dyspnea and fatigue over the past year.  He says he has become more short of breath and gained about 40 pounds, which he attributes to taking beta-blocker and/or the diltiazem since they are listed side effects.  He also reports more fatigue.  He wonders if this could be related to his A. fib.  In the past he is not noted any symptoms related to A. fib, which makes me think it may be his weight gain or other symptoms such as heart disease or heart failure that could be causing his symptoms.  Nevertheless he wants to try to come off the medications and inquired about other attempts to get him back into rhythm.  He had twice failed cardioversions in the past and therefore we had pursued rate control and anticoagulation for the past 4 years.  PMHx:  Past Medical History:  Diagnosis Date  . Hyperlipidemia   . Hypertension   . PAF (paroxysmal atrial fibrillation) ([place])     Past Surgical History:  Procedure Laterality Date  . BROKE LEG  11/1998  . CARDIOVERSION N/A 06/08/2015   Procedure: CARDIOVERSION;  Surgeon: Pixie Casino, MD;  Location: [facility] ENDOSCOPY;  Service: Cardiovascular;  Laterality: N/A;  . CARDIOVERSION N/A 09/11/2015  Procedure: CARDIOVERSION;  Surgeon: Pixie Casino, MD;  Location: [facility] ENDOSCOPY;  Service: Cardiovascular;  Laterality: N/A;  . NASAL SEPTOPLASTY W/ TURBINOPLASTY Bilateral 12/16/2016   Procedure: NASAL SEPTOPLASTY WITH BILATERAL TURBINATE REDUCTION;  Surgeon: Leta Baptist, MD;  Location: [place];  Service: ENT;  Laterality: Bilateral;  . NM MYOCAR PERF WALL MOTION  05/04/2013   bruce myoview - normal stress nuclear study; a-flutter & a-fib with RVR, EF 59%    FAMHx:  Family History  Problem Relation Age of Onset  . Hyperlipidemia Mother   . Hypertension Mother   . Emphysema Mother   . Diabetes Father   . Dementia Father   . Anemia Neg Hx   . Arrhythmia Neg Hx   . Asthma Neg Hx   . Clotting  disorder Neg Hx   . Fainting Neg Hx   . Heart attack Neg Hx   . Heart disease Neg Hx   . Heart failure Neg Hx     SOCHx:   reports that he has quit smoking. His smoking use included cigars. He has never used smokeless tobacco. He reports that he drinks about 6.0 standard drinks of alcohol per week. He reports that he does not use drugs.  ALLERGIES:  Allergies  Allergen Reactions  . Other Rash    Muscle Relaxer (unsure of name)    ROS: Pertinent items noted in HPI and remainder of comprehensive ROS otherwise negative.  HOME MEDS: Current Outpatient Medications  Medication Sig Dispense Refill  . apixaban (ELIQUIS) 5 MG TABS tablet Take 1 tablet (5 mg total) by mouth 2 (two) times daily. 180 tablet 1  . Coenzyme Q10 (CO Q 10 PO) Take 1 capsule by mouth daily.    [name] diltiazem (CARDIZEM CD) 120 MG 24 hr capsule Take 1 capsule (120 mg total) by mouth daily. 90 capsule 1  . lovastatin (MEVACOR) 40 MG tablet Take 1 tablet (40 mg total) by mouth daily. KEEP OV. 90 tablet 0  . metoprolol tartrate (LOPRESSOR) 25 MG tablet Take 1.5 tablets (37.5 mg total) by mouth 2 (two) times daily. 270 tablet 1  . Multiple Vitamin (MULTIVITAMIN) capsule Take 1 capsule by mouth daily.    [name] omega-3 fish oil (MAXEPA) 1000 MG CAPS capsule Take 2 capsules by mouth daily.     No current facility-administered medications for this visit.     LABS/IMAGING: No results found for this or any previous visit (from the past 48 hour(s)). No results found.  VITALS: BP (!) 152/90   Pulse 97   Ht 5\' 8"  (1.727 m)   Wt 208 lb 12.8 oz (94.7 kg)   BMI 31.75 kg/m   EXAM: General appearance: alert and no distress Neck: no carotid bruit and no JVD Lungs: clear to auscultation bilaterally Heart: irregularly irregular rhythm Abdomen: soft, non-tender; bowel sounds normal; no masses,  no organomegaly Extremities: extremities normal, atraumatic, no cyanosis or edema Pulses: 2+ and symmetric Skin: Skin color, texture,  turgor normal. No rashes or lesions Neurologic: Grossly normal Psych: Pleasant  EKG: Atrial fibrillation with controlled ventricular response at 97-personally reviewed  ASSESSMENT: 1. Persistent atrial fibrillation / flutter, with rapid ventricular response - CHADS2VASC score of 2 2. Hypertension 3. Dyslipidemia 4. OSA-snoring improved after septal surgery 5. History of nasal septal deviation - s/p septoplasty (12/2016)  PLAN: 1.   [name] is quite concerned that he has had 40 pound weight gain and he feels it may be related to metoprolol and/or diltiazem.  He is  also concerned that his fatigue is related to A. fib although he has been persistently in A. fib for approximately 4 years since we attempted to cardioversions which were not ultimately successful.  At the time he did have obstructive sleep apnea and snoring which improved after surgery for septal deviation in 2018.  I suspect his weight gain may be contributing to his fatigue and shortness of breath, however cannot exclude worsening coronary disease or heart failure.  He did have a stress test and echocardiogram in 2017.  I would like to repeat those today to rule out ischemic causes of his symptoms as well as any new cardiomyopathy.  Thereafter we will likely refer him to cardiac electrophysiology for evaluation of possible antiarrhythmic therapy and substitution for his rate control and repeat cardioversion versus evaluation for ablation if it is felt that he is symptomatic related to A. Fib.  Follow-up in 3 to 6 months.  Pixie Casino, MD, [facility], [place] Director of the [facility]  [facility] Diplomate of the American Board of Clinical Lipidology Attending Cardiologist  Direct Dial: [phone]  Fax: [phone]  Website:  [email or web address] 01/27/2018, 2:33 PM

## 2018-01-27 NOTE — Patient Instructions (Signed)
Dr. Debara Pickett has requested that you have an echocardiogram. Echocardiography is a painless test that uses sound waves to create images of your heart. It provides your doctor with information about the size and shape of your heart and how well your heart's chambers and valves are working. This procedure takes approximately one hour. There are no restrictions for this procedure. This is done at [address] - [address].  Dr. Debara Pickett has ordered a Lexiscan Myocardial Perfusion Imaging Study. This is done in Dr. Lysbeth Penner office. Instructions noted below.   You have been referred to Dr. Jeneen Rinks Allred ([address])  Your physician recommends that you schedule a follow-up appointment in 3 months with Dr. Debara Pickett.    Please arrive 15 minutes prior to your appointment time for registration and insurance purposes.   The test will take approximately 3 to 4 hours to complete; you may bring reading material.  If someone comes with you to your appointment, they will need to remain in the main lobby due to limited space in the testing area. **If you are pregnant or breastfeeding, please notify the nuclear lab prior to your appointment**   How to prepare for your Myocardial Perfusion Test:  Do not eat or drink 3 hours prior to your test, except you may have water.  Do not consume products containing caffeine (regular or decaffeinated) 12 hours prior to your test. (ex: coffee, chocolate, sodas, tea).  Do wear comfortable clothes (no dresses or overalls) and walking shoes, tennis shoes preferred (No heels or open toe shoes are allowed).  Do NOT wear cologne, perfume, aftershave, or lotions (deodorant is allowed).  If you use an inhaler, use it the AM of your test and bring it with you.   If you use a nebulizer, use it the AM of your test.   If these instructions are not followed, your test will have to be rescheduled.

## 2018-01-28 ENCOUNTER — Encounter (HOSPITAL_COMMUNITY): Payer: PPO

## 2018-01-30 ENCOUNTER — Telehealth (HOSPITAL_COMMUNITY): Payer: Self-pay

## 2018-01-30 NOTE — Telephone Encounter (Signed)
Encounter complete. 

## 2018-02-03 ENCOUNTER — Telehealth (HOSPITAL_COMMUNITY): Payer: Self-pay

## 2018-02-03 NOTE — Telephone Encounter (Signed)
Encounter complete. 

## 2018-02-04 ENCOUNTER — Other Ambulatory Visit: Payer: Self-pay

## 2018-02-04 ENCOUNTER — Ambulatory Visit (HOSPITAL_COMMUNITY)
Admission: RE | Admit: 2018-02-04 | Discharge: 2018-02-04 | Disposition: A | Discharge status: Home / Self Care | Payer: PPO | Source: Ambulatory Visit | Attending: Internal Medicine | Admitting: Internal Medicine

## 2018-02-04 ENCOUNTER — Ambulatory Visit (HOSPITAL_BASED_OUTPATIENT_CLINIC_OR_DEPARTMENT_OTHER): Payer: PPO

## 2018-02-04 DIAGNOSIS — R0602 Shortness of breath: Secondary | ICD-10-CM

## 2018-02-04 DIAGNOSIS — Z8679 Personal history of other diseases of the circulatory system: Secondary | ICD-10-CM

## 2018-02-04 DIAGNOSIS — I481 Persistent atrial fibrillation: Secondary | ICD-10-CM | POA: Diagnosis not present

## 2018-02-04 DIAGNOSIS — R5383 Other fatigue: Secondary | ICD-10-CM

## 2018-02-04 DIAGNOSIS — I4819 Other persistent atrial fibrillation: Secondary | ICD-10-CM

## 2018-02-04 LAB — MYOCARDIAL PERFUSION IMAGING
Peak HR: 125 {beats}/min
Rest HR: 86 {beats}/min
SDS: 2
SRS: 0
SSS: 2
TID: 1.06

## 2018-02-04 MED ORDER — TECHNETIUM TC 99M TETROFOSMIN IV KIT
31.8000 | PACK | Freq: Once | INTRAVENOUS | Status: AC | PRN
  Administered 2018-02-04: 31.8 via INTRAVENOUS
  Filled 2018-02-04: qty 32

## 2018-02-04 MED ORDER — TECHNETIUM TC 99M TETROFOSMIN IV KIT
10.2000 | PACK | Freq: Once | INTRAVENOUS | Status: AC | PRN
Start: 2018-02-04 — End: 2018-02-04
  Administered 2018-02-04: 10.2 via INTRAVENOUS
  Filled 2018-02-04: qty 11

## 2018-02-04 MED ORDER — REGADENOSON 0.4 MG/5ML IV SOLN
0.4000 mg | Freq: Once | INTRAVENOUS | Status: AC
  Administered 2018-02-04: 0.4 mg via INTRAVENOUS

## 2018-02-04 MED ORDER — PERFLUTREN LIPID MICROSPHERE
1.0000 mL | INTRAVENOUS | Status: AC | PRN
  Administered 2018-02-04: 2 mL via INTRAVENOUS

## 2018-02-11 ENCOUNTER — Institutional Professional Consult (permissible substitution): Payer: PPO | Admitting: Internal Medicine

## 2018-02-25 DIAGNOSIS — D126 Benign neoplasm of colon, unspecified: Secondary | ICD-10-CM | POA: Diagnosis not present

## 2018-02-25 DIAGNOSIS — Z125 Encounter for screening for malignant neoplasm of prostate: Secondary | ICD-10-CM | POA: Diagnosis not present

## 2018-02-25 DIAGNOSIS — R82998 Other abnormal findings in urine: Secondary | ICD-10-CM | POA: Diagnosis not present

## 2018-02-25 DIAGNOSIS — Z0000 Encounter for general adult medical examination without abnormal findings: Secondary | ICD-10-CM | POA: Diagnosis not present

## 2018-02-25 DIAGNOSIS — Z23 Encounter for immunization: Secondary | ICD-10-CM | POA: Diagnosis not present

## 2018-02-25 DIAGNOSIS — G4733 Obstructive sleep apnea (adult) (pediatric): Secondary | ICD-10-CM | POA: Diagnosis not present

## 2018-02-25 DIAGNOSIS — E7800 Pure hypercholesterolemia, unspecified: Secondary | ICD-10-CM | POA: Diagnosis not present

## 2018-02-25 DIAGNOSIS — Z7901 Long term (current) use of anticoagulants: Secondary | ICD-10-CM | POA: Diagnosis not present

## 2018-02-25 DIAGNOSIS — I10 Essential (primary) hypertension: Secondary | ICD-10-CM | POA: Diagnosis not present

## 2018-02-25 DIAGNOSIS — Z1389 Encounter for screening for other disorder: Secondary | ICD-10-CM | POA: Diagnosis not present

## 2018-02-25 DIAGNOSIS — Z6831 Body mass index (BMI) 31.0-31.9, adult: Secondary | ICD-10-CM | POA: Diagnosis not present

## 2018-02-25 DIAGNOSIS — I480 Paroxysmal atrial fibrillation: Secondary | ICD-10-CM | POA: Diagnosis not present

## 2018-02-26 ENCOUNTER — Encounter: Payer: Self-pay | Admitting: Internal Medicine

## 2018-02-26 ENCOUNTER — Ambulatory Visit: Payer: PPO | Admitting: Internal Medicine

## 2018-02-26 VITALS — BP 152/92 | HR 89 | Ht 67.0 in | Wt 204.8 lb

## 2018-02-26 DIAGNOSIS — E785 Hyperlipidemia, unspecified: Secondary | ICD-10-CM

## 2018-02-26 DIAGNOSIS — I4892 Unspecified atrial flutter: Secondary | ICD-10-CM | POA: Diagnosis not present

## 2018-02-26 DIAGNOSIS — G4730 Sleep apnea, unspecified: Secondary | ICD-10-CM

## 2018-02-26 DIAGNOSIS — I4819 Other persistent atrial fibrillation: Secondary | ICD-10-CM

## 2018-02-26 DIAGNOSIS — I10 Essential (primary) hypertension: Secondary | ICD-10-CM | POA: Diagnosis not present

## 2018-02-26 DIAGNOSIS — I481 Persistent atrial fibrillation: Secondary | ICD-10-CM | POA: Diagnosis not present

## 2018-02-26 NOTE — Patient Instructions (Addendum)
Medication Instructions:  Your physician recommends that you continue on your current medications as directed. Please refer to the Current Medication list given to you today.   Labwork: None ordered  Testing/Procedures: None ordered  Follow-Up: Your physician wants you to follow-up in: 6 months with Dr.Hilty You will receive a reminder letter in the mail two months in advance. If you don't receive a letter, please call our office to schedule the follow-up appointment.   Any Other Special Instructions Will Be Listed Below (If Applicable).     If you need a refill on your cardiac medications before your next appointment, please call your pharmacy.   

## 2018-02-26 NOTE — Progress Notes (Signed)
Electrophysiology Office Note   Date:  02/26/2018   ID:  Steve Weaver, Steve Weaver [birth year], MRN [id number]  PCP:  Haywood Pao, MD  Cardiologist:  Dr Debara Pickett Primary Electrophysiologist: Thompson Grayer, MD    CC: afib   History of Present Illness: Steve Weaver is a 67 y.o. male who presents today for electrophysiology evaluation.   He presents today for EP consultation regarding atrial fibrillation.  He  Has a h/o PACs, PVCs.  He presented previously to Dr Debara Pickett.  He has had afib documented by ekg since at least 2016.  He did have atrial flutter 10/16/15.  Dr Lysbeth Penner notes suggest that he was previously mostly asymptomatic with his afib.  He was started on flecainide in 2017.  EKGs continued to show afib however.  He has failed multiple prior cardioversions. He had issues with weight and gained 40 lbs.  In this setting, he began to have fatigue and decreased exercise tolerance.  Today, he denies symptoms of palpitations, chest pain,   orthopnea, PND, lower extremity edema, claudication, dizziness, presyncope, syncope, bleeding, or neurologic sequela. The patient is tolerating medications without difficulties and is otherwise without complaint today.    Past Medical History:  Diagnosis Date  . Hyperlipidemia   . Hypertension   . Longstanding persistent atrial fibrillation [facility])    Past Surgical History:  Procedure Laterality Date  . BROKE LEG  11/1998  . CARDIOVERSION N/A 06/08/2015   Procedure: CARDIOVERSION;  Surgeon: Pixie Casino, MD;  Location: [facility] ENDOSCOPY;  Service: Cardiovascular;  Laterality: N/A;  . CARDIOVERSION N/A 09/11/2015   Procedure: CARDIOVERSION;  Surgeon: Pixie Casino, MD;  Location: [facility] ENDOSCOPY;  Service: Cardiovascular;  Laterality: N/A;  . NASAL SEPTOPLASTY W/ TURBINOPLASTY Bilateral 12/16/2016   Procedure: NASAL SEPTOPLASTY WITH BILATERAL TURBINATE REDUCTION;  Surgeon: Leta Baptist, MD;  Location: [place];  Service: ENT;  Laterality:  Bilateral;  . NM MYOCAR PERF WALL MOTION  05/04/2013   bruce myoview - normal stress nuclear study; a-flutter & a-fib with RVR, EF 59%     Current Outpatient Medications  Medication Sig Dispense Refill  . apixaban (ELIQUIS) 5 MG TABS tablet Take 1 tablet (5 mg total) by mouth 2 (two) times daily. 180 tablet 1  . Coenzyme Q10 (CO Q 10 PO) Take 1 capsule by mouth daily.    [name] diltiazem (CARDIZEM CD) 120 MG 24 hr capsule Take 1 capsule (120 mg total) by mouth daily. 90 capsule 1  . lovastatin (MEVACOR) 40 MG tablet Take 1 tablet (40 mg total) by mouth daily. KEEP OV. 90 tablet 0  . metoprolol tartrate (LOPRESSOR) 25 MG tablet Take 1.5 tablets (37.5 mg total) by mouth 2 (two) times daily. 270 tablet 1  . Multiple Vitamin (MULTIVITAMIN) capsule Take 1 capsule by mouth daily.    [name] omega-3 fish oil (MAXEPA) 1000 MG CAPS capsule Take 2 capsules by mouth daily.     No current facility-administered medications for this visit.     Allergies:   Other   Social History:  The patient  reports that he has quit smoking. His smoking use included cigars. He has never used smokeless tobacco. He reports that he drinks about 6.0 standard drinks of alcohol per week. He reports that he does not use drugs.   Family History:  The patient's  family history includes Dementia in his father; Diabetes in his father; Emphysema in his mother; Hyperlipidemia in his mother; Hypertension in his mother.  ROS:  Please see the history of present illness.   All other systems are personally reviewed and negative.    PHYSICAL EXAM: VS:  BP (!) 152/92   Pulse 89   Ht 5\' 7"  (1.702 m)   Wt 204 lb 12.8 oz (92.9 kg)   SpO2 97%   BMI 32.08 kg/m  , BMI Body mass index is 32.08 kg/m. GEN: Well nourished, well developed, in no acute distress  HEENT: normal  Neck: no JVD, carotid bruits, or masses Cardiac: iRRR; no murmurs, rubs, or gallops,no edema  Respiratory:  clear to auscultation bilaterally, normal work of  breathing GI: soft, nontender, nondistended, + BS MS: no deformity or atrophy  Skin: warm and dry  Neuro:  Strength and sensation are intact Psych: euthymic mood, full affect  EKG:  EKG is ordered today. The ekg ordered today is personally reviewed and shows afib, V rate 89 bpm, nonspecific ST/T changes   Lipid Panel     Component Value Date/Time   CHOL 161 07/05/2016 0851   TRIG 164 (H) 07/05/2016 0851   HDL 44 07/05/2016 0851   CHOLHDL 3.7 07/05/2016 0851   VLDL 33 (H) 07/05/2016 0851   LDLCALC 84 07/05/2016 0851   personally reviewed   Wt Readings from Last 3 Encounters:  02/26/18 204 lb 12.8 oz (92.9 kg)  02/04/18 208 lb (94.3 kg)  01/27/18 208 lb 12.8 oz (94.7 kg)      Other studies personally reviewed: Additional studies/ records that were reviewed today include: Dr Lysbeth Penner notes, prior ekgs  Review of the above records today demonstrates: as above   ASSESSMENT AND PLAN:  1.  The patient has symptomatic, recurrent longstanding persistent atrial fibrillation.  He also has a h/o typical appearing atrial flutter he has failed medical therapy with flecainide.  He appears to have been in afib for about 4 years. Chads2vasc score is 2.  he is anticoagulated with eliquis. Therapeutic strategies for afib including rate control and rhythm control were discussed in detail with the patient today. Risk, benefits, and alternatives to tikosyn and amiodarone were discussed.  Given longstanding persistent afib,. I would not advise ablation at this time. At this time, he would prefer regular exercise and weight loss.  If he decides to consider tikosyn he will contact my office or follow-up in the AF clinic   Follow-up:  Dr Debara Pickett as scheduled  Current medicines are reviewed at length with the patient today.   The patient does not have concerns regarding his medicines.  The following changes were made today:  none  Labs/ tests ordered today include:  Orders Placed This Encounter   Procedures  . EKG 12-Lead     Signed, Thompson Grayer, MD  02/26/2018 9:40 AM     [facility] [address] [place] [address] [phone] (office) [phone] (fax)

## 2018-03-20 DIAGNOSIS — Z6830 Body mass index (BMI) 30.0-30.9, adult: Secondary | ICD-10-CM | POA: Diagnosis not present

## 2018-03-20 DIAGNOSIS — J029 Acute pharyngitis, unspecified: Secondary | ICD-10-CM | POA: Diagnosis not present

## 2018-03-20 DIAGNOSIS — J209 Acute bronchitis, unspecified: Secondary | ICD-10-CM | POA: Diagnosis not present

## 2018-03-20 DIAGNOSIS — R05 Cough: Secondary | ICD-10-CM | POA: Diagnosis not present

## 2018-03-20 DIAGNOSIS — J019 Acute sinusitis, unspecified: Secondary | ICD-10-CM | POA: Diagnosis not present

## 2018-03-24 ENCOUNTER — Other Ambulatory Visit: Payer: Self-pay | Admitting: Internal Medicine

## 2018-03-26 ENCOUNTER — Other Ambulatory Visit: Payer: Self-pay | Admitting: Internal Medicine

## 2018-03-31 ENCOUNTER — Other Ambulatory Visit: Payer: Self-pay | Admitting: Internal Medicine

## 2018-03-31 DIAGNOSIS — I4892 Unspecified atrial flutter: Secondary | ICD-10-CM

## 2018-03-31 NOTE — Telephone Encounter (Signed)
Rx request sent to pharmacy.  

## 2018-04-11 ENCOUNTER — Other Ambulatory Visit: Payer: Self-pay | Admitting: Internal Medicine

## 2018-04-13 DIAGNOSIS — Z6830 Body mass index (BMI) 30.0-30.9, adult: Secondary | ICD-10-CM | POA: Diagnosis not present

## 2018-04-13 DIAGNOSIS — J209 Acute bronchitis, unspecified: Secondary | ICD-10-CM | POA: Diagnosis not present

## 2018-04-13 DIAGNOSIS — J040 Acute laryngitis: Secondary | ICD-10-CM | POA: Diagnosis not present

## 2018-04-29 ENCOUNTER — Ambulatory Visit (INDEPENDENT_AMBULATORY_CARE_PROVIDER_SITE_OTHER): Payer: PPO | Admitting: Internal Medicine

## 2018-04-29 ENCOUNTER — Encounter: Payer: Self-pay | Admitting: Internal Medicine

## 2018-04-29 VITALS — BP 156/86 | HR 79 | Ht 68.0 in | Wt 194.6 lb

## 2018-04-29 DIAGNOSIS — Z79899 Other long term (current) drug therapy: Secondary | ICD-10-CM

## 2018-04-29 DIAGNOSIS — I4819 Other persistent atrial fibrillation: Secondary | ICD-10-CM

## 2018-04-29 DIAGNOSIS — I10 Essential (primary) hypertension: Secondary | ICD-10-CM

## 2018-04-29 MED ORDER — CHLORTHALIDONE 25 MG PO TABS: 12.5000 mg | ORAL_TABLET | Freq: Every day | ORAL | 3 refills | Status: DC

## 2018-04-29 NOTE — Progress Notes (Signed)
OFFICE NOTE  Chief Complaint:  Follow-up studies  Primary Care Physician: Tisovec, Fransico Him, MD  HPI:  Steve Weaver is a pleasant 67 year old male who has been experiencing some increasing fatigue with exertion and decreased exercise tolerance over the past several years. 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 He has since struggled, working a number of jobs in order to make ends meet.  He used to be very physically active however recently has stopped exercising and has had about 20 pounds of weight gain. He found that he's been more fatigued during certain activities for example planting some bushes in the yard caused Weaver more shortness of breath and fatigue that he was typically use to. He was recently seen for a well check by his primary care provider, including followup on his hypertension and dyslipidemia. He was noted to have an irregular heartbeat an EKG performed apparently showed premature ventricular contractions. I subsequently received the EKG which shows premature atrial contractions. An EKG performed in our office today also shows PACs, for which she is not completely aware of and it is unclear whether this is new or old.  He denies any specific angina.  [name] underwent an echocardiogram and nuclear stress test in our office at the end of October and early November. The echocardiogram demonstrated normal systolic function with mild aortic insufficiency and mild mitral insufficiency. There was borderline right atrial enlargement.  He was reportedly very nervous about a stress test and at the onset was noted to be in a rapid heart rate of 150 which was regular and there were clear flutter waves. This terminated spontaneously. Perfusion imaging did not demonstrate any defects.  [name] stopped his lisinopril due to what he felt was low blood pressures. He brought in a list of his blood pressures  today and 8 do seem to be pretty well controlled at home. He reports some of his symptoms have improved. He denies any chest pain or significant recurrent palpitations.  I saw [name] back today in the office. He reports no complaints. He denies any chest pain, shortness of breath or worsening fatigue. Blood pressure was mildly elevated today 130/92. EKG shows atrial flutter with variable ventricular response at 119. He is completely unaware of this. He does have a history of paroxysmal atrial flutter in the past but it seems to be remotely. He is now 66 inches CHADSVASC score is 2. He is currently on aspirin only.  [name] for follow-up of atrial fibrillation. Unfortunately his cardioversion was not successful. We did have Weaver undergo nuclear stress testing which showed no evidence of ischemia although the study was gated while he was in A. fib and his EF was reduced. I suspect this is a gating abnormality. We confirmed normal LV function by echo, however did show mild AI and MR. These findings are considered insignificant when contemplating using a 1C antiarrhythmic. We discussed options and I feel that our best chance of success in achieving sinus rhythm would be with antiarrhythmic therapy. I would recommend he start on flecainide 50 mg twice a day. He should continue Eliquis. He will need an EKG in 1 week after starting flecainide.  10/16/2015  [name] for follow-up. He underwent cardioversion for atrial fibrillation. He says that afterwards he felt significantly better and continues to feel more energized. However today I told Weaver that he is back  in atrial flutter. This tells me that he may not be truly symptomatic with A. Fib/flutter. Obviously the flecainide was not sufficient enough to hold Weaver and rhythm. We discussed options today including continuing to pursue rhythm control or perhaps rate control if he is truly asymptomatic. We could also consider referral for  ablation, although he since he's had A. fib and atrial flutter, while he may be able to be treated with regards to flutter, he may continue to have atrial fibrillation.  12/25/2015  [name] was seen today in follow-up. I maintain Weaver on an increased dose of metoprolol which seems to have controlled his ventricular response. His heart rate is irregular today probably indicating persistent atrial fibrillation. He is on Eliquis without any bleeding problems. He continues to be asymptomatic therefore we will continue a rate control strategy. His recent sleep study came back abnormal suggestive of sleep apnea. He says this is probably related to upper airway problems as he has a history of nasal fracture and college which was significant and he has a deviated nasal septum. This is never been evaluated by an ENT doctor.  07/01/2016  [name] was seen today in follow-up. Recently I cleared Weaver for ENT surgery however he is not yet undergone that. Blood pressure was elevated today 142/96 have her recheck was 138/80. He is overdue for a lipid profile and metabolic profile. He is in atrial fibrillation today this may be persistent and we have not pursued any further attempts for achieving sinus rhythm since he is completely asymptomatic. Heart rate is well-controlled. He is tolerating Eliquis without any bleeding problems.  12/30/2016  [name] for follow-up. He is doing really well. He just underwent nasal septoplasty. He says he is doing extremely well since then with marked improvement in his breathing and better oxygenation. His wife reports that he does not snore at night anymore. He remains in A. fib which is long-standing persistent at this point but he is asymptomatic with it. He is on Eliquis for CHADSVASC score 2. Blood pressure was well-controlled today. His cholesterol was at goal. He denies any chest pain or worsening shortness of breath.  01/27/2018  [name] for  follow-up.  He is concerned about worsening dyspnea and fatigue over the past year.  He says he has become more short of breath and gained about 40 pounds, which he attributes to taking beta-blocker and/or the diltiazem since they are listed side effects.  He also reports more fatigue.  He wonders if this could be related to his A. fib.  In the past he is not noted any symptoms related to A. fib, which makes me think it may be his weight gain or other symptoms such as heart disease or heart failure that could be causing his symptoms.  Nevertheless he wants to try to come off the medications and inquired about other attempts to get Weaver back into rhythm.  He had twice failed cardioversions in the past and therefore we had pursued rate control and anticoagulation for the past 4 years.  04/29/2018  [name] for follow-up of his studies.  He underwent an echo and stress test for progressive dyspnea and fatigue.  This was performed in late August.  His stress test was low risk and demonstrated no ischemia.  His echo showed a low normal EF of 50 to 55% with moderate aortic insufficiency.  Is not clear whether this is contributing to his shortness of breath although  he says his symptoms have improved.  He did recently lose about 10 pounds.  This was due to being sick and he had some type of laryngeal pharyngitis which made it difficult for Weaver to eat.  But pressure remains above goal.  With regards to A. fib he did see Dr. Rayann Heman recently.  He did not feel that [name] was a candidate for ablation rather he felt that we could consider dofetilide if he was really symptomatic.  At this point Steve Weaver is not quite sure if his symptoms are related to A. fib or not and would prefer to avoid additional medication.  His A. fib is rate controlled.  PMHx:  Past Medical History:  Diagnosis Date  . Hyperlipidemia   . Hypertension   . Longstanding persistent atrial fibrillation     Past Surgical History:    Procedure Laterality Date  . BROKE LEG  11/1998  . CARDIOVERSION N/A 06/08/2015   Procedure: CARDIOVERSION;  Surgeon: Pixie Casino, MD;  Location[facility] ENDOSCOPY;  Service: Cardiovascular;  Laterality: N/A;  . CARDIOVERSION N/A 09/11/2015   Procedure: CARDIOVERSION;  Surgeon: Pixie Casino, MD;  Location: [facility] ENDOSCOPY;  Service: Cardiovascular;  Laterality: N/A;  . NASAL SEPTOPLASTY W/ TURBINOPLASTY Bilateral 12/16/2016   Procedure: NASAL SEPTOPLASTY WITH BILATERAL TURBINATE REDUCTION;  Surgeon: Leta Baptist, MD;  Location: [place];  Service: ENT;  Laterality: Bilateral;  . NM MYOCAR PERF WALL MOTION  05/04/2013   bruce myoview - normal stress nuclear study; a-flutter & a-fib with RVR, EF 59%    FAMHx:  Family History  Problem Relation Age of Onset  . Hyperlipidemia Mother   . Hypertension Mother   . Emphysema Mother   . Diabetes Father   . Dementia Father   . Anemia Neg Hx   . Arrhythmia Neg Hx   . Asthma Neg Hx   . Clotting disorder Neg Hx   . Fainting Neg Hx   . Heart attack Neg Hx   . Heart disease Neg Hx   . Heart failure Neg Hx     SOCHx:   reports that he has quit smoking. His smoking use included cigars. He has never used smokeless tobacco. He reports that he drinks about 6.0 standard drinks of alcohol per week. He reports that he does not use drugs.  ALLERGIES:  Allergies  Allergen Reactions  . Other Rash    Muscle Relaxer (unsure of name)    ROS: Pertinent items noted in HPI and remainder of comprehensive ROS otherwise negative.  HOME MEDS: Current Outpatient Medications  Medication Sig Dispense Refill  . apixaban (ELIQUIS) 5 MG TABS tablet Take 1 tablet (5 mg total) by mouth 2 (two) times daily. 180 tablet 1  . Coenzyme Q10 (CO Q 10 PO) Take 1 capsule by mouth daily.    [name] diltiazem (CARDIZEM CD) 120 MG 24 hr capsule TAKE 1 CAPSULE BY MOUTH EVERY DAY 90 capsule 1  . lovastatin (MEVACOR) 40 MG tablet TAKE 1 TABLET (40 MG TOTAL) BY MOUTH DAILY.  90 tablet 1  . metoprolol tartrate (LOPRESSOR) 25 MG tablet TAKE 1.5 TABLETS (37.5 MG TOTAL) BY MOUTH 2 (TWO) TIMES DAILY. 270 tablet 1  . Multiple Vitamin (MULTIVITAMIN) capsule Take 1 capsule by mouth daily.    [name] omega-3 fish oil (MAXEPA) 1000 MG CAPS capsule Take 2 capsules by mouth daily.     No current facility-administered medications for this visit.     LABS/IMAGING: No results found for this or any  previous visit (from the past 48 hour(s)). No results found.  VITALS: BP (!) 156/86   Pulse 79   Ht 5\' 8"  (1.727 m)   Wt 194 lb 9.6 oz (88.3 kg)   BMI 29.59 kg/m   EXAM: Deferred  EKG: A. fib with controlled ventricular response at 79-personally reviewed  ASSESSMENT: 1. Persistent atrial fibrillation / flutter, with rapid ventricular response - CHADS2VASC score of 2 2. Hypertension 3. Dyslipidemia 4. OSA-snoring improved after septal surgery 5. History of nasal septal deviation - s/p septoplasty (12/2016) 6. Dyspnea on exertion 7. Moderate aortic insufficiency  PLAN: 1.   [name] had low risk stress testing and was shown to have moderate AI on echo with low normal LVEF.  He may have elevated left atrial pressure.  I recommend we start a low-dose long-acting thiazide diuretic to improve his symptoms and help with additional blood pressure.  Hopefully this will improve his shortness of breath.  Plan follow-up with me in 3 to 6 months.  His A. fib is rate controlled.  Pixie Casino, MD, [facility], [place] Director of the Advanced Lipid Disorders &  Cardiovascular Risk Reduction Clinic Diplomate of the American Board of Clinical Lipidology Attending Cardiologist  Direct Dial: [phone]  Fax: [phone]  Website:  [email or web address] 04/29/2018, 9:18 AM

## 2018-04-29 NOTE — Patient Instructions (Addendum)
Medication Instructions:  START chlorthalidone 12.5mg  daily If you need a refill on your cardiac medications before your next appointment, please call your pharmacy.   Lab work: BMET & Magnesium in 1 week - you DO NOT have to be fasting If you have labs (blood work) drawn today and your tests are completely normal, you will receive your results only by: [name] MyChart Message (if you have MyChart) OR . A paper copy in the mail If you have any lab test that is abnormal or we need to change your treatment, we will call you to review the results.  Testing/Procedures: NONE  Follow-Up: At [facility], you and your health needs are our priority.  As part of our continuing mission to provide you with exceptional heart care, we have created designated Provider Care Teams.  These Care Teams include your primary Cardiologist (physician) and Advanced Practice Providers (APPs -  Physician Assistants and Nurse Practitioners) who all work together to provide you with the care you need, when you need it. .  You will need a follow up appointment in 3 months with Dr. Debara Pickett or one of the following Advanced Practice Providers on your designated Care Team: [place], [place] . Fabian Sharp, PA-C  Any Other Special Instructions Will Be Listed Below (If Applicable).

## 2018-05-27 DIAGNOSIS — Z79899 Other long term (current) drug therapy: Secondary | ICD-10-CM | POA: Diagnosis not present

## 2018-05-28 LAB — BASIC METABOLIC PANEL
BUN/Creatinine Ratio: 14 (ref 10–24)
BUN: 17 mg/dL (ref 8–27)
CO2: 27 mmol/L (ref 20–29)
Calcium: 9.9 mg/dL (ref 8.6–10.2)
Chloride: 101 mmol/L (ref 96–106)
Creatinine, Ser: 1.19 mg/dL (ref 0.76–1.27)
GFR calc Af Amer: 72 mL/min/{1.73_m2} (ref >59)
GFR calc non Af Amer: 62 mL/min/{1.73_m2} (ref >59)
Glucose: 89 mg/dL (ref 65–99)
Potassium: 4 mmol/L (ref 3.5–5.2)
Sodium: 141 mmol/L (ref 134–144)

## 2018-05-28 LAB — MAGNESIUM: Magnesium: 2.2 mg/dL (ref 1.6–2.3)

## 2018-06-12 ENCOUNTER — Encounter: Payer: Self-pay | Admitting: Internal Medicine

## 2018-08-04 ENCOUNTER — Ambulatory Visit (INDEPENDENT_AMBULATORY_CARE_PROVIDER_SITE_OTHER): Payer: PPO | Admitting: Internal Medicine

## 2018-08-04 ENCOUNTER — Encounter: Payer: Self-pay | Admitting: Internal Medicine

## 2018-08-04 VITALS — BP 131/81 | HR 78 | Ht 68.0 in | Wt 199.2 lb

## 2018-08-04 DIAGNOSIS — I10 Essential (primary) hypertension: Secondary | ICD-10-CM | POA: Diagnosis not present

## 2018-08-04 DIAGNOSIS — R0602 Shortness of breath: Secondary | ICD-10-CM | POA: Diagnosis not present

## 2018-08-04 DIAGNOSIS — I4819 Other persistent atrial fibrillation: Secondary | ICD-10-CM | POA: Diagnosis not present

## 2018-08-04 NOTE — Progress Notes (Signed)
OFFICE NOTE  Chief Complaint:  Follow-up blood pressure  Primary Care Physician: Tisovec, Fransico Him, MD  HPI:  Steve Weaver is a pleasant 68 year old male who has been experiencing some increasing fatigue with exertion and decreased exercise tolerance over the past several years. 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 He has since struggled, working a number of jobs in order to make ends meet.  He used to be very physically active however recently has stopped exercising and has had about 20 pounds of weight gain. He found that he's been more fatigued during certain activities for example planting some bushes in the yard caused him more shortness of breath and fatigue that he was typically use to. He was recently seen for a well check by his primary care provider, including followup on his hypertension and dyslipidemia. He was noted to have an irregular heartbeat an EKG performed apparently showed premature ventricular contractions. I subsequently received the EKG which shows premature atrial contractions. An EKG performed in our office today also shows PACs, for which she is not completely aware of and it is unclear whether this is new or old.  He denies any specific angina.  Steve Weaver underwent an echocardiogram and nuclear stress test in our office at the end of October and early November. The echocardiogram demonstrated normal systolic function with mild aortic insufficiency and mild mitral insufficiency. There was borderline right atrial enlargement.  He was reportedly very nervous about a stress test and at the onset was noted to be in a rapid heart rate of 150 which was regular and there were clear flutter waves. This terminated spontaneously. Perfusion imaging did not demonstrate any defects.  Steve Weaver stopped his lisinopril due to what he felt was low blood pressures. He brought in a list of his blood  pressures today and 8 do seem to be pretty well controlled at home. He reports some of his symptoms have improved. He denies any chest pain or significant recurrent palpitations.  I saw Steve Weaver back today in the office. He reports no complaints. He denies any chest pain, shortness of breath or worsening fatigue. Blood pressure was mildly elevated today 130/92. EKG shows atrial flutter with variable ventricular response at 119. He is completely unaware of this. He does have a history of paroxysmal atrial flutter in the past but it seems to be remotely. He is now 66 inches CHADSVASC score is 2. He is currently on aspirin only.  Steve Weaver returns today for follow-up of atrial fibrillation. Unfortunately his cardioversion was not successful. We did have him undergo nuclear stress testing which showed no evidence of ischemia although the study was gated while he was in A. fib and his EF was reduced. I suspect this is a gating abnormality. We confirmed normal LV function by echo, however did show mild AI and MR. These findings are considered insignificant when contemplating using a 1C antiarrhythmic. We discussed options and I feel that our best chance of success in achieving sinus rhythm would be with antiarrhythmic therapy. I would recommend he start on flecainide 50 mg twice a day. He should continue Eliquis. He will need an EKG in 1 week after starting flecainide.  10/16/2015  Steve Weaver returns today for follow-up. He underwent cardioversion for atrial fibrillation. He says that afterwards he felt significantly better and continues to feel more energized. However today I told him that he is  back in atrial flutter. This tells me that he may not be truly symptomatic with A. Fib/flutter. Obviously the flecainide was not sufficient enough to hold him and rhythm. We discussed options today including continuing to pursue rhythm control or perhaps rate control if he is truly asymptomatic. We could also consider  referral for ablation, although he since he's had A. fib and atrial flutter, while he may be able to be treated with regards to flutter, he may continue to have atrial fibrillation.  12/25/2015  Steve Weaver was seen today in follow-up. I maintain him on an increased dose of metoprolol which seems to have controlled his ventricular response. His heart rate is irregular today probably indicating persistent atrial fibrillation. He is on Eliquis without any bleeding problems. He continues to be asymptomatic therefore we will continue a rate control strategy. His recent sleep study came back abnormal suggestive of sleep apnea. He says this is probably related to upper airway problems as he has a history of nasal fracture and college which was significant and he has a deviated nasal septum. This is never been evaluated by an ENT doctor.  07/01/2016  Steve Weaver was seen today in follow-up. Recently I cleared him for ENT surgery however he is not yet undergone that. Blood pressure was elevated today 142/96 have her recheck was 138/80. He is overdue for a lipid profile and metabolic profile. He is in atrial fibrillation today this may be persistent and we have not pursued any further attempts for achieving sinus rhythm since he is completely asymptomatic. Heart rate is well-controlled. He is tolerating Eliquis without any bleeding problems.  12/30/2016  Steve Weaver returns today for follow-up. He is doing really well. He just underwent nasal septoplasty. He says he is doing extremely well since then with marked improvement in his breathing and better oxygenation. His wife reports that he does not snore at night anymore. He remains in A. fib which is long-standing persistent at this point but he is asymptomatic with it. He is on Eliquis for CHADSVASC score 2. Blood pressure was well-controlled today. His cholesterol was at goal. He denies any chest pain or worsening shortness of breath.  01/27/2018  Steve Weaver returns  today for follow-up.  He is concerned about worsening dyspnea and fatigue over the past year.  He says he has become more short of breath and gained about 40 pounds, which he attributes to taking beta-blocker and/or the diltiazem since they are listed side effects.  He also reports more fatigue.  He wonders if this could be related to his A. fib.  In the past he is not noted any symptoms related to A. fib, which makes me think it may be his weight gain or other symptoms such as heart disease or heart failure that could be causing his symptoms.  Nevertheless he wants to try to come off the medications and inquired about other attempts to get him back into rhythm.  He had twice failed cardioversions in the past and therefore we had pursued rate control and anticoagulation for the past 4 years.  04/29/2018  Steve Weaver returns today for follow-up of his studies.  He underwent an echo and stress test for progressive dyspnea and fatigue.  This was performed in late August.  His stress test was low risk and demonstrated no ischemia.  His echo showed a low normal EF of 50 to 55% with moderate aortic insufficiency.  Is not clear whether this is contributing to his shortness of breath  although he says his symptoms have improved.  He did recently lose about 10 pounds.  This was due to being sick and he had some type of laryngeal pharyngitis which made it difficult for him to eat.  But pressure remains above goal.  With regards to A. fib he did see Dr. Rayann Heman recently.  He did not feel that Steve Weaver was a candidate for ablation rather he felt that we could consider dofetilide if he was really symptomatic.  At this point Steve Weaver is not quite sure if his symptoms are related to A. fib or not and would prefer to avoid additional medication.  His A. fib is rate controlled.  08/04/2018  Steve Weaver seen today in follow-up.  He has been placed on chlorthalidone.  He is blood pressure is now much better per improved.  Is now  between 604 540 systolic over the low 98J diastolic.  He initially had some cramping on the medication however blood work shows normal potassium normal magnesium.  PMHx:  Past Medical History:  Diagnosis Date  . Hyperlipidemia   . Hypertension   . Longstanding persistent atrial fibrillation     Past Surgical History:  Procedure Laterality Date  . BROKE LEG  11/1998  . CARDIOVERSION N/A 06/08/2015   Procedure: CARDIOVERSION;  Surgeon: Pixie Casino, MD;  Location: [facility] ENDOSCOPY;  Service: Cardiovascular;  Laterality: N/A;  . CARDIOVERSION N/A 09/11/2015   Procedure: CARDIOVERSION;  Surgeon: Pixie Casino, MD;  Location[facility] ENDOSCOPY;  Service: Cardiovascular;  Laterality: N/A;  . NASAL SEPTOPLASTY W/ TURBINOPLASTY Bilateral 12/16/2016   Procedure: NASAL SEPTOPLASTY WITH BILATERAL TURBINATE REDUCTION;  Surgeon: Leta Baptist, MD;  Location: [place];  Service: ENT;  Laterality: Bilateral;  . NM MYOCAR PERF WALL MOTION  05/04/2013   bruce myoview - normal stress nuclear study; a-flutter & a-fib with RVR, EF 59%    FAMHx:  Family History  Problem Relation Age of Onset  . Hyperlipidemia Mother   . Hypertension Mother   . Emphysema Mother   . Diabetes Father   . Dementia Father   . Anemia Neg Hx   . Arrhythmia Neg Hx   . Asthma Neg Hx   . Clotting disorder Neg Hx   . Fainting Neg Hx   . Heart attack Neg Hx   . Heart disease Neg Hx   . Heart failure Neg Hx     SOCHx:   reports that he has quit smoking. His smoking use included cigars. He has never used smokeless tobacco. He reports current alcohol use of about 6.0 standard drinks of alcohol per week. He reports that he does not use drugs.  ALLERGIES:  Allergies  Allergen Reactions  . Other Rash    Muscle Relaxer (unsure of name)    ROS: Pertinent items noted in HPI and remainder of comprehensive ROS otherwise negative.  HOME MEDS: Current Outpatient Medications  Medication Sig Dispense Refill  . apixaban  (ELIQUIS) 5 MG TABS tablet Take 1 tablet (5 mg total) by mouth 2 (two) times daily. 180 tablet 1  . chlorthalidone (HYGROTON) 25 MG tablet Take 0.5 tablets (12.5 mg total) by mouth daily. 90 tablet 3  . Coenzyme Q10 (CO Q 10 PO) Take 1 capsule by mouth daily.    [name] diltiazem (CARDIZEM CD) 120 MG 24 hr capsule TAKE 1 CAPSULE BY MOUTH EVERY DAY 90 capsule 1  . lovastatin (MEVACOR) 40 MG tablet TAKE 1 TABLET (40 MG TOTAL) BY MOUTH DAILY. 90 tablet 1  .  metoprolol tartrate (LOPRESSOR) 25 MG tablet TAKE 1.5 TABLETS (37.5 MG TOTAL) BY MOUTH 2 (TWO) TIMES DAILY. 270 tablet 1  . Multiple Vitamin (MULTIVITAMIN) capsule Take 1 capsule by mouth daily.    [name] omega-3 fish oil (MAXEPA) 1000 MG CAPS capsule Take 2 capsules by mouth daily.     No current facility-administered medications for this visit.     LABS/IMAGING: No results found for this or any previous visit (from the past 48 hour(s)). No results found.  VITALS: BP 131/81   Pulse 78   Ht 5\' 8"  (1.727 m)   Wt 199 lb 3.2 oz (90.4 kg)   SpO2 96%   BMI 30.29 kg/m   EXAM: Deferred  EKG: Deferred  ASSESSMENT: 1. Persistent atrial fibrillation / flutter, with rapid ventricular response - CHADS2VASC score of 2 2. Hypertension 3. Dyslipidemia 4. OSA-snoring improved after septal surgery 5. History of nasal septal deviation - s/p septoplasty (12/2016) 6. Dyspnea on exertion 7. Moderate aortic insufficiency  PLAN: 1.   Steve Weaver has had improvement in blood pressure on current medication.  Seems to be well-tolerated.  Overall mild improvement of breathing.  Labs stable with normal magnesium potassium.  We will plan to continue current medication.  Plan follow-up with me in 6 months sooner as necessary.  Pixie Casino, MD, [facility], [place] Director of the Advanced Lipid Disorders &  Cardiovascular Risk Reduction Clinic Diplomate of the American Board of Clinical Lipidology Attending Cardiologist  Direct  Dial: [phone]  Fax: [phone]  Website:  [email or web address] 08/04/2018, 9:33 AM

## 2018-08-04 NOTE — Patient Instructions (Signed)
Medication Instructions:  Continue same medications If you need a refill on your cardiac medications before your next appointment, please call your pharmacy.   Lab work: None ordered   Testing/Procedures: None ordered  Follow-Up: At [facility], you and your health needs are our priority.  As part of our continuing mission to provide you with exceptional heart care, we have created designated Provider Care Teams.  These Care Teams include your primary Cardiologist (physician) and Advanced Practice Providers (APPs -  Physician Assistants and Nurse Practitioners) who all work together to provide you with the care you need, when you need it. . Schedule follow up appointment with Dr.Hilty in 6 months  Call 3 months before to schedule

## 2018-09-07 ENCOUNTER — Telehealth: Payer: Self-pay | Admitting: Internal Medicine

## 2018-09-07 MED ORDER — APIXABAN 5 MG PO TABS: 5.0000 mg | ORAL_TABLET | Freq: Two times a day (BID) | ORAL | 1 refills | Status: DC

## 2018-09-07 NOTE — Telephone Encounter (Signed)
Pt calling requesting a refill on Eliquis sent to [facility]. Please address

## 2018-09-11 ENCOUNTER — Other Ambulatory Visit: Payer: Self-pay | Admitting: Internal Medicine

## 2018-09-11 DIAGNOSIS — I4892 Unspecified atrial flutter: Secondary | ICD-10-CM

## 2018-09-11 MED ORDER — DILTIAZEM HCL ER COATED BEADS 120 MG PO CP24: ORAL_CAPSULE | ORAL | 1 refills | Status: DC

## 2018-09-11 MED ORDER — LOVASTATIN 40 MG PO TABS: ORAL_TABLET | ORAL | 1 refills | Status: DC

## 2018-09-11 MED ORDER — METOPROLOL TARTRATE 25 MG PO TABS: 37.5000 mg | ORAL_TABLET | Freq: Two times a day (BID) | ORAL | 1 refills | Status: DC

## 2018-09-11 NOTE — Telephone Encounter (Signed)
Lovastatin, lopressor and diltiazem refilled.

## 2018-09-11 NOTE — Telephone Encounter (Signed)
° ° ° °*  STAT* If patient is at the pharmacy, call can be transferred to refill team.   1. Which medications need to be refilled? (please list name of each medication and dose if known) diltiazem (CARDIZEM CD) 120 MG 24 hr capsule, lovastatin (MEVACOR) 40 MG tablet, metoprolol tartrate (LOPRESSOR) 25 MG tablet and chlorthalidone (HYGROTON) 25 MG tablet  2. Which pharmacy/location (including street and city if local pharmacy) is medication to be sent to?[id number] - [place], [place] - [address]  3. Do they need a 30 day or 90 day supply? [place]

## 2018-11-20 DIAGNOSIS — Z20828 Contact with and (suspected) exposure to other viral communicable diseases: Secondary | ICD-10-CM | POA: Diagnosis not present

## 2018-11-25 DIAGNOSIS — I480 Paroxysmal atrial fibrillation: Secondary | ICD-10-CM | POA: Diagnosis not present

## 2018-11-25 DIAGNOSIS — I10 Essential (primary) hypertension: Secondary | ICD-10-CM | POA: Diagnosis not present

## 2018-11-25 DIAGNOSIS — M25552 Pain in left hip: Secondary | ICD-10-CM | POA: Diagnosis not present

## 2018-11-25 DIAGNOSIS — Z7901 Long term (current) use of anticoagulants: Secondary | ICD-10-CM | POA: Diagnosis not present

## 2018-12-01 DIAGNOSIS — M5432 Sciatica, left side: Secondary | ICD-10-CM | POA: Diagnosis not present

## 2018-12-01 DIAGNOSIS — M545 Low back pain: Secondary | ICD-10-CM | POA: Diagnosis not present

## 2018-12-01 DIAGNOSIS — S3991XA Unspecified injury of abdomen, initial encounter: Secondary | ICD-10-CM | POA: Diagnosis not present

## 2018-12-10 DIAGNOSIS — R102 Pelvic and perineal pain: Secondary | ICD-10-CM | POA: Diagnosis not present

## 2018-12-18 DIAGNOSIS — R102 Pelvic and perineal pain: Secondary | ICD-10-CM | POA: Diagnosis not present

## 2018-12-24 DIAGNOSIS — R102 Pelvic and perineal pain: Secondary | ICD-10-CM | POA: Diagnosis not present

## 2018-12-31 DIAGNOSIS — R102 Pelvic and perineal pain: Secondary | ICD-10-CM | POA: Diagnosis not present

## 2019-01-07 DIAGNOSIS — R102 Pelvic and perineal pain: Secondary | ICD-10-CM | POA: Diagnosis not present

## 2019-02-20 ENCOUNTER — Other Ambulatory Visit: Payer: Self-pay | Admitting: Internal Medicine

## 2019-03-03 ENCOUNTER — Other Ambulatory Visit: Payer: Self-pay | Admitting: Internal Medicine

## 2019-03-03 DIAGNOSIS — I4892 Unspecified atrial flutter: Secondary | ICD-10-CM

## 2019-03-04 DIAGNOSIS — E7800 Pure hypercholesterolemia, unspecified: Secondary | ICD-10-CM | POA: Diagnosis not present

## 2019-03-08 DIAGNOSIS — R82998 Other abnormal findings in urine: Secondary | ICD-10-CM | POA: Diagnosis not present

## 2019-03-08 DIAGNOSIS — N183 Chronic kidney disease, stage 3 (moderate): Secondary | ICD-10-CM | POA: Diagnosis not present

## 2019-03-10 DIAGNOSIS — Z1212 Encounter for screening for malignant neoplasm of rectum: Secondary | ICD-10-CM | POA: Diagnosis not present

## 2019-03-11 DIAGNOSIS — Z0000 Encounter for general adult medical examination without abnormal findings: Secondary | ICD-10-CM | POA: Diagnosis not present

## 2019-03-11 DIAGNOSIS — Z1339 Encounter for screening examination for other mental health and behavioral disorders: Secondary | ICD-10-CM | POA: Diagnosis not present

## 2019-03-11 DIAGNOSIS — Z1331 Encounter for screening for depression: Secondary | ICD-10-CM | POA: Diagnosis not present

## 2019-03-11 DIAGNOSIS — G4733 Obstructive sleep apnea (adult) (pediatric): Secondary | ICD-10-CM | POA: Diagnosis not present

## 2019-03-11 DIAGNOSIS — D6869 Other thrombophilia: Secondary | ICD-10-CM | POA: Diagnosis not present

## 2019-03-11 DIAGNOSIS — N183 Chronic kidney disease, stage 3 unspecified: Secondary | ICD-10-CM | POA: Diagnosis not present

## 2019-03-11 DIAGNOSIS — Z7901 Long term (current) use of anticoagulants: Secondary | ICD-10-CM | POA: Diagnosis not present

## 2019-03-11 DIAGNOSIS — E7800 Pure hypercholesterolemia, unspecified: Secondary | ICD-10-CM | POA: Diagnosis not present

## 2019-03-11 DIAGNOSIS — I129 Hypertensive chronic kidney disease with stage 1 through stage 4 chronic kidney disease, or unspecified chronic kidney disease: Secondary | ICD-10-CM | POA: Diagnosis not present

## 2019-03-11 DIAGNOSIS — M5136 Other intervertebral disc degeneration, lumbar region: Secondary | ICD-10-CM | POA: Diagnosis not present

## 2019-03-11 DIAGNOSIS — D126 Benign neoplasm of colon, unspecified: Secondary | ICD-10-CM | POA: Diagnosis not present

## 2019-03-11 DIAGNOSIS — D692 Other nonthrombocytopenic purpura: Secondary | ICD-10-CM | POA: Diagnosis not present

## 2019-03-11 DIAGNOSIS — I480 Paroxysmal atrial fibrillation: Secondary | ICD-10-CM | POA: Diagnosis not present

## 2019-03-11 DIAGNOSIS — M25552 Pain in left hip: Secondary | ICD-10-CM | POA: Diagnosis not present

## 2019-03-12 DIAGNOSIS — Z125 Encounter for screening for malignant neoplasm of prostate: Secondary | ICD-10-CM | POA: Diagnosis not present

## 2019-03-25 ENCOUNTER — Other Ambulatory Visit: Payer: Self-pay

## 2019-03-25 ENCOUNTER — Ambulatory Visit (INDEPENDENT_AMBULATORY_CARE_PROVIDER_SITE_OTHER): Payer: PPO | Admitting: Internal Medicine

## 2019-03-25 ENCOUNTER — Encounter: Payer: Self-pay | Admitting: Internal Medicine

## 2019-03-25 VITALS — BP 142/91 | HR 84 | Temp 97.3°F | Ht 68.0 in | Wt 189.4 lb

## 2019-03-25 DIAGNOSIS — I10 Essential (primary) hypertension: Secondary | ICD-10-CM | POA: Diagnosis not present

## 2019-03-25 DIAGNOSIS — Z8679 Personal history of other diseases of the circulatory system: Secondary | ICD-10-CM

## 2019-03-25 DIAGNOSIS — E785 Hyperlipidemia, unspecified: Secondary | ICD-10-CM | POA: Diagnosis not present

## 2019-03-25 DIAGNOSIS — Z23 Encounter for immunization: Secondary | ICD-10-CM | POA: Diagnosis not present

## 2019-03-25 DIAGNOSIS — I4819 Other persistent atrial fibrillation: Secondary | ICD-10-CM

## 2019-03-25 NOTE — Progress Notes (Signed)
OFFICE NOTE  Chief Complaint:  Routine follow-up  Primary Care Physician: Tisovec, Fransico Him, MD  HPI:  Steve Weaver is a pleasant 68 year old male who has been experiencing some increasing fatigue with exertion and decreased exercise tolerance over the past several years. 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 He has since struggled, working a number of jobs in order to make ends meet.  He used to be very physically active however recently has stopped exercising and has had about 20 pounds of weight gain. He found that he's been more fatigued during certain activities for example planting some bushes in the yard caused him more shortness of breath and fatigue that he was typically use to. He was recently seen for a well check by his primary care provider, including followup on his hypertension and dyslipidemia. He was noted to have an irregular heartbeat an EKG performed apparently showed premature ventricular contractions. I subsequently received the EKG which shows premature atrial contractions. An EKG performed in our office today also shows PACs, for which she is not completely aware of and it is unclear whether this is new or old.  He denies any specific angina.  [name] underwent an echocardiogram and nuclear stress test in our office at the end of October and early November. The echocardiogram demonstrated normal systolic function with mild aortic insufficiency and mild mitral insufficiency. There was borderline right atrial enlargement.  He was reportedly very nervous about a stress test and at the onset was noted to be in a rapid heart rate of 150 which was regular and there were clear flutter waves. This terminated spontaneously. Perfusion imaging did not demonstrate any defects.  [name] stopped his lisinopril due to what he felt was low blood pressures. He brought in a list of his blood pressures today  and 8 do seem to be pretty well controlled at home. He reports some of his symptoms have improved. He denies any chest pain or significant recurrent palpitations.  I saw [name] back today in the office. He reports no complaints. He denies any chest pain, shortness of breath or worsening fatigue. Blood pressure was mildly elevated today 130/92. EKG shows atrial flutter with variable ventricular response at 119. He is completely unaware of this. He does have a history of paroxysmal atrial flutter in the past but it seems to be remotely. He is now 66 inches CHADSVASC score is 2. He is currently on aspirin only.  [name] returns today for follow-up of atrial fibrillation. Unfortunately his cardioversion was not successful. We did have him undergo nuclear stress testing which showed no evidence of ischemia although the study was gated while he was in A. fib and his EF was reduced. I suspect this is a gating abnormality. We confirmed normal LV function by echo, however did show mild AI and MR. These findings are considered insignificant when contemplating using a 1C antiarrhythmic. We discussed options and I feel that our best chance of success in achieving sinus rhythm would be with antiarrhythmic therapy. I would recommend he start on flecainide 50 mg twice a day. He should continue Eliquis. He will need an EKG in 1 week after starting flecainide.  10/16/2015  [name] returns today for follow-up. He underwent cardioversion for atrial fibrillation. He says that afterwards he felt significantly better and continues to feel more energized. However today I told him that he is back in atrial  flutter. This tells me that he may not be truly symptomatic with A. Fib/flutter. Obviously the flecainide was not sufficient enough to hold him and rhythm. We discussed options today including continuing to pursue rhythm control or perhaps rate control if he is truly asymptomatic. We could also consider referral for  ablation, although he since he's had A. fib and atrial flutter, while he may be able to be treated with regards to flutter, he may continue to have atrial fibrillation.  12/25/2015  [name] was seen today in follow-up. I maintain him on an increased dose of metoprolol which seems to have controlled his ventricular response. His heart rate is irregular today probably indicating persistent atrial fibrillation. He is on Eliquis without any bleeding problems. He continues to be asymptomatic therefore we will continue a rate control strategy. His recent sleep study came back abnormal suggestive of sleep apnea. He says this is probably related to upper airway problems as he has a history of nasal fracture and college which was significant and he has a deviated nasal septum. This is never been evaluated by an ENT doctor.  07/01/2016  [name] was seen today in follow-up. Recently I cleared him for ENT surgery however he is not yet undergone that. Blood pressure was elevated today 142/96 have her recheck was 138/80. He is overdue for a lipid profile and metabolic profile. He is in atrial fibrillation today this may be persistent and we have not pursued any further attempts for achieving sinus rhythm since he is completely asymptomatic. Heart rate is well-controlled. He is tolerating Eliquis without any bleeding problems.  12/30/2016  [name] returns today for follow-up. He is doing really well. He just underwent nasal septoplasty. He says he is doing extremely well since then with marked improvement in his breathing and better oxygenation. His wife reports that he does not snore at night anymore. He remains in A. fib which is long-standing persistent at this point but he is asymptomatic with it. He is on Eliquis for CHADSVASC score 2. Blood pressure was well-controlled today. His cholesterol was at goal. He denies any chest pain or worsening shortness of breath.  01/27/2018  [name] returns today for  follow-up.  He is concerned about worsening dyspnea and fatigue over the past year.  He says he has become more short of breath and gained about 40 pounds, which he attributes to taking beta-blocker and/or the diltiazem since they are listed side effects.  He also reports more fatigue.  He wonders if this could be related to his A. fib.  In the past he is not noted any symptoms related to A. fib, which makes me think it may be his weight gain or other symptoms such as heart disease or heart failure that could be causing his symptoms.  Nevertheless he wants to try to come off the medications and inquired about other attempts to get him back into rhythm.  He had twice failed cardioversions in the past and therefore we had pursued rate control and anticoagulation for the past 4 years.  04/29/2018  [name] returns today for follow-up of his studies.  He underwent an echo and stress test for progressive dyspnea and fatigue.  This was performed in late August.  His stress test was low risk and demonstrated no ischemia.  His echo showed a low normal EF of 50 to 55% with moderate aortic insufficiency.  Is not clear whether this is contributing to his shortness of breath although he says  his symptoms have improved.  He did recently lose about 10 pounds.  This was due to being sick and he had some type of laryngeal pharyngitis which made it difficult for him to eat.  But pressure remains above goal.  With regards to A. fib he did see Dr. Rayann Heman recently.  He did not feel that [name] was a candidate for ablation rather he felt that we could consider dofetilide if he was really symptomatic.  At this point Isabell Jarvis is not quite sure if his symptoms are related to A. fib or not and would prefer to avoid additional medication.  His A. fib is rate controlled.  08/04/2018  [name] seen today in follow-up.  He has been placed on chlorthalidone.  He is blood pressure is now much better per improved.  Is now between [id number]  systolic over the low [id number] diastolic.  He initially had some cramping on the medication however blood work shows normal potassium normal magnesium.  03/24/2021  [name] is seen today in follow-up.  He continues to do well.  He denies any chest pain or worsening shortness of breath.  Blood pressure at home is generally been well controlled.  His echo last year showed some moderate AI.  This will need to be repeated again in the future.  He remains in A. fib today which is rate controlled.  He denies any adverse bleeding on Eliquis.  PMHx:  Past Medical History:  Diagnosis Date  . Hyperlipidemia   . Hypertension   . Longstanding persistent atrial fibrillation [facility])     Past Surgical History:  Procedure Laterality Date  . BROKE LEG  11/1998  . CARDIOVERSION N/A 06/08/2015   Procedure: CARDIOVERSION;  Surgeon: Pixie Casino, MD;  Location: [facility] ENDOSCOPY;  Service: Cardiovascular;  Laterality: N/A;  . CARDIOVERSION N/A 09/11/2015   Procedure: CARDIOVERSION;  Surgeon: Pixie Casino, MD;  Location[facility] ENDOSCOPY;  Service: Cardiovascular;  Laterality: N/A;  . NASAL SEPTOPLASTY W/ TURBINOPLASTY Bilateral 12/16/2016   Procedure: NASAL SEPTOPLASTY WITH BILATERAL TURBINATE REDUCTION;  Surgeon: Leta Baptist, MD;  Location: [facility];  Service: ENT;  Laterality: Bilateral;  . NM MYOCAR PERF WALL MOTION  05/04/2013   bruce myoview - normal stress nuclear study; a-flutter & a-fib with RVR, EF 59%    FAMHx:  Family History  Problem Relation Age of Onset  . Hyperlipidemia Mother   . Hypertension Mother   . Emphysema Mother   . Diabetes Father   . Dementia Father   . Anemia Neg Hx   . Arrhythmia Neg Hx   . Asthma Neg Hx   . Clotting disorder Neg Hx   . Fainting Neg Hx   . Heart attack Neg Hx   . Heart disease Neg Hx   . Heart failure Neg Hx     SOCHx:   reports that he has quit smoking. His smoking use included cigars. He has never used smokeless tobacco. He reports current  alcohol use of about 6.0 standard drinks of alcohol per week. He reports that he does not use drugs.  ALLERGIES:  Allergies  Allergen Reactions  . Other Rash    Muscle Relaxer (unsure of name)    ROS: Pertinent items noted in HPI and remainder of comprehensive ROS otherwise negative.  HOME MEDS: Current Outpatient Medications  Medication Sig Dispense Refill  . chlorthalidone (HYGROTON) 25 MG tablet Take 0.5 tablets (12.5 mg total) by mouth daily. 90 tablet 3  . Coenzyme Q10 (  CO Q 10 PO) Take 1 capsule by mouth daily.    [name] diltiazem (CARDIZEM CD) 120 MG 24 hr capsule TAKE 1 CAPSULE BY MOUTH EVERY DAY 90 capsule 1  . ELIQUIS 5 MG TABS tablet TAKE 1 TABLET BY MOUTH TWICE A DAY 180 tablet 0  . lovastatin (MEVACOR) 40 MG tablet TAKE 1 TABLET BY MOUTH EVERY DAY 90 tablet 1  . metoprolol tartrate (LOPRESSOR) 25 MG tablet TAKE 1 AND 1/2 TABLETS BY MOUTH 2 (TWO) TIMES DAILY. 270 tablet 1  . Multiple Vitamin (MULTIVITAMIN) capsule Take 1 capsule by mouth daily.    [name] omega-3 fish oil (MAXEPA) 1000 MG CAPS capsule Take 2 capsules by mouth daily.     No current facility-administered medications for this visit.     LABS/IMAGING: No results found for this or any previous visit (from the past 48 hour(s)). No results found.  VITALS: BP (!) 142/91   Pulse 84   Temp (!) 97.3 F (36.3 C)   Ht 5\' 8"  (1.727 m)   Wt 189 lb 6.4 oz (85.9 kg)   SpO2 95%   BMI 28.80 kg/m   EXAM: General appearance: alert and no distress Neck: no carotid bruit, no JVD and thyroid not enlarged, symmetric, no tenderness/mass/nodules Lungs: clear to auscultation bilaterally Heart: regular rate and rhythm, S1, S2 normal and diastolic murmur: early diastolic 2/6, blowing at apex Abdomen: soft, non-tender; bowel sounds normal; no masses,  no organomegaly Extremities: extremities normal, atraumatic, no cyanosis or edema Pulses: 2+ and symmetric Skin: Skin color, texture, turgor normal. No rashes or lesions  Neurologic: Grossly normal Psych: Pleasant  EKG: Deferred  ASSESSMENT: 1. Persistent atrial fibrillation / flutter, with rapid ventricular response - CHADS2VASC score of 2 2. Hypertension 3. Dyslipidemia 4. OSA-snoring improved after septal surgery 5. History of nasal septal deviation - s/p septoplasty (12/2016) 6. Moderate aortic insufficiency  PLAN: 1.   [name] continues to do well with stable atrial fibrillation which is rate control.  His blood pressure is generally well controlled at home.  Cholesterol was recently assessed by his primary provider and is stable.  He denies any worsening shortness of breath.  He does have some moderate AI will need a repeat echo likely next year.  No changes to his medicines today.  Follow-up with me annually or sooner as necessary.  Pixie Casino, MD, [facility], [place] Director of the Advanced Lipid Disorders &  Cardiovascular Risk Reduction Clinic Diplomate of the American Board of Clinical Lipidology Attending Cardiologist  Direct Dial: [phone]  Fax: [phone]  Website:  [email or web address] 03/25/2019, 1:47 PM

## 2019-03-25 NOTE — Patient Instructions (Signed)
Medication Instructions:  Your physician recommends that you continue on your current medications as directed. Please refer to the Current Medication list given to you today.  *If you need a refill on your cardiac medications before your next appointment, please call your pharmacy*   Follow-Up: At [facility], you and your health needs are our priority.  As part of our continuing mission to provide you with exceptional heart care, we have created designated Provider Care Teams.  These Care Teams include your primary Cardiologist (physician) and Advanced Practice Providers (APPs -  Physician Assistants and Nurse Practitioners) who all work together to provide you with the care you need, when you need it.  Your next appointment:   12 months  The format for your next appointment:   In Person  Provider:   You may see Dr. Debara Pickett or one of the following Advanced Practice Providers on your designated Care Team:    Almyra Deforest, PA-C  Fabian Sharp, PA-C or   Roby Lofts, [place]

## 2019-06-21 ENCOUNTER — Other Ambulatory Visit: Payer: Self-pay | Admitting: Internal Medicine

## 2019-06-24 DIAGNOSIS — Z23 Encounter for immunization: Secondary | ICD-10-CM | POA: Diagnosis not present

## 2019-07-16 ENCOUNTER — Other Ambulatory Visit: Payer: Self-pay | Admitting: Internal Medicine

## 2019-08-13 ENCOUNTER — Ambulatory Visit: Payer: PPO | Attending: Internal Medicine

## 2019-08-13 DIAGNOSIS — Z23 Encounter for immunization: Secondary | ICD-10-CM | POA: Insufficient documentation

## 2019-08-13 NOTE — Progress Notes (Signed)
   Covid-19 Vaccination Clinic  Name:  Steve Weaver    MRN: [id number] DOB: [birth year]  08/13/2019  [name] was observed post Covid-19 immunization for 15 minutes without incident. He was provided with Vaccine Information Sheet and instruction to access the V-Safe system.   [name] was instructed to call 911 with any severe reactions post vaccine: [name] Difficulty breathing  . Swelling of face and throat  . A fast heartbeat  . A bad rash all over body  . Dizziness and weakness

## 2019-08-29 ENCOUNTER — Other Ambulatory Visit: Payer: Self-pay | Admitting: Internal Medicine

## 2019-08-31 ENCOUNTER — Other Ambulatory Visit: Payer: Self-pay | Admitting: Internal Medicine

## 2019-08-31 DIAGNOSIS — I4892 Unspecified atrial flutter: Secondary | ICD-10-CM

## 2019-09-02 DIAGNOSIS — D6869 Other thrombophilia: Secondary | ICD-10-CM | POA: Diagnosis not present

## 2019-09-02 DIAGNOSIS — N1831 Chronic kidney disease, stage 3a: Secondary | ICD-10-CM | POA: Diagnosis not present

## 2019-09-02 DIAGNOSIS — G4733 Obstructive sleep apnea (adult) (pediatric): Secondary | ICD-10-CM | POA: Diagnosis not present

## 2019-09-02 DIAGNOSIS — I129 Hypertensive chronic kidney disease with stage 1 through stage 4 chronic kidney disease, or unspecified chronic kidney disease: Secondary | ICD-10-CM | POA: Diagnosis not present

## 2019-09-02 DIAGNOSIS — Z1331 Encounter for screening for depression: Secondary | ICD-10-CM | POA: Diagnosis not present

## 2019-09-02 DIAGNOSIS — M5136 Other intervertebral disc degeneration, lumbar region: Secondary | ICD-10-CM | POA: Diagnosis not present

## 2019-09-02 DIAGNOSIS — Z7901 Long term (current) use of anticoagulants: Secondary | ICD-10-CM | POA: Diagnosis not present

## 2019-09-02 DIAGNOSIS — I480 Paroxysmal atrial fibrillation: Secondary | ICD-10-CM | POA: Diagnosis not present

## 2019-09-02 DIAGNOSIS — E7800 Pure hypercholesterolemia, unspecified: Secondary | ICD-10-CM | POA: Diagnosis not present

## 2019-09-02 DIAGNOSIS — D692 Other nonthrombocytopenic purpura: Secondary | ICD-10-CM | POA: Diagnosis not present

## 2019-09-08 ENCOUNTER — Ambulatory Visit: Payer: PPO | Attending: Internal Medicine

## 2019-09-08 DIAGNOSIS — Z23 Encounter for immunization: Secondary | ICD-10-CM

## 2019-09-08 NOTE — Progress Notes (Signed)
   Covid-19 Vaccination Clinic  Name:  Steve Weaver    MRN: [id number] DOB: 1950/08/21  09/08/2019  Mr. [name] was observed post Covid-19 immunization for 15 minutes without incident. He was provided with Vaccine Information Sheet and instruction to access the V-Safe system.   Mr. [name] was instructed to call 911 with any severe reactions post vaccine: [name] Difficulty breathing  . Swelling of face and throat  . A fast heartbeat  . A bad rash all over body  . Dizziness and weakness   Immunizations Administered    Name Date Dose VIS Date Route   Pfizer COVID-19 Vaccine 09/08/2019  3:36 PM 0.3 mL 05/21/2019 Intramuscular   Manufacturer: [place]   Lot: [id number]   [place]: [id number]

## 2019-09-11 ENCOUNTER — Other Ambulatory Visit: Payer: Self-pay | Admitting: Internal Medicine

## 2019-12-05 ENCOUNTER — Other Ambulatory Visit: Payer: Self-pay | Admitting: Internal Medicine

## 2020-02-22 ENCOUNTER — Other Ambulatory Visit: Payer: Self-pay | Admitting: Internal Medicine

## 2020-02-22 DIAGNOSIS — I4892 Unspecified atrial flutter: Secondary | ICD-10-CM

## 2020-03-05 ENCOUNTER — Other Ambulatory Visit: Payer: Self-pay | Admitting: Internal Medicine

## 2020-03-06 DIAGNOSIS — E7800 Pure hypercholesterolemia, unspecified: Secondary | ICD-10-CM | POA: Diagnosis not present

## 2020-03-06 DIAGNOSIS — Z125 Encounter for screening for malignant neoplasm of prostate: Secondary | ICD-10-CM | POA: Diagnosis not present

## 2020-03-13 DIAGNOSIS — Z23 Encounter for immunization: Secondary | ICD-10-CM | POA: Diagnosis not present

## 2020-03-13 DIAGNOSIS — Z7901 Long term (current) use of anticoagulants: Secondary | ICD-10-CM | POA: Diagnosis not present

## 2020-03-13 DIAGNOSIS — D751 Secondary polycythemia: Secondary | ICD-10-CM | POA: Diagnosis not present

## 2020-03-13 DIAGNOSIS — D6869 Other thrombophilia: Secondary | ICD-10-CM | POA: Diagnosis not present

## 2020-03-13 DIAGNOSIS — G4733 Obstructive sleep apnea (adult) (pediatric): Secondary | ICD-10-CM | POA: Diagnosis not present

## 2020-03-13 DIAGNOSIS — D692 Other nonthrombocytopenic purpura: Secondary | ICD-10-CM | POA: Diagnosis not present

## 2020-03-13 DIAGNOSIS — M5136 Other intervertebral disc degeneration, lumbar region: Secondary | ICD-10-CM | POA: Diagnosis not present

## 2020-03-13 DIAGNOSIS — Z0000 Encounter for general adult medical examination without abnormal findings: Secondary | ICD-10-CM | POA: Diagnosis not present

## 2020-03-13 DIAGNOSIS — N1831 Chronic kidney disease, stage 3a: Secondary | ICD-10-CM | POA: Diagnosis not present

## 2020-03-13 DIAGNOSIS — R82998 Other abnormal findings in urine: Secondary | ICD-10-CM | POA: Diagnosis not present

## 2020-03-13 DIAGNOSIS — I480 Paroxysmal atrial fibrillation: Secondary | ICD-10-CM | POA: Diagnosis not present

## 2020-03-13 DIAGNOSIS — I129 Hypertensive chronic kidney disease with stage 1 through stage 4 chronic kidney disease, or unspecified chronic kidney disease: Secondary | ICD-10-CM | POA: Diagnosis not present

## 2020-03-13 DIAGNOSIS — E7800 Pure hypercholesterolemia, unspecified: Secondary | ICD-10-CM | POA: Diagnosis not present

## 2020-04-03 DIAGNOSIS — Z1212 Encounter for screening for malignant neoplasm of rectum: Secondary | ICD-10-CM | POA: Diagnosis not present

## 2020-04-13 ENCOUNTER — Ambulatory Visit: Payer: PPO | Admitting: Internal Medicine

## 2020-04-13 ENCOUNTER — Other Ambulatory Visit: Payer: Self-pay

## 2020-04-13 ENCOUNTER — Encounter: Payer: Self-pay | Admitting: Internal Medicine

## 2020-04-13 VITALS — BP 134/80 | HR 78 | Ht 68.0 in | Wt 189.4 lb

## 2020-04-13 DIAGNOSIS — M549 Dorsalgia, unspecified: Secondary | ICD-10-CM | POA: Diagnosis not present

## 2020-04-13 DIAGNOSIS — I4819 Other persistent atrial fibrillation: Secondary | ICD-10-CM

## 2020-04-13 DIAGNOSIS — Z8679 Personal history of other diseases of the circulatory system: Secondary | ICD-10-CM | POA: Diagnosis not present

## 2020-04-13 DIAGNOSIS — I10 Essential (primary) hypertension: Secondary | ICD-10-CM

## 2020-04-13 NOTE — Patient Instructions (Signed)
Medication Instructions:  Your physician recommends that you continue on your current medications as directed. Please refer to the Current Medication list given to you today.  *If you need a refill on your cardiac medications before your next appointment, please call your pharmacy*   Testing/Procedures: Your physician has requested that you have an echocardiogram. Echocardiography is a painless test that uses sound waves to create images of your heart. It provides your doctor with information about the size and shape of your heart and how well your heart's chambers and valves are working. This procedure takes approximately one hour. There are no restrictions for this procedure. --- [address]   Follow-Up: At [facility], you and your health needs are our priority.  As part of our continuing mission to provide you with exceptional heart care, we have created designated Provider Care Teams.  These Care Teams include your primary Cardiologist (physician) and Advanced Practice Providers (APPs -  Physician Assistants and Nurse Practitioners) who all work together to provide you with the care you need, when you need it.  We recommend signing up for the patient portal called "MyChart".  Sign up information is provided on this After Visit Summary.  MyChart is used to connect with patients for Virtual Visits (Telemedicine).  Patients are able to view lab/test results, encounter notes, upcoming appointments, etc.  Non-urgent messages can be sent to your provider as well.   To learn more about what you can do with MyChart, go to [email or web address].    Your next appointment:   12 month(s)  The format for your next appointment:   In Person  Provider:   You may see Dr. Debara Pickett or one of the following Advanced Practice Providers on your designated Care Team:    Almyra Deforest, PA-C  Fabian Sharp, [place] or   Roby Lofts, [place]    Other Instructions Your physician recommends  that you schedule a follow-up appointment in: Dr. Melina Schools with Rosanne Gutting

## 2020-04-13 NOTE — Progress Notes (Signed)
OFFICE NOTE  Chief Complaint:  Routine follow-up  Primary Care Physician: Tisovec, Fransico Him, MD  HPI:  Steve Weaver is a pleasant 69 year old male who has been experiencing some increasing fatigue with exertion and decreased exercise tolerance over the past several years. 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 He has since struggled, working a number of jobs in order to make ends meet.  He used to be very physically active however recently has stopped exercising and has had about 20 pounds of weight gain. He found that he's been more fatigued during certain activities for example planting some bushes in the yard caused him more shortness of breath and fatigue that he was typically use to. He was recently seen for a well check by his primary care provider, including followup on his hypertension and dyslipidemia. He was noted to have an irregular heartbeat an EKG performed apparently showed premature ventricular contractions. I subsequently received the EKG which shows premature atrial contractions. An EKG performed in our office today also shows PACs, for which she is not completely aware of and it is unclear whether this is new or old.  He denies any specific angina.  [name] underwent an echocardiogram and nuclear stress test in our office at the end of October and early November. The echocardiogram demonstrated normal systolic function with mild aortic insufficiency and mild mitral insufficiency. There was borderline right atrial enlargement.  He was reportedly very nervous about a stress test and at the onset was noted to be in a rapid heart rate of 150 which was regular and there were clear flutter waves. This terminated spontaneously. Perfusion imaging did not demonstrate any defects.  [name] stopped his lisinopril due to what he felt was low blood pressures. He brought in a list of his blood pressures today  and 8 do seem to be pretty well controlled at home. He reports some of his symptoms have improved. He denies any chest pain or significant recurrent palpitations.  I saw [name] back today in the office. He reports no complaints. He denies any chest pain, shortness of breath or worsening fatigue. Blood pressure was mildly elevated today 130/92. EKG shows atrial flutter with variable ventricular response at 119. He is completely unaware of this. He does have a history of paroxysmal atrial flutter in the past but it seems to be remotely. He is now 66 inches CHADSVASC score is 2. He is currently on aspirin only.  [name] for follow-up of atrial fibrillation. Unfortunately his cardioversion was not successful. We did have him undergo nuclear stress testing which showed no evidence of ischemia although the study was gated while he was in A. fib and his EF was reduced. I suspect this is a gating abnormality. We confirmed normal LV function by echo, however did show mild AI and MR. These findings are considered insignificant when contemplating using a 1C antiarrhythmic. We discussed options and I feel that our best chance of success in achieving sinus rhythm would be with antiarrhythmic therapy. I would recommend he start on flecainide 50 mg twice a day. He should continue Eliquis. He will need an EKG in 1 week after starting flecainide.  10/16/2015  [name] for follow-up. He underwent cardioversion for atrial fibrillation. He says that afterwards he felt significantly better and continues to feel more energized. However today I told him that he is back in atrial  flutter. This tells me that he may not be truly symptomatic with A. Fib/flutter. Obviously the flecainide was not sufficient enough to hold him and rhythm. We discussed options today including continuing to pursue rhythm control or perhaps rate control if he is truly asymptomatic. We could also consider referral for  ablation, although he since he's had A. fib and atrial flutter, while he may be able to be treated with regards to flutter, he may continue to have atrial fibrillation.  12/25/2015  [name] was seen today in follow-up. I maintain him on an increased dose of metoprolol which seems to have controlled his ventricular response. His heart rate is irregular today probably indicating persistent atrial fibrillation. He is on Eliquis without any bleeding problems. He continues to be asymptomatic therefore we will continue a rate control strategy. His recent sleep study came back abnormal suggestive of sleep apnea. He says this is probably related to upper airway problems as he has a history of nasal fracture and college which was significant and he has a deviated nasal septum. This is never been evaluated by an ENT doctor.  07/01/2016  [name] was seen today in follow-up. Recently I cleared him for ENT surgery however he is not yet undergone that. Blood pressure was elevated today 142/96 have her recheck was 138/80. He is overdue for a lipid profile and metabolic profile. He is in atrial fibrillation today this may be persistent and we have not pursued any further attempts for achieving sinus rhythm since he is completely asymptomatic. Heart rate is well-controlled. He is tolerating Eliquis without any bleeding problems.  12/30/2016  [name] for follow-up. He is doing really well. He just underwent nasal septoplasty. He says he is doing extremely well since then with marked improvement in his breathing and better oxygenation. His wife reports that he does not snore at night anymore. He remains in A. fib which is long-standing persistent at this point but he is asymptomatic with it. He is on Eliquis for CHADSVASC score 2. Blood pressure was well-controlled today. His cholesterol was at goal. He denies any chest pain or worsening shortness of breath.  01/27/2018  [name] for  follow-up.  He is concerned about worsening dyspnea and fatigue over the past year.  He says he has become more short of breath and gained about 40 pounds, which he attributes to taking beta-blocker and/or the diltiazem since they are listed side effects.  He also reports more fatigue.  He wonders if this could be related to his A. fib.  In the past he is not noted any symptoms related to A. fib, which makes me think it may be his weight gain or other symptoms such as heart disease or heart failure that could be causing his symptoms.  Nevertheless he wants to try to come off the medications and inquired about other attempts to get him back into rhythm.  He had twice failed cardioversions in the past and therefore we had pursued rate control and anticoagulation for the past 4 years.  04/29/2018  [name] for follow-up of his studies.  He underwent an echo and stress test for progressive dyspnea and fatigue.  This was performed in late August.  His stress test was low risk and demonstrated no ischemia.  His echo showed a low normal EF of 50 to 55% with moderate aortic insufficiency.  Is not clear whether this is contributing to his shortness of breath although he says  his symptoms have improved.  He did recently lose about 10 pounds.  This was due to being sick and he had some type of laryngeal pharyngitis which made it difficult for him to eat.  But pressure remains above goal.  With regards to A. fib he did see Dr. Rayann Heman recently.  He did not feel that [name] was a candidate for ablation rather he felt that we could consider dofetilide if he was really symptomatic.  At this point Isabell Jarvis is not quite sure if his symptoms are related to A. fib or not and would prefer to avoid additional medication.  His A. fib is rate controlled.  08/04/2018  [name] seen today in follow-up.  He has been placed on chlorthalidone.  He is blood pressure is now much better per improved.  Is now between 157 262  systolic over the low 03T diastolic.  He initially had some cramping on the medication however blood work shows normal potassium normal magnesium.  03/25/2019  [name] is seen today in follow-up.  He continues to do well.  He denies any chest pain or worsening shortness of breath.  Blood pressure at home is generally been well controlled.  His echo last year showed some moderate AI.  This will need to be repeated again in the future.  He remains in A. fib today which is rate controlled.  He denies any adverse bleeding on Eliquis.  04/13/2020  [name] is seen today for annual follow-up.  Overall he says he is doing fairly well.  His main issue seems to be with low back pain.  He has a history of exacerbations however more recently has had more consistent back pain and has been describing discomfort in the left groin area.  He thought he had pulled a muscle there and had imaging but it has not improved.  I wonder if this could be a referred pain.  Blood pressures well controlled today.  EKG shows atrial fibrillation which is persistent but rate controlled.  He denies any bleeding issues on Eliquis.  Lipids in September 2020 showed total cholesterol 193, HDL 49, LDL 108 and triglycerides 181.   PMHx:  Past Medical History:  Diagnosis Date  . Hyperlipidemia   . Hypertension   . Longstanding persistent atrial fibrillation [facility])     Past Surgical History:  Procedure Laterality Date  . BROKE LEG  11/1998  . CARDIOVERSION N/A 06/08/2015   Procedure: CARDIOVERSION;  Surgeon: Pixie Casino, MD;  Location: [facility] ENDOSCOPY;  Service: Cardiovascular;  Laterality: N/A;  . CARDIOVERSION N/A 09/11/2015   Procedure: CARDIOVERSION;  Surgeon: Pixie Casino, MD;  Location: [facility] ENDOSCOPY;  Service: Cardiovascular;  Laterality: N/A;  . NASAL SEPTOPLASTY W/ TURBINOPLASTY Bilateral 12/16/2016   Procedure: NASAL SEPTOPLASTY WITH BILATERAL TURBINATE REDUCTION;  Surgeon: Leta Baptist, MD;  Location: [place];  Service: ENT;  Laterality: Bilateral;  . NM MYOCAR PERF WALL MOTION  05/04/2013   bruce myoview - normal stress nuclear study; a-flutter & a-fib with RVR, EF 59%    FAMHx:  Family History  Problem Relation Age of Onset  . Hyperlipidemia Mother   . Hypertension Mother   . Emphysema Mother   . Diabetes Father   . Dementia Father   . Anemia Neg Hx   . Arrhythmia Neg Hx   . Asthma Neg Hx   . Clotting disorder Neg Hx   . Fainting Neg Hx   . Heart attack Neg Hx   .  Heart disease Neg Hx   . Heart failure Neg Hx     SOCHx:   reports that he has quit smoking. His smoking use included cigars. He has never used smokeless tobacco. He reports current alcohol use of about 6.0 standard drinks of alcohol per week. He reports that he does not use drugs.  ALLERGIES:  Allergies  Allergen Reactions  . Other Rash    Muscle Relaxer (unsure of name)    ROS: Pertinent items noted in HPI and remainder of comprehensive ROS otherwise negative.  HOME MEDS: Current Outpatient Medications  Medication Sig Dispense Refill  . chlorthalidone (HYGROTON) 25 MG tablet TAKE 1/2 TABLET BY MOUTH EVERY DAY 45 tablet 7  . Coenzyme Q10 (CO Q 10 PO) Take 1 capsule by mouth daily.    [name] diltiazem (CARDIZEM CD) 120 MG 24 hr capsule TAKE 1 CAPSULE BY MOUTH EVERY DAY 90 capsule 2  . ELIQUIS 5 MG TABS tablet TAKE 1 TABLET BY MOUTH TWICE A DAY 180 tablet 1  . lovastatin (MEVACOR) 40 MG tablet TAKE 1 TABLET BY MOUTH EVERY DAY Please call and schedule follow up office visit. 90 tablet 0  . metoprolol tartrate (LOPRESSOR) 25 MG tablet Take 1.5 tablets (37.5 mg total) by mouth 2 (two) times daily. 270 tablet 0  . Multiple Vitamin (MULTIVITAMIN) capsule Take 1 capsule by mouth daily.    [name] omega-3 fish oil (MAXEPA) 1000 MG CAPS capsule Take 2 capsules by mouth daily.     No current facility-administered medications for this visit.    LABS/IMAGING: No results found for this or any previous visit (from the past 48  hour(s)). No results found.  VITALS: BP 134/80   Pulse 78   Ht 5\' 8"  (1.727 m)   Wt 189 lb 6.4 oz (85.9 kg)   SpO2 99%   BMI 28.80 kg/m   EXAM: General appearance: alert and no distress Neck: no carotid bruit, no JVD and thyroid not enlarged, symmetric, no tenderness/mass/nodules Lungs: clear to auscultation bilaterally Heart: regular rate and rhythm, S1, S2 normal and diastolic murmur: early diastolic 2/6, blowing at apex Abdomen: soft, non-tender; bowel sounds normal; no masses,  no organomegaly Extremities: extremities normal, atraumatic, no cyanosis or edema Pulses: 2+ and symmetric Skin: Skin color, texture, turgor normal. No rashes or lesions Neurologic: Grossly normal Psych: Pleasant  EKG: A. fib at 78-personally reviewed  ASSESSMENT: 1. Persistent atrial fibrillation / flutter, with rapid ventricular response - CHADS2VASC score of 2 2. Hypertension 3. Dyslipidemia 4. OSA-snoring improved after septal surgery 5. History of nasal septal deviation - s/p septoplasty (12/2016) 6. Moderate aortic insufficiency 7. Low back pain/left groin pain  PLAN: 1.   [name] seems to be doing well from a cardiac standpoint.  He is in persistent A. fib but not necessarily aware of it.  He is tolerating Eliquis without bleeding issues.  Blood pressures well controlled.  Cholesterol is followed by his PCP.  He has a history of OSA but improved after septal surgery.  He was noted to have some moderate aortic insufficiency which will need to be reassessed by echo.  With regards to the low back and left groin pain, I encouraged him to seek out a orthospine physician and recommended Dr. Melina Schools with [facility].  He said he would try to reach out for an appointment.  Follow-up with me annually or sooner as necessary.  Pixie Casino, MD, [facility], [place] Director of the Advanced  Lipid Disorders &  Cardiovascular Risk Reduction Clinic Diplomate of  the American Board of Clinical Lipidology Attending Cardiologist  Direct Dial: [phone]  Fax: [phone]  Website:  [email or web address] [email or web address] Kallen Mccrystal 04/13/2020, 11:07 AM

## 2020-04-15 ENCOUNTER — Encounter: Payer: Self-pay | Admitting: Internal Medicine

## 2020-04-18 DIAGNOSIS — M5126 Other intervertebral disc displacement, lumbar region: Secondary | ICD-10-CM | POA: Diagnosis not present

## 2020-04-18 DIAGNOSIS — S3991XA Unspecified injury of abdomen, initial encounter: Secondary | ICD-10-CM | POA: Diagnosis not present

## 2020-04-18 DIAGNOSIS — M545 Low back pain, unspecified: Secondary | ICD-10-CM | POA: Diagnosis not present

## 2020-05-09 ENCOUNTER — Ambulatory Visit (HOSPITAL_COMMUNITY): Payer: PPO | Attending: Cardiology

## 2020-05-09 ENCOUNTER — Other Ambulatory Visit: Payer: Self-pay

## 2020-05-09 DIAGNOSIS — Z8679 Personal history of other diseases of the circulatory system: Secondary | ICD-10-CM | POA: Diagnosis not present

## 2020-05-09 DIAGNOSIS — I4819 Other persistent atrial fibrillation: Secondary | ICD-10-CM

## 2020-05-09 LAB — ECHOCARDIOGRAM COMPLETE: S' Lateral: 3.17 cm

## 2020-05-16 ENCOUNTER — Telehealth: Payer: Self-pay | Admitting: *Deleted

## 2020-05-16 DIAGNOSIS — M25552 Pain in left hip: Secondary | ICD-10-CM | POA: Diagnosis not present

## 2020-05-16 NOTE — Telephone Encounter (Signed)
   [facility] Pre-operative Risk Assessment    HEARTCARE STAFF: - Please ensure there is not already an duplicate clearance open for this procedure. - Under Visit Info/Reason for Call, type in Other and utilize the format Clearance MM/DD/YY or Clearance TBD. Do not use dashes or single digits. - If request is for dental extraction, please clarify the # of teeth to be extracted.  Request for surgical clearance:  1. What type of surgery is being performed? Left total hip arthroplasty   2. When is this surgery scheduled? TBD   3. What type of clearance is required (medical clearance vs. Pharmacy clearance to hold med vs. Both)? Medical   4. Are there any medications that need to be held prior to surgery and how long?none   5. Practice name and name of physician performing surgery? [facility]   6. What is the office phone number? [phone]   7.   What is the office fax number? [phone]  8.   Anesthesia type (None, local, MAC, general) ? spinal   Steve Weaver 05/16/2020, 2:48 PM  _________________________________________________________________   (provider comments below)

## 2020-05-16 NOTE — Telephone Encounter (Signed)
Pharmacy I received a request to clear this patient for hip surgery.  Though it was not mentioned in the request the patient is on Eliquis and I suspect they are going to want to hold this for his hip surgery.  If you could comment on this I will contact the patient for cardiac clearance.  Kerin Ransom PA-C 05/16/2020 4:15 PM

## 2020-05-17 ENCOUNTER — Other Ambulatory Visit: Payer: Self-pay | Admitting: Internal Medicine

## 2020-05-17 DIAGNOSIS — I4892 Unspecified atrial flutter: Secondary | ICD-10-CM

## 2020-05-17 NOTE — Telephone Encounter (Signed)
Patient with diagnosis of afib on Eliquis for anticoagulation.    Procedure: Left total hip arthroplasty  Date of procedure: TBD    CHA2DS2-VASc Score = 2  This indicates a 2.2% annual risk of stroke. The patient's score is based upon: CHF History: 0 HTN History: 1 Diabetes History: 0 Stroke History: 0 Vascular Disease History: 0 Age Score: 1 Gender Score: 0      CrCl 50.42 ml/min  Per office protocol, patient can hold Eliquis for 3 days prior to procedure.

## 2020-05-17 NOTE — Telephone Encounter (Signed)
   Primary Cardiologist: Dr. Debara Pickett  Chart reviewed as part of pre-operative protocol coverage. Patient was last seen by Dr. Debara Pickett on 04/13/2020 at which time he was doing well from a cardiac standpoint. He was contacted for further pre-op evaluation. He reported doing well since last visit - no chest pain, shortness of breath, orthopnea, PND, edema, palpitations, lightheadedness, dizziness, or syncope. Able to complete >4.0 METS without any problems. Given past medical history and time since last visit, based on ACC/AHA guidelines, Steve Weaver would be at acceptable risk for the planned procedure without further cardiovascular testing.   The patient was advised that if he develops new symptoms prior to surgery to contact our office to arrange for a follow-up visit, and he verbalized understanding.  Per Pharmacy and office protocol, patient can hold Eliquis for 3 days prior to procedure. This should be restarted as soon as able postoperatively.   I will route this recommendation to the requesting party via Epic fax function and remove from pre-op pool.  Please call with questions.  Darreld Mclean, PA-C 05/17/2020, 11:25 AM

## 2020-05-19 ENCOUNTER — Ambulatory Visit: Payer: Self-pay | Admitting: Student

## 2020-05-25 ENCOUNTER — Other Ambulatory Visit: Payer: Self-pay | Admitting: Internal Medicine

## 2020-05-29 ENCOUNTER — Ambulatory Visit: Payer: Self-pay | Admitting: Student

## 2020-05-29 NOTE — H&P (Signed)
TOTAL HIP ADMISSION H&P  Patient is admitted for left total hip arthroplasty.  Subjective:  Chief Complaint: left hip pain  HPI: Steve Weaver, 69 y.o. male, has a history of pain and functional disability in the left hip(s) due to arthritis and patient has failed non-surgical conservative treatments for greater than 12 weeks to include NSAID's and/or analgesics and activity modification.  Onset of symptoms was gradual starting 2 years ago with gradually worsening course since that time.The patient noted no past surgery on the left hip(s).  Patient currently rates pain in the left hip at 8 out of 10 with activity. Patient has worsening of pain with activity and weight bearing, pain that interfers with activities of daily living and pain with passive range of motion. Patient has evidence of joint space narrowing by imaging studies. This condition presents safety issues increasing the risk of falls.  There is no current active infection.  Patient Active Problem List   Diagnosis Date Noted  . Shortness of breath 01/27/2018  . History of aortic valve insufficiency 01/27/2018  . Sleep apnea 12/25/2015  . Nasal septal defect 12/25/2015  . Tinnitus 12/25/2015  . Hx of closed fracture of nasal bones 12/25/2015  . Persistent atrial fibrillation ([place])   . Atrial fibrillation ([place]) 05/29/2015  . Fatigue 04/28/2013  . Atrial flutter, paroxysmal ([place]) 04/28/2013  . PAC (premature atrial contraction) 04/28/2013  . Essential hypertension 04/28/2013  . Dyslipidemia 04/28/2013   Past Medical History:  Diagnosis Date  . Hyperlipidemia   . Hypertension   . Longstanding persistent atrial fibrillation [facility])     Past Surgical History:  Procedure Laterality Date  . BROKE LEG  11/1998  . CARDIOVERSION N/A 06/08/2015   Procedure: CARDIOVERSION;  Surgeon: Pixie Casino, MD;  Location: [facility] ENDOSCOPY;  Service: Cardiovascular;  Laterality: N/A;  . CARDIOVERSION N/A 09/11/2015   Procedure: CARDIOVERSION;   Surgeon: Pixie Casino, MD;  Location: [facility] ENDOSCOPY;  Service: Cardiovascular;  Laterality: N/A;  . NASAL SEPTOPLASTY W/ TURBINOPLASTY Bilateral 12/16/2016   Procedure: NASAL SEPTOPLASTY WITH BILATERAL TURBINATE REDUCTION;  Surgeon: Leta Baptist, MD;  Location: [place];  Service: ENT;  Laterality: Bilateral;  . NM MYOCAR PERF WALL MOTION  05/04/2013   bruce myoview - normal stress nuclear study; a-flutter & a-fib with RVR, EF 59%    Current Outpatient Medications  Medication Sig Dispense Refill Last Dose  . chlorthalidone (HYGROTON) 25 MG tablet TAKE 1/2 TABLET BY MOUTH EVERY DAY 45 tablet 7   . Coenzyme Q10 (CO Q 10 PO) Take 1 capsule by mouth daily.     [name] diltiazem (CARDIZEM CD) 120 MG 24 hr capsule TAKE 1 CAPSULE BY MOUTH EVERY DAY 90 capsule 2   . ELIQUIS 5 MG TABS tablet TAKE 1 TABLET BY MOUTH TWICE A DAY 180 tablet 1   . lovastatin (MEVACOR) 40 MG tablet Take 1 tablet (40 mg total) by mouth daily. 90 tablet 3   . metoprolol tartrate (LOPRESSOR) 25 MG tablet TAKE 1 & 1/2 TABLETS BY MOUTH TWICE A DAY 270 tablet 3   . Multiple Vitamin (MULTIVITAMIN) capsule Take 1 capsule by mouth daily.     [name] omega-3 fish oil (MAXEPA) 1000 MG CAPS capsule Take 2 capsules by mouth daily.      No current facility-administered medications for this visit.   Allergies  Allergen Reactions  . Other Rash    Muscle Relaxer (unsure of name)    Social History   Tobacco Use  .  Smoking status: Former Smoker    Types: Cigars  . Smokeless tobacco: Never Used  . Tobacco comment: 1-2 cigars/month  Substance Use Topics  . Alcohol use: Yes    Alcohol/week: 6.0 standard drinks    Types: 6 Standard drinks or equivalent per week    Family History  Problem Relation Age of Onset  . Hyperlipidemia Mother   . Hypertension Mother   . Emphysema Mother   . Diabetes Father   . Dementia Father   . Anemia Neg Hx   . Arrhythmia Neg Hx   . Asthma Neg Hx   . Clotting disorder Neg Hx   . Fainting Neg Hx    . Heart attack Neg Hx   . Heart disease Neg Hx   . Heart failure Neg Hx      Review of Systems  Constitutional: Negative.   HENT: Negative.   Eyes: Negative.   Respiratory: Negative.   Cardiovascular: Negative.   Gastrointestinal: Negative.   Endocrine: Negative.   Genitourinary: Negative.   Musculoskeletal: Positive for arthralgias.  Skin: Negative.   Allergic/Immunologic: Negative.   Neurological: Negative.   Hematological: Negative.   Psychiatric/Behavioral: Negative.     Objective:  Physical Exam HENT:     Head: Normocephalic.  Eyes:     Pupils: Pupils are equal, round, and reactive to light.  Cardiovascular:     Rate and Rhythm: Normal rate. Rhythm irregular.     Pulses: Normal pulses.     Heart sounds: Normal heart sounds.  Pulmonary:     Breath sounds: Normal breath sounds.  Abdominal:     Palpations: Abdomen is soft.     Tenderness: There is no abdominal tenderness.  Genitourinary:    Comments: Deferred Musculoskeletal:     Cervical back: Normal range of motion.     Comments: Examination of the left hip reveals no skin wounds or lesions. Mild trochanteric tenderness to palpation. Severely restricted range of motion of the left hip. Pain with terminal flexion and rotation. Pain in the position of impingement.  Skin:    General: Skin is warm and dry.  Neurological:     Mental Status: He is alert and oriented to person, place, and time.  Psychiatric:        Mood and Affect: Mood normal.     Vital signs in last 24 hours: @VSRANGES @  Labs:   Estimated body mass index is 28.8 kg/m as calculated from the following:   Height as of 04/13/20: 5\' 8"  (1.727 m).   Weight as of 04/13/20: 85.9 kg.   Imaging Review Plain radiographs demonstrate severe degenerative joint disease of the left hip(s). The bone quality appears to be adequate for age and reported activity level.      Assessment/Plan:  End stage arthritis, left hip(s)  The patient history,  physical examination, clinical judgement of the provider and imaging studies are consistent with end stage degenerative joint disease of the left hip(s) and total hip arthroplasty is deemed medically necessary. The treatment options including medical management, injection therapy, arthroscopy and arthroplasty were discussed at length. The risks and benefits of total hip arthroplasty were presented and reviewed. The risks due to aseptic loosening, infection, stiffness, dislocation/subluxation,  thromboembolic complications and other imponderables were discussed.  The patient acknowledged the explanation, agreed to proceed with the plan and consent was signed. Patient is being admitted for inpatient treatment for surgery, pain control, PT, OT, prophylactic antibiotics, VTE prophylaxis, progressive ambulation and ADL's and discharge planning.The patient is planning  to be discharged home on the same day

## 2020-05-29 NOTE — H&P (View-Only) (Signed)
TOTAL HIP ADMISSION H&P  Patient is admitted for left total hip arthroplasty.  Subjective:  Chief Complaint: left hip pain  HPI: Steve Weaver, 69 y.o. male, has a history of pain and functional disability in the left hip(s) due to arthritis and patient has failed non-surgical conservative treatments for greater than 12 weeks to include NSAID's and/or analgesics and activity modification.  Onset of symptoms was gradual starting 2 years ago with gradually worsening course since that time.The patient noted no past surgery on the left hip(s).  Patient currently rates pain in the left hip at 8 out of 10 with activity. Patient has worsening of pain with activity and weight bearing, pain that interfers with activities of daily living and pain with passive range of motion. Patient has evidence of joint space narrowing by imaging studies. This condition presents safety issues increasing the risk of falls.  There is no current active infection.  Patient Active Problem List   Diagnosis Date Noted  . Shortness of breath 01/27/2018  . History of aortic valve insufficiency 01/27/2018  . Sleep apnea 12/25/2015  . Nasal septal defect 12/25/2015  . Tinnitus 12/25/2015  . Hx of closed fracture of nasal bones 12/25/2015  . Persistent atrial fibrillation ([place])   . Atrial fibrillation ([place]) 05/29/2015  . Fatigue 04/28/2013  . Atrial flutter, paroxysmal ([place]) 04/28/2013  . PAC (premature atrial contraction) 04/28/2013  . Essential hypertension 04/28/2013  . Dyslipidemia 04/28/2013   Past Medical History:  Diagnosis Date  . Hyperlipidemia   . Hypertension   . Longstanding persistent atrial fibrillation [facility]     Past Surgical History:  Procedure Laterality Date  . BROKE LEG  11/1998  . CARDIOVERSION N/A 06/08/2015   Procedure: CARDIOVERSION;  Surgeon: Pixie Casino, MD;  Location: [facility] ENDOSCOPY;  Service: Cardiovascular;  Laterality: N/A;  . CARDIOVERSION N/A 09/11/2015   Procedure: CARDIOVERSION;   Surgeon: Pixie Casino, MD;  Location: [facility] ENDOSCOPY;  Service: Cardiovascular;  Laterality: N/A;  . NASAL SEPTOPLASTY W/ TURBINOPLASTY Bilateral 12/16/2016   Procedure: NASAL SEPTOPLASTY WITH BILATERAL TURBINATE REDUCTION;  Surgeon: Leta Baptist, MD;  Location: [facility];  Service: ENT;  Laterality: Bilateral;  . NM MYOCAR PERF WALL MOTION  05/04/2013   bruce myoview - normal stress nuclear study; a-flutter & a-fib with RVR, EF 59%    Current Outpatient Medications  Medication Sig Dispense Refill Last Dose  . chlorthalidone (HYGROTON) 25 MG tablet TAKE 1/2 TABLET BY MOUTH EVERY DAY 45 tablet 7   . Coenzyme Q10 (CO Q 10 PO) Take 1 capsule by mouth daily.     [name] diltiazem (CARDIZEM CD) 120 MG 24 hr capsule TAKE 1 CAPSULE BY MOUTH EVERY DAY 90 capsule 2   . ELIQUIS 5 MG TABS tablet TAKE 1 TABLET BY MOUTH TWICE A DAY 180 tablet 1   . lovastatin (MEVACOR) 40 MG tablet Take 1 tablet (40 mg total) by mouth daily. 90 tablet 3   . metoprolol tartrate (LOPRESSOR) 25 MG tablet TAKE 1 & 1/2 TABLETS BY MOUTH TWICE A DAY 270 tablet 3   . Multiple Vitamin (MULTIVITAMIN) capsule Take 1 capsule by mouth daily.     [name] omega-3 fish oil (MAXEPA) 1000 MG CAPS capsule Take 2 capsules by mouth daily.      No current facility-administered medications for this visit.   Allergies  Allergen Reactions  . Other Rash    Muscle Relaxer (unsure of name)    Social History   Tobacco Use  .  Smoking status: Former Smoker    Types: Cigars  . Smokeless tobacco: Never Used  . Tobacco comment: 1-2 cigars/month  Substance Use Topics  . Alcohol use: Yes    Alcohol/week: 6.0 standard drinks    Types: 6 Standard drinks or equivalent per week    Family History  Problem Relation Age of Onset  . Hyperlipidemia Mother   . Hypertension Mother   . Emphysema Mother   . Diabetes Father   . Dementia Father   . Anemia Neg Hx   . Arrhythmia Neg Hx   . Asthma Neg Hx   . Clotting disorder Neg Hx   . Fainting Neg Hx    . Heart attack Neg Hx   . Heart disease Neg Hx   . Heart failure Neg Hx      Review of Systems  Constitutional: Negative.   HENT: Negative.   Eyes: Negative.   Respiratory: Negative.   Cardiovascular: Negative.   Gastrointestinal: Negative.   Endocrine: Negative.   Genitourinary: Negative.   Musculoskeletal: Positive for arthralgias.  Skin: Negative.   Allergic/Immunologic: Negative.   Neurological: Negative.   Hematological: Negative.   Psychiatric/Behavioral: Negative.     Objective:  Physical Exam HENT:     Head: Normocephalic.  Eyes:     Pupils: Pupils are equal, round, and reactive to light.  Cardiovascular:     Rate and Rhythm: Normal rate. Rhythm irregular.     Pulses: Normal pulses.     Heart sounds: Normal heart sounds.  Pulmonary:     Breath sounds: Normal breath sounds.  Abdominal:     Palpations: Abdomen is soft.     Tenderness: There is no abdominal tenderness.  Genitourinary:    Comments: Deferred Musculoskeletal:     Cervical back: Normal range of motion.     Comments: Examination of the left hip reveals no skin wounds or lesions. Mild trochanteric tenderness to palpation. Severely restricted range of motion of the left hip. Pain with terminal flexion and rotation. Pain in the position of impingement.  Skin:    General: Skin is warm and dry.  Neurological:     Mental Status: He is alert and oriented to person, place, and time.  Psychiatric:        Mood and Affect: Mood normal.     Vital signs in last 24 hours: @VSRANGES @  Labs:   Estimated body mass index is 28.8 kg/m as calculated from the following:   Height as of 04/13/20: 5\' 8"  (1.727 m).   Weight as of 04/13/20: 85.9 kg.   Imaging Review Plain radiographs demonstrate severe degenerative joint disease of the left hip(s). The bone quality appears to be adequate for age and reported activity level.      Assessment/Plan:  End stage arthritis, left hip(s)  The patient history,  physical examination, clinical judgement of the provider and imaging studies are consistent with end stage degenerative joint disease of the left hip(s) and total hip arthroplasty is deemed medically necessary. The treatment options including medical management, injection therapy, arthroscopy and arthroplasty were discussed at length. The risks and benefits of total hip arthroplasty were presented and reviewed. The risks due to aseptic loosening, infection, stiffness, dislocation/subluxation,  thromboembolic complications and other imponderables were discussed.  The patient acknowledged the explanation, agreed to proceed with the plan and consent was signed. Patient is being admitted for inpatient treatment for surgery, pain control, PT, OT, prophylactic antibiotics, VTE prophylaxis, progressive ambulation and ADL's and discharge planning.The patient is planning  to be discharged home on the same day

## 2020-06-05 ENCOUNTER — Encounter (HOSPITAL_COMMUNITY): Payer: Self-pay

## 2020-06-05 NOTE — Progress Notes (Addendum)
PCP - Dr. Bernita Raisin Medical Cardiologist - Dr Clayton Lefort 04-13-20 Clearance 05-16-20 epic  PPM/ICD -  Device Orders -  Rep Notified -   Chest x-ray -  EKG - 04-13-20 epic Stress Test - 2019 epic ECHO - 04-12-20 epic Cardiac Cath -   Sleep Study -  CPAP -   Fasting Blood Sugar -  Checks Blood Sugar _____ times a day  Blood Thinner Instructions: Elequis hold 3 days Aspirin Instructions:  ERAS Protcol - PRE-SURGERY Ensure    COVID TEST- 06-13-19 Activity--Able to walk to mailbox down hill and uphill without sob  Anesthesia review: A fib HTN  Patient denies shortness of breath, fever, cough and chest pain at PAT appointment  NONE   All instructions explained to the patient, with a verbal understanding of the material. Patient agrees to go over the instructions while at home for a better understanding. Patient also instructed to self quarantine after being tested for COVID-19. The opportunity to ask questions was provided.

## 2020-06-05 NOTE — Patient Instructions (Signed)
DUE TO COVID-19 ONLY ONE VISITOR IS ALLOWED TO COME WITH YOU AND STAY IN THE WAITING ROOM ONLY DURING PRE OP AND PROCEDURE DAY OF SURGERY. THE 1 VISITOR  MAY VISIT WITH YOU AFTER SURGERY IN YOUR PRIVATE ROOM DURING VISITING HOURS ONLY!  YOU NEED TO HAVE A COVID 19 TEST ON_1-3-22______ @_______ , THIS TEST MUST BE DONE BEFORE SURGERY,  COVID TESTING SITE [address] [place] [address], IT IS ON THE RIGHT GOING OUT [address] APPROXIMATELY  2 MINUTES PAST [employer] ON THE RIGHT. ONCE YOUR COVID TEST IS COMPLETED,  PLEASE BEGIN THE QUARANTINE INSTRUCTIONS AS OUTLINED IN YOUR HANDOUT.                Steve Weaver  06/05/2020   Your procedure is scheduled on: 06-14-20   Report to [facility] Main  Entrance   Report to admitting at      0600 AM     Call this number if you have problems the morning of surgery [phone]    Remember: NO SOLID FOOD AFTER MIDNIGHT THE NIGHT PRIOR TO SURGERY. NOTHING BY MOUTH EXCEPT CLEAR LIQUIDS UNTIL     0530 am . PLEASE FINISH ENSURE DRINK PER SURGEON ORDER  WHICH NEEDS TO BE COMPLETED AT     0530 am then nothing by mouth .    CLEAR LIQUID DIET                                                                  water Black Coffee and tea, regular and decaf                            Plain Jell-O any favor except red or purple                                           Fruit ices (not with fruit pulp)                                     Iced Popsicles                                     Carbonated beverages, regular and diet                                    Cranberry, grape and apple juices Sports drinks like Gatorade Lightly seasoned clear broth or consume(fat free) Sugar, honey syrup _____________________________________________________________________    BRUSH YOUR TEETH MORNING OF SURGERY AND RINSE YOUR MOUTH OUT, NO CHEWING GUM CANDY OR MINTS.     Take these medicines the morning of surgery with A SIP OF WATER:  metoprolol, diltiazem                                 You  may not have any metal on your body including hair pins and              piercings  Do not wear jewelry, , lotions, powders or perfumes, deodorant                   Men may shave face and neck.   Do not bring valuables to the hospital. [place].  Contacts, dentures or bridgework may not be worn into surgery.      Patients discharged the day of surgery will not be allowed to drive home. IF YOU ARE HAVING SURGERY AND GOING HOME THE SAME DAY, YOU MUST HAVE AN ADULT TO DRIVE YOU HOME AND BE WITH YOU FOR 24 HOURS. YOU MAY GO HOME BY TAXI OR UBER OR ORTHERWISE, BUT AN ADULT MUST ACCOMPANY YOU HOME AND STAY WITH YOU FOR 24 HOURS.  Name and phone number of your driver:  Special Instructions: N/A              Please read over the following fact sheets you were given: _____________________________________________________________________             [facility] - Preparing for Surgery Before surgery, you can play an important role.  Because skin is not sterile, your skin needs to be as free of germs as possible.  You can reduce the number of germs on your skin by washing with CHG (chlorahexidine gluconate) soap before surgery.  CHG is an antiseptic cleaner which kills germs and bonds with the skin to continue killing germs even after washing. Please DO NOT use if you have an allergy to CHG or antibacterial soaps.  If your skin becomes reddened/irritated stop using the CHG and inform your nurse when you arrive at Short Stay. Do not shave (including legs and underarms) for at least 48 hours prior to the first CHG shower.  You may shave your face/neck. Please follow these instructions carefully:  1.  Shower with CHG Soap the night before surgery and the  morning of Surgery.  2.  If you choose to wash your hair, wash your hair first as usual with your  normal  shampoo.  3.  After you shampoo,  rinse your hair and body thoroughly to remove the  shampoo.                           4.  Use CHG as you would any other liquid soap.  You can apply chg directly  to the skin and wash                       Gently with a scrungie or clean washcloth.  5.  Apply the CHG Soap to your body ONLY FROM THE NECK DOWN.   Do not use on face/ open                           Wound or open sores. Avoid contact with eyes, ears mouth and genitals (private parts).                       Wash face,  Genitals (private parts) with your normal soap.             6.  Wash thoroughly,  paying special attention to the area where your surgery  will be performed.  7.  Thoroughly rinse your body with warm water from the neck down.  8.  DO NOT shower/wash with your normal soap after using and rinsing off  the CHG Soap.                9.  Pat yourself dry with a clean towel.            10.  Wear clean pajamas.            11.  Place clean sheets on your bed the night of your first shower and do not  sleep with pets. Day of Surgery : Do not apply any lotions/deodorants the morning of surgery.  Please wear clean clothes to the hospital/surgery center.  FAILURE TO FOLLOW THESE INSTRUCTIONS MAY RESULT IN THE CANCELLATION OF YOUR SURGERY PATIENT SIGNATURE_________________________________  NURSE SIGNATURE__________________________________  ________________________________________________________________________   Steve Weaver  An incentive spirometer is a tool that can help keep your lungs clear and active. This tool measures how well you are filling your lungs with each breath. Taking long deep breaths may help reverse or decrease the chance of developing breathing (pulmonary) problems (especially infection) following:  A long period of time when you are unable to move or be active. BEFORE THE PROCEDURE   If the spirometer includes an indicator to show your best effort, your nurse or respiratory therapist will set it  to a desired goal.  If possible, sit up straight or lean slightly forward. Try not to slouch.  Hold the incentive spirometer in an upright position. INSTRUCTIONS FOR USE  1. Sit on the edge of your bed if possible, or sit up as far as you can in bed or on a chair. 2. Hold the incentive spirometer in an upright position. 3. Breathe out normally. 4. Place the mouthpiece in your mouth and seal your lips tightly around it. 5. Breathe in slowly and as deeply as possible, raising the piston or the ball toward the top of the column. 6. Hold your breath for 3-5 seconds or for as long as possible. Allow the piston or ball to fall to the bottom of the column. 7. Remove the mouthpiece from your mouth and breathe out normally. 8. Rest for a few seconds and repeat Steps 1 through 7 at least 10 times every 1-2 hours when you are awake. Take your time and take a few normal breaths between deep breaths. 9. The spirometer may include an indicator to show your best effort. Use the indicator as a goal to work toward during each repetition. 10. After each set of 10 deep breaths, practice coughing to be sure your lungs are clear. If you have an incision (the cut made at the time of surgery), support your incision when coughing by placing a pillow or rolled up towels firmly against it. Once you are able to get out of bed, walk around indoors and cough well. You may stop using the incentive spirometer when instructed by your caregiver.  RISKS AND COMPLICATIONS  Take your time so you do not get dizzy or light-headed.  If you are in pain, you may need to take or ask for pain medication before doing incentive spirometry. It is harder to take a deep breath if you are having pain. AFTER USE  Rest and breathe slowly and easily.  It can be helpful to keep track of a log of your progress. Your caregiver can  provide you with a simple table to help with this. If you are using the spirometer at home, follow these  instructions: [place] IF:   You are having difficultly using the spirometer.  You have trouble using the spirometer as often as instructed.  Your pain medication is not giving enough relief while using the spirometer.  You develop fever of 100.5 F (38.1 C) or higher. SEEK IMMEDIATE MEDICAL CARE IF:   You cough up bloody sputum that had not been present before.  You develop fever of 102 F (38.9 C) or greater.  You develop worsening pain at or near the incision site. MAKE SURE YOU:   Understand these instructions.  Will watch your condition.  Will get help right away if you are not doing well or get worse. Document Released: 10/07/2006 Document Revised: 08/19/2011 Document Reviewed: 12/08/2006 [facility] Patient Information 2014 [place], [place].   ________________________________________________________________________

## 2020-06-06 ENCOUNTER — Other Ambulatory Visit: Payer: Self-pay

## 2020-06-06 ENCOUNTER — Encounter (HOSPITAL_COMMUNITY)
Admission: RE | Admit: 2020-06-06 | Discharge: 2020-06-06 | Disposition: A | Discharge status: Home / Self Care | Payer: PPO | Source: Ambulatory Visit | Attending: Orthopedic Surgery | Admitting: Orthopedic Surgery

## 2020-06-06 ENCOUNTER — Encounter (HOSPITAL_COMMUNITY): Payer: Self-pay

## 2020-06-06 DIAGNOSIS — Z01812 Encounter for preprocedural laboratory examination: Secondary | ICD-10-CM | POA: Diagnosis not present

## 2020-06-06 HISTORY — DX: Cardiac arrhythmia, unspecified: I49.9

## 2020-06-06 HISTORY — DX: Unspecified osteoarthritis, unspecified site: M19.90

## 2020-06-06 LAB — COMPREHENSIVE METABOLIC PANEL
ALT: 29 U/L (ref 0–44)
AST: 25 U/L (ref 15–41)
Albumin: 4.2 g/dL (ref 3.5–5.0)
Alkaline Phosphatase: 75 U/L (ref 38–126)
Anion gap: 11 (ref 5–15)
BUN: 27 mg/dL — ABNORMAL HIGH (ref 8–23)
CO2: 25 mmol/L (ref 22–32)
Calcium: 9.8 mg/dL (ref 8.9–10.3)
Chloride: 106 mmol/L (ref 98–111)
Creatinine, Ser: 1.11 mg/dL (ref 0.61–1.24)
GFR, Estimated: >60 mL/min (ref >60)
Glucose, Bld: 106 mg/dL — ABNORMAL HIGH (ref 70–99)
Potassium: 3.6 mmol/L (ref 3.5–5.1)
Sodium: 142 mmol/L (ref 135–145)
Total Bilirubin: 0.7 mg/dL (ref 0.3–1.2)
Total Protein: 7.5 g/dL (ref 6.5–8.1)

## 2020-06-06 LAB — SURGICAL PCR SCREEN
MRSA, PCR: NEGATIVE
Staphylococcus aureus: NEGATIVE

## 2020-06-06 LAB — URINALYSIS, ROUTINE W REFLEX MICROSCOPIC
Bilirubin Urine: NEGATIVE
Glucose, UA: NEGATIVE mg/dL
Hgb urine dipstick: NEGATIVE
Ketones, ur: NEGATIVE mg/dL
Leukocytes,Ua: NEGATIVE
Nitrite: NEGATIVE
Protein, ur: NEGATIVE mg/dL
Specific Gravity, Urine: 1.021 (ref 1.005–1.030)
pH: 5 (ref 5.0–8.0)

## 2020-06-06 LAB — CBC
HCT: 49.3 % (ref 39.0–52.0)
Hemoglobin: 17.2 g/dL — ABNORMAL HIGH (ref 13.0–17.0)
MCH: 33.8 pg (ref 26.0–34.0)
MCHC: 34.9 g/dL (ref 30.0–36.0)
MCV: 96.9 fL (ref 80.0–100.0)
Platelets: 264 10*3/uL (ref 150–400)
RBC: 5.09 MIL/uL (ref 4.22–5.81)
RDW: 12.3 % (ref 11.5–15.5)
WBC: 8.4 10*3/uL (ref 4.0–10.5)
nRBC: 0 % (ref 0.0–0.2)

## 2020-06-06 LAB — PROTIME-INR
INR: 1.1 (ref 0.8–1.2)
Prothrombin Time: 14 seconds (ref 11.4–15.2)

## 2020-06-08 NOTE — Anesthesia Preprocedure Evaluation (Addendum)
Anesthesia Evaluation  Patient identified by MRN, date of birth, ID band Patient awake    Reviewed: Allergy & Precautions, NPO status , Patient's Chart, lab work & pertinent test results, reviewed documented beta blocker date and time   Airway Mallampati: III  TM Distance: >3 FB Neck ROM: Full    Dental no notable dental hx. (+) Teeth Intact, Dental Advisory Given   Pulmonary sleep apnea , former smoker,  Had sleep study, per pt never got CPAP S/p septoplasty   Pulmonary exam normal breath sounds clear to auscultation       Cardiovascular hypertension, Pt. on medications and Pt. on home beta blockers +CHF (grade 1 diastolic dysfunction)  Normal cardiovascular exam+ dysrhythmias (eliquis, last dose 06/10/20) Atrial Fibrillation + Valvular Problems/Murmurs (mild MR) MR  Rhythm:Regular Rate:Normal  04/2020: 1. Left ventricular ejection fraction, by estimation, is 55 to 60%. The  left ventricle has normal function. The left ventricle has no regional  wall motion abnormalities. There is mild left ventricular hypertrophy.  Left ventricular diastolic parameters  are consistent with Grade I diastolic dysfunction (impaired relaxation).  2. Right ventricular systolic function is normal. The right ventricular  size is normal.  3. The mitral valve is normal in structure. Mild mitral valve  regurgitation. No evidence of mitral stenosis.  4. The aortic valve is normal in structure. Aortic valve regurgitation is  mild. No aortic stenosis is present.  5. The inferior vena cava is normal in size with greater than 50%  respiratory variability, suggesting right atrial pressure of 3 mmHg.    Neuro/Psych negative neurological ROS  negative psych ROS   GI/Hepatic negative GI ROS, Neg liver ROS,   Endo/Other  negative endocrine ROS  Renal/GU negative Renal ROS  negative genitourinary   Musculoskeletal  (+) Arthritis , Osteoarthritis,  L  hip DJD   Abdominal   Peds  Hematology negative hematology ROS (+) hct 49.3, plt 264   Anesthesia Other Findings   Reproductive/Obstetrics negative OB ROS                          Anesthesia Physical Anesthesia Plan  ASA: III  Anesthesia Plan: Spinal and MAC   Post-op Pain Management:    Induction:   PONV Risk Score and Plan: 2 and Propofol infusion and TIVA  Airway Management Planned: Natural Airway and Nasal Cannula  Additional Equipment: None  Intra-op Plan:   Post-operative Plan:   Informed Consent: I have reviewed the patients History and Physical, chart, labs and discussed the procedure including the risks, benefits and alternatives for the proposed anesthesia with the patient or authorized representative who has indicated his/her understanding and acceptance.       Plan Discussed with: CRNA  Anesthesia Plan Comments:       Anesthesia Quick Evaluation

## 2020-06-12 ENCOUNTER — Other Ambulatory Visit (HOSPITAL_COMMUNITY)
Admission: RE | Admit: 2020-06-12 | Discharge: 2020-06-12 | Disposition: A | Discharge status: Home / Self Care | Payer: PPO | Source: Ambulatory Visit | Attending: Orthopedic Surgery | Admitting: Orthopedic Surgery

## 2020-06-12 DIAGNOSIS — Z20822 Contact with and (suspected) exposure to covid-19: Secondary | ICD-10-CM | POA: Diagnosis not present

## 2020-06-12 DIAGNOSIS — Z01812 Encounter for preprocedural laboratory examination: Secondary | ICD-10-CM | POA: Insufficient documentation

## 2020-06-13 LAB — SARS CORONAVIRUS 2 (TAT 6-24 HRS): SARS Coronavirus 2: NEGATIVE

## 2020-06-14 ENCOUNTER — Encounter (HOSPITAL_COMMUNITY): Payer: Self-pay | Admitting: Orthopedic Surgery

## 2020-06-14 ENCOUNTER — Ambulatory Visit (HOSPITAL_COMMUNITY): Payer: PPO | Admitting: Physician Assistant

## 2020-06-14 ENCOUNTER — Ambulatory Visit (HOSPITAL_COMMUNITY): Payer: PPO

## 2020-06-14 ENCOUNTER — Ambulatory Visit (HOSPITAL_COMMUNITY): Payer: PPO | Admitting: Anesthesiology

## 2020-06-14 ENCOUNTER — Encounter (HOSPITAL_COMMUNITY)
Admission: RE | Disposition: A | Discharge status: Home / Self Care | Payer: Self-pay | Source: Home / Self Care | Attending: Orthopedic Surgery

## 2020-06-14 ENCOUNTER — Ambulatory Visit (HOSPITAL_COMMUNITY)
Admission: RE | Admit: 2020-06-14 | Discharge: 2020-06-15 | Disposition: A | Discharge status: Home / Self Care | Payer: PPO | Attending: Orthopedic Surgery | Admitting: Orthopedic Surgery

## 2020-06-14 DIAGNOSIS — Z471 Aftercare following joint replacement surgery: Secondary | ICD-10-CM | POA: Diagnosis not present

## 2020-06-14 DIAGNOSIS — Z79899 Other long term (current) drug therapy: Secondary | ICD-10-CM | POA: Insufficient documentation

## 2020-06-14 DIAGNOSIS — Z419 Encounter for procedure for purposes other than remedying health state, unspecified: Secondary | ICD-10-CM

## 2020-06-14 DIAGNOSIS — Z87891 Personal history of nicotine dependence: Secondary | ICD-10-CM | POA: Insufficient documentation

## 2020-06-14 DIAGNOSIS — I491 Atrial premature depolarization: Secondary | ICD-10-CM | POA: Diagnosis not present

## 2020-06-14 DIAGNOSIS — E785 Hyperlipidemia, unspecified: Secondary | ICD-10-CM | POA: Diagnosis not present

## 2020-06-14 DIAGNOSIS — Z9889 Other specified postprocedural states: Secondary | ICD-10-CM | POA: Diagnosis not present

## 2020-06-14 DIAGNOSIS — Z96642 Presence of left artificial hip joint: Secondary | ICD-10-CM | POA: Diagnosis not present

## 2020-06-14 DIAGNOSIS — R262 Difficulty in walking, not elsewhere classified: Secondary | ICD-10-CM | POA: Diagnosis not present

## 2020-06-14 DIAGNOSIS — Z888 Allergy status to other drugs, medicaments and biological substances status: Secondary | ICD-10-CM | POA: Insufficient documentation

## 2020-06-14 DIAGNOSIS — Z7901 Long term (current) use of anticoagulants: Secondary | ICD-10-CM | POA: Diagnosis not present

## 2020-06-14 DIAGNOSIS — Z09 Encounter for follow-up examination after completed treatment for conditions other than malignant neoplasm: Secondary | ICD-10-CM

## 2020-06-14 DIAGNOSIS — M1612 Unilateral primary osteoarthritis, left hip: Secondary | ICD-10-CM | POA: Diagnosis present

## 2020-06-14 DIAGNOSIS — I4819 Other persistent atrial fibrillation: Secondary | ICD-10-CM | POA: Diagnosis not present

## 2020-06-14 DIAGNOSIS — M6281 Muscle weakness (generalized): Secondary | ICD-10-CM | POA: Diagnosis not present

## 2020-06-14 HISTORY — PX: TOTAL HIP ARTHROPLASTY: SHX124

## 2020-06-14 LAB — TYPE AND SCREEN
ABO/RH(D): O POS
Antibody Screen: NEGATIVE

## 2020-06-14 LAB — ABO/RH: ABO/RH(D): O POS

## 2020-06-14 SURGERY — ARTHROPLASTY, HIP, TOTAL, ANTERIOR APPROACH
Anesthesia: Monitor Anesthesia Care | Site: Hip | Laterality: Left

## 2020-06-14 MED ORDER — LIDOCAINE 2% (20 MG/ML) 5 ML SYRINGE
INTRAMUSCULAR | Status: DC | PRN
  Administered 2020-06-14: 20 mg via INTRAVENOUS

## 2020-06-14 MED ORDER — APIXABAN 2.5 MG PO TABS
2.5000 mg | ORAL_TABLET | Freq: Two times a day (BID) | ORAL | Status: DC
  Administered 2020-06-15: 2.5 mg via ORAL
  Filled 2020-06-14: qty 1

## 2020-06-14 MED ORDER — PROPOFOL 10 MG/ML IV BOLUS
INTRAVENOUS | Status: AC
  Filled 2020-06-14: qty 20

## 2020-06-14 MED ORDER — BUPIVACAINE-EPINEPHRINE (PF) 0.25% -1:200000 IJ SOLN
INTRAMUSCULAR | Status: AC
  Filled 2020-06-14: qty 30

## 2020-06-14 MED ORDER — TRANEXAMIC ACID-NACL 1000-0.7 MG/100ML-% IV SOLN
1000.0000 mg | INTRAVENOUS | Status: AC
  Administered 2020-06-14: 1000 mg via INTRAVENOUS
  Filled 2020-06-14: qty 100

## 2020-06-14 MED ORDER — MIDAZOLAM HCL 2 MG/2ML IJ SOLN
INTRAMUSCULAR | Status: DC | PRN
  Administered 2020-06-14: 2 mg via INTRAVENOUS

## 2020-06-14 MED ORDER — CEFAZOLIN SODIUM-DEXTROSE 2-4 GM/100ML-% IV SOLN
2.0000 g | INTRAVENOUS | Status: AC
  Administered 2020-06-14: 2 g via INTRAVENOUS
  Filled 2020-06-14: qty 100

## 2020-06-14 MED ORDER — ACETAMINOPHEN 500 MG PO TABS: 1000.0000 mg | ORAL_TABLET | Freq: Once | ORAL | Status: DC

## 2020-06-14 MED ORDER — MIDAZOLAM HCL 2 MG/2ML IJ SOLN
INTRAMUSCULAR | Status: AC
  Filled 2020-06-14: qty 2

## 2020-06-14 MED ORDER — PRAVASTATIN SODIUM 20 MG PO TABS
40.0000 mg | ORAL_TABLET | Freq: Every day | ORAL | Status: DC
  Administered 2020-06-14: 40 mg via ORAL
  Filled 2020-06-14: qty 2

## 2020-06-14 MED ORDER — MORPHINE SULFATE (PF) 4 MG/ML IV SOLN: 0.5000 mg | INTRAVENOUS | Status: DC | PRN

## 2020-06-14 MED ORDER — KETOROLAC TROMETHAMINE 30 MG/ML IJ SOLN
INTRAMUSCULAR | Status: AC
  Filled 2020-06-14: qty 1

## 2020-06-14 MED ORDER — DIPHENHYDRAMINE HCL 12.5 MG/5ML PO ELIX: 12.5000 mg | ORAL_SOLUTION | ORAL | Status: DC | PRN

## 2020-06-14 MED ORDER — ONDANSETRON HCL 4 MG PO TABS: 4.0000 mg | ORAL_TABLET | Freq: Four times a day (QID) | ORAL | Status: DC | PRN

## 2020-06-14 MED ORDER — CEFAZOLIN SODIUM-DEXTROSE 2-4 GM/100ML-% IV SOLN
2.0000 g | Freq: Four times a day (QID) | INTRAVENOUS | Status: AC
  Administered 2020-06-14 (×2): 2 g via INTRAVENOUS
  Filled 2020-06-14 (×2): qty 100

## 2020-06-14 MED ORDER — SODIUM CHLORIDE 0.9 % IV SOLN: INTRAVENOUS | Status: DC

## 2020-06-14 MED ORDER — ZINC SULFATE 220 (50 ZN) MG PO CAPS
220.0000 mg | ORAL_CAPSULE | Freq: Every day | ORAL | Status: DC
  Administered 2020-06-15: 220 mg via ORAL
  Filled 2020-06-14: qty 1

## 2020-06-14 MED ORDER — ONDANSETRON HCL 4 MG/2ML IJ SOLN: 4.0000 mg | Freq: Four times a day (QID) | INTRAMUSCULAR | Status: DC | PRN

## 2020-06-14 MED ORDER — BUPIVACAINE IN DEXTROSE 0.75-8.25 % IT SOLN
INTRATHECAL | Status: DC | PRN
  Administered 2020-06-14: 2 mL via INTRATHECAL

## 2020-06-14 MED ORDER — PHENOL 1.4 % MT LIQD: 1.0000 | OROMUCOSAL | Status: DC | PRN

## 2020-06-14 MED ORDER — PROPOFOL 500 MG/50ML IV EMUL
INTRAVENOUS | Status: DC | PRN
  Administered 2020-06-14: 45 ug/kg/min via INTRAVENOUS

## 2020-06-14 MED ORDER — ACETAMINOPHEN 325 MG PO TABS: 325.0000 mg | ORAL_TABLET | Freq: Four times a day (QID) | ORAL | Status: DC | PRN

## 2020-06-14 MED ORDER — ORAL CARE MOUTH RINSE: 15.0000 mL | Freq: Once | OROMUCOSAL | Status: AC

## 2020-06-14 MED ORDER — HYDROMORPHONE HCL 1 MG/ML IJ SOLN: 0.2500 mg | INTRAMUSCULAR | Status: DC | PRN

## 2020-06-14 MED ORDER — FENTANYL CITRATE (PF) 100 MCG/2ML IJ SOLN
INTRAMUSCULAR | Status: DC | PRN
  Administered 2020-06-14: 50 ug via INTRAVENOUS

## 2020-06-14 MED ORDER — ONDANSETRON HCL 4 MG/2ML IJ SOLN
INTRAMUSCULAR | Status: DC | PRN
  Administered 2020-06-14: 4 mg via INTRAVENOUS

## 2020-06-14 MED ORDER — FENTANYL CITRATE (PF) 100 MCG/2ML IJ SOLN
INTRAMUSCULAR | Status: AC
  Filled 2020-06-14: qty 2

## 2020-06-14 MED ORDER — 0.9 % SODIUM CHLORIDE (POUR BTL) OPTIME
TOPICAL | Status: DC | PRN
  Administered 2020-06-14: 1000 mL

## 2020-06-14 MED ORDER — PROPOFOL 1000 MG/100ML IV EMUL
INTRAVENOUS | Status: AC
  Filled 2020-06-14: qty 100

## 2020-06-14 MED ORDER — BUPIVACAINE-EPINEPHRINE 0.25% -1:200000 IJ SOLN
INTRAMUSCULAR | Status: DC | PRN
  Administered 2020-06-14: 30 mL

## 2020-06-14 MED ORDER — METOCLOPRAMIDE HCL 5 MG PO TABS: 5.0000 mg | ORAL_TABLET | Freq: Three times a day (TID) | ORAL | Status: DC | PRN

## 2020-06-14 MED ORDER — OXYCODONE HCL 5 MG PO TABS: 5.0000 mg | ORAL_TABLET | Freq: Once | ORAL | Status: DC | PRN

## 2020-06-14 MED ORDER — SODIUM CHLORIDE 0.9 % IR SOLN
Status: DC | PRN
  Administered 2020-06-14: 1

## 2020-06-14 MED ORDER — HYDROCODONE-ACETAMINOPHEN 7.5-325 MG PO TABS: 1.0000 | ORAL_TABLET | ORAL | Status: DC | PRN

## 2020-06-14 MED ORDER — EPHEDRINE SULFATE-NACL 50-0.9 MG/10ML-% IV SOSY
PREFILLED_SYRINGE | INTRAVENOUS | Status: DC | PRN
  Administered 2020-06-14: 10 mg via INTRAVENOUS
  Administered 2020-06-14: 5 mg via INTRAVENOUS
  Administered 2020-06-14 (×2): 10 mg via INTRAVENOUS

## 2020-06-14 MED ORDER — WATER FOR IRRIGATION, STERILE IR SOLN
Status: DC | PRN
  Administered 2020-06-14: 1000 mL

## 2020-06-14 MED ORDER — MENTHOL 3 MG MT LOZG: 1.0000 | LOZENGE | OROMUCOSAL | Status: DC | PRN

## 2020-06-14 MED ORDER — POVIDONE-IODINE 10 % EX SWAB: 2.0000 "application " | Freq: Once | CUTANEOUS | Status: DC

## 2020-06-14 MED ORDER — METOPROLOL TARTRATE 25 MG PO TABS
37.5000 mg | ORAL_TABLET | Freq: Two times a day (BID) | ORAL | Status: DC
  Administered 2020-06-14 – 2020-06-15 (×2): 37.5 mg via ORAL
  Filled 2020-06-14 (×2): qty 1

## 2020-06-14 MED ORDER — SODIUM CHLORIDE (PF) 0.9 % IJ SOLN
INTRAMUSCULAR | Status: AC
  Filled 2020-06-14: qty 30

## 2020-06-14 MED ORDER — CHLORHEXIDINE GLUCONATE 0.12 % MT SOLN
15.0000 mL | Freq: Once | OROMUCOSAL | Status: AC
  Administered 2020-06-14: 15 mL via OROMUCOSAL

## 2020-06-14 MED ORDER — ONDANSETRON HCL 4 MG/2ML IJ SOLN
INTRAMUSCULAR | Status: AC
  Filled 2020-06-14: qty 2

## 2020-06-14 MED ORDER — DILTIAZEM HCL ER COATED BEADS 120 MG PO CP24
120.0000 mg | ORAL_CAPSULE | Freq: Every day | ORAL | Status: DC
  Administered 2020-06-15: 120 mg via ORAL
  Filled 2020-06-14: qty 1

## 2020-06-14 MED ORDER — ACETAMINOPHEN 10 MG/ML IV SOLN
1000.0000 mg | Freq: Once | INTRAVENOUS | Status: AC
  Administered 2020-06-14: 1000 mg via INTRAVENOUS
  Filled 2020-06-14: qty 100

## 2020-06-14 MED ORDER — DEXAMETHASONE SODIUM PHOSPHATE 10 MG/ML IJ SOLN
INTRAMUSCULAR | Status: AC
  Filled 2020-06-14: qty 1

## 2020-06-14 MED ORDER — HYDROCODONE-ACETAMINOPHEN 5-325 MG PO TABS: 1.0000 | ORAL_TABLET | ORAL | Status: DC | PRN

## 2020-06-14 MED ORDER — SODIUM CHLORIDE (PF) 0.9 % IJ SOLN
INTRAMUSCULAR | Status: DC | PRN
  Administered 2020-06-14: 30 mL

## 2020-06-14 MED ORDER — KETOROLAC TROMETHAMINE 30 MG/ML IJ SOLN
INTRAMUSCULAR | Status: DC | PRN
  Administered 2020-06-14: 30 mg

## 2020-06-14 MED ORDER — METHOCARBAMOL 500 MG IVPB - SIMPLE MED
500.0000 mg | Freq: Four times a day (QID) | INTRAVENOUS | Status: DC | PRN
  Filled 2020-06-14: qty 50

## 2020-06-14 MED ORDER — METHOCARBAMOL 500 MG PO TABS: 500.0000 mg | ORAL_TABLET | Freq: Four times a day (QID) | ORAL | Status: DC | PRN

## 2020-06-14 MED ORDER — ISOPROPYL ALCOHOL 70 % SOLN
Status: DC | PRN
  Administered 2020-06-14: 1 via TOPICAL

## 2020-06-14 MED ORDER — DOCUSATE SODIUM 100 MG PO CAPS
100.0000 mg | ORAL_CAPSULE | Freq: Two times a day (BID) | ORAL | Status: DC
  Administered 2020-06-14 – 2020-06-15 (×2): 100 mg via ORAL
  Filled 2020-06-14 (×2): qty 1

## 2020-06-14 MED ORDER — DEXAMETHASONE SODIUM PHOSPHATE 10 MG/ML IJ SOLN
10.0000 mg | Freq: Once | INTRAMUSCULAR | Status: AC
  Administered 2020-06-15: 10 mg via INTRAVENOUS
  Filled 2020-06-14: qty 1

## 2020-06-14 MED ORDER — VITAMIN D 25 MCG (1000 UNIT) PO TABS
2000.0000 [IU] | ORAL_TABLET | Freq: Every day | ORAL | Status: DC
  Administered 2020-06-14 – 2020-06-15 (×2): 2000 [IU] via ORAL
  Filled 2020-06-14 (×2): qty 2

## 2020-06-14 MED ORDER — CHLORTHALIDONE 25 MG PO TABS
12.5000 mg | ORAL_TABLET | Freq: Every day | ORAL | Status: DC
Start: 2020-06-14 — End: 2020-06-15
  Administered 2020-06-14 – 2020-06-15 (×2): 12.5 mg via ORAL
  Filled 2020-06-14 (×2): qty 1

## 2020-06-14 MED ORDER — ALUM & MAG HYDROXIDE-SIMETH 200-200-20 MG/5ML PO SUSP: 30.0000 mL | ORAL | Status: DC | PRN

## 2020-06-14 MED ORDER — OXYCODONE HCL 5 MG/5ML PO SOLN: 5.0000 mg | Freq: Once | ORAL | Status: DC | PRN

## 2020-06-14 MED ORDER — DEXAMETHASONE SODIUM PHOSPHATE 10 MG/ML IJ SOLN
INTRAMUSCULAR | Status: DC | PRN
  Administered 2020-06-14: 10 mg via INTRAVENOUS

## 2020-06-14 MED ORDER — POLYETHYLENE GLYCOL 3350 17 G PO PACK: 17.0000 g | PACK | Freq: Every day | ORAL | Status: DC | PRN

## 2020-06-14 MED ORDER — LACTATED RINGERS IV SOLN: INTRAVENOUS | Status: DC

## 2020-06-14 MED ORDER — POVIDONE-IODINE 10 % EX SWAB
2.0000 "application " | Freq: Once | CUTANEOUS | Status: AC
  Administered 2020-06-14: 2 via TOPICAL

## 2020-06-14 MED ORDER — ONDANSETRON HCL 4 MG/2ML IJ SOLN: 4.0000 mg | Freq: Once | INTRAMUSCULAR | Status: DC | PRN

## 2020-06-14 MED ORDER — METOCLOPRAMIDE HCL 5 MG/ML IJ SOLN
5.0000 mg | Freq: Three times a day (TID) | INTRAMUSCULAR | Status: DC | PRN
Start: 2020-06-14 — End: 2020-06-15

## 2020-06-14 SURGICAL SUPPLY — 65 items
ADH SKN CLS APL DERMABOND .7 (GAUZE/BANDAGES/DRESSINGS) ×1
APL PRP STRL LF DISP 70% ISPRP (MISCELLANEOUS) ×1
BAG DECANTER FOR FLEXI CONT (MISCELLANEOUS) IMPLANT
BAG SPEC THK2 15X12 ZIP CLS (MISCELLANEOUS)
BAG ZIPLOCK 12X15 (MISCELLANEOUS) IMPLANT
BLADE SURG SZ10 CARB STEEL (BLADE) IMPLANT
CHLORAPREP W/TINT 26 (MISCELLANEOUS) ×2 IMPLANT
COVER PERINEAL POST (MISCELLANEOUS) ×2 IMPLANT
COVER SURGICAL LIGHT HANDLE (MISCELLANEOUS) ×2 IMPLANT
COVER WAND RF STERILE (DRAPES) IMPLANT
CUP SECTOR GRIPTON 58MM (Orthopedic Implant) ×1 IMPLANT
DECANTER SPIKE VIAL GLASS SM (MISCELLANEOUS) ×2 IMPLANT
DERMABOND ADVANCED (GAUZE/BANDAGES/DRESSINGS) ×1
DERMABOND ADVANCED .7 DNX12 (GAUZE/BANDAGES/DRESSINGS) ×2 IMPLANT
DRAPE IMP U-DRAPE 54X76 (DRAPES) ×2 IMPLANT
DRAPE SHEET LG 3/4 BI-LAMINATE (DRAPES) ×6 IMPLANT
DRAPE STERI IOBAN 125X83 (DRAPES) IMPLANT
DRAPE U-SHAPE 47X51 STRL (DRAPES) ×4 IMPLANT
DRSG AQUACEL AG ADV 3.5X10 (GAUZE/BANDAGES/DRESSINGS) ×2 IMPLANT
DRSG AQUACEL AG ADV 3.5X14 (GAUZE/BANDAGES/DRESSINGS) ×1 IMPLANT
ELECT REM PT RETURN 15FT ADLT (MISCELLANEOUS) ×2 IMPLANT
GAUZE SPONGE 4X4 12PLY STRL (GAUZE/BANDAGES/DRESSINGS) ×2 IMPLANT
GLOVE BIO SURGEON STRL SZ8.5 (GLOVE) ×4 IMPLANT
GLOVE BIOGEL PI IND STRL 8.5 (GLOVE) ×1 IMPLANT
GLOVE BIOGEL PI INDICATOR 8.5 (GLOVE) ×1
GLOVE SRG 8 PF TXTR STRL LF DI (GLOVE) ×1 IMPLANT
GLOVE SURG ENC TEXT LTX SZ7.5 (GLOVE) ×4 IMPLANT
GLOVE SURG UNDER POLY LF SZ8 (GLOVE) ×2
GOWN SPEC L3 XXLG W/TWL (GOWN DISPOSABLE) ×2 IMPLANT
GOWN STRL REUS W/ TWL LRG LVL3 (GOWN DISPOSABLE) ×1 IMPLANT
GOWN STRL REUS W/TWL LRG LVL3 (GOWN DISPOSABLE) ×2
HANDPIECE INTERPULSE COAX TIP (DISPOSABLE) ×2
HEAD CERAMIC 36 PLUS 8.5 12 14 (Hips) ×1 IMPLANT
HOLDER FOLEY CATH W/STRAP (MISCELLANEOUS) ×2 IMPLANT
HOOD PEEL AWAY FLYTE STAYCOOL (MISCELLANEOUS) ×8 IMPLANT
JET LAVAGE IRRISEPT WOUND (IRRIGATION / IRRIGATOR) ×2
KIT TURNOVER KIT A (KITS) IMPLANT
LAVAGE JET IRRISEPT WOUND (IRRIGATION / IRRIGATOR) ×1 IMPLANT
LINER NEUTRAL 52X36X58N (Liner) ×1 IMPLANT
MANIFOLD NEPTUNE II (INSTRUMENTS) ×2 IMPLANT
MARKER SKIN DUAL TIP RULER LAB (MISCELLANEOUS) ×2 IMPLANT
NDL SAFETY ECLIPSE 18X1.5 (NEEDLE) ×1 IMPLANT
NDL SPNL 18GX3.5 QUINCKE PK (NEEDLE) ×1 IMPLANT
NEEDLE HYPO 18GX1.5 SHARP (NEEDLE) ×2
NEEDLE SPNL 18GX3.5 QUINCKE PK (NEEDLE) ×2 IMPLANT
PACK ANTERIOR HIP CUSTOM (KITS) ×2 IMPLANT
PENCIL SMOKE EVACUATOR (MISCELLANEOUS) IMPLANT
SAW OSC TIP CART 19.5X105X1.3 (SAW) ×2 IMPLANT
SEALER BIPOLAR AQUA 6.0 (INSTRUMENTS) ×2 IMPLANT
SET HNDPC FAN SPRY TIP SCT (DISPOSABLE) ×1 IMPLANT
STEM TRI LOC BPS SZ6 W GRIPTON IMPLANT
SUT ETHIBOND NAB CT1 #1 30IN (SUTURE) ×4 IMPLANT
SUT MNCRL AB 3-0 PS2 18 (SUTURE) ×2 IMPLANT
SUT MNCRL AB 4-0 PS2 18 (SUTURE) ×2 IMPLANT
SUT MON AB 2-0 CT1 36 (SUTURE) ×4 IMPLANT
SUT STRATAFIX PDO 1 14 VIOLET (SUTURE) ×2
SUT STRATFX PDO 1 14 VIOLET (SUTURE) ×1
SUT VIC AB 2-0 CT1 27 (SUTURE) ×2
SUT VIC AB 2-0 CT1 TAPERPNT 27 (SUTURE) ×1 IMPLANT
SUTURE STRATFX PDO 1 14 VIOLET (SUTURE) ×1 IMPLANT
SYR 3ML LL SCALE MARK (SYRINGE) ×2 IMPLANT
TRAY FOLEY MTR SLVR 16FR STAT (SET/KITS/TRAYS/PACK) IMPLANT
TRI LOC BPS SZ 6 W GRIPTON ×2 IMPLANT
TUBE SUCTION HIGH CAP CLEAR NV (SUCTIONS) ×2 IMPLANT
WATER STERILE IRR 1000ML POUR (IV SOLUTION) ×2 IMPLANT

## 2020-06-14 NOTE — Transfer of Care (Signed)
Immediate Anesthesia Transfer of Care Note  Patient: Steve Weaver  Procedure(s) Performed: TOTAL HIP ARTHROPLASTY ANTERIOR APPROACH (Left Hip)  Patient Location: PACU  Anesthesia Type:Spinal  Level of Consciousness: drowsy and patient cooperative  Airway & Oxygen Therapy: Patient Spontanous Breathing and Patient connected to face mask oxygen  Post-op Assessment: Report given to RN and Post -op Vital signs reviewed and stable  Post vital signs: Reviewed and stable  Last Vitals:  Vitals Value Taken Time  BP    Temp    Pulse 81 06/14/20 1129  Resp 17 06/14/20 1129  SpO2 100 % 06/14/20 1129  Vitals shown include unvalidated device data.  Last Pain:  Vitals:   06/14/20 0646  TempSrc:   PainSc: 0-No pain         Complications: No complications documented.

## 2020-06-14 NOTE — Op Note (Signed)
OPERATIVE REPORT  SURGEON: Rod Can, MD   ASSISTANT: Cherlynn June, PA-C.  PREOPERATIVE DIAGNOSIS: Left hip arthritis.   POSTOPERATIVE DIAGNOSIS: Left hip arthritis.   PROCEDURE: Left total hip arthroplasty, anterior approach.   IMPLANTS: DePuy Tri Lock stem, size 6, hi offset. DePuy Pinnacle Cup, size 58 mm. DePuy Altrx liner, size 36 by 58 mm, neutral. DePuy Biolox ceramic head ball, size 36 + 8.5 mm.  ANESTHESIA:  MAC and Spinal  ESTIMATED BLOOD LOSS:-100 mL    ANTIBIOTICS: 2g Ancef.  DRAINS: None.  COMPLICATIONS: None.   CONDITION: PACU - hemodynamically stable.   BRIEF CLINICAL NOTE: Steve Weaver is a 70 y.o. male with a long-standing history of Left hip arthritis. After failing conservative management, the patient was indicated for total hip arthroplasty. The risks, benefits, and alternatives to the procedure were explained, and the patient elected to proceed.  PROCEDURE IN DETAIL: Surgical site was marked by myself in the pre-op holding area. Once inside the operating room, spinal anesthesia was obtained, and a foley catheter was inserted. The patient was then positioned on the Hana table.  All bony prominences were well padded.  The hip was prepped and draped in the normal sterile surgical fashion.  A time-out was called verifying side and site of surgery. The patient received IV antibiotics within 60 minutes of beginning the procedure.   The direct anterior approach to the hip was performed through the Hueter interval.  Lateral femoral circumflex vessels were treated with the Auqumantys. The anterior capsule was exposed and an inverted T capsulotomy was made. The femoral neck cut was made to the level of the templated cut.  A corkscrew was placed into the head and the head was removed.  The femoral head was found to have eburnated bone. The head was passed to the back table and was measured.   Acetabular exposure was achieved, and the pulvinar and  labrum were excised. Sequential reaming of the acetabulum was then performed up to a size 57 mm reamer. A 58 mm cup was then opened and impacted into place at approximately 40 degrees of abduction and 20 degrees of anteversion. The final polyethylene liner was impacted into place and acetabular osteophytes were removed.    I then gained femoral exposure taking care to protect the abductors and greater trochanter.  This was performed using standard external rotation, extension, and adduction.  The capsule was peeled off the inner aspect of the greater trochanter, taking care to preserve the short external rotators. A cookie cutter was used to enter the femoral canal, and then the femoral canal finder was placed.  Sequential broaching was performed up to a size 6.  Calcar planer was used on the femoral neck remnant.  I placed a hi offset neck and a trial head ball.  The hip was reduced.  Leg lengths and offset were checked fluoroscopically.  The hip was dislocated and trial components were removed.  The final implants were placed, and the hip was reduced.  Fluoroscopy was used to confirm component position and leg lengths.  At 90 degrees of external rotation and full extension, the hip was stable to an anterior directed force.   The wound was copiously irrigated with Irrisept solution and normal saline using pule lavage.  Marcaine solution was injected into the periarticular soft tissue.  The wound was closed in layers using #1 Stratafix for the fascia, 2-0 Vicryl for the subcutaneous fat, 2-0 Monocryl for the deep dermal layer, 3-0 running Monocryl subcuticular  stitch, and Dermabond for the skin.  Once the glue was fully dried, an Aquacell Ag dressing was applied.  The patient was transported to the recovery room in stable condition.  Sponge, needle, and instrument counts were correct at the end of the case x2.  The patient tolerated the procedure well and there were no known complications.  Please note that a  surgical assistant was a medical necessity for this procedure to perform it in a safe and expeditious manner. Assistant was necessary to provide appropriate retraction of vital neurovascular structures, to prevent femoral fracture, and to allow for anatomic placement of the prosthesis.

## 2020-06-14 NOTE — Interval H&P Note (Signed)
History and Physical Interval Note:  06/14/2020 8:45 AM  Steve Weaver  has presented today for surgery, with the diagnosis of Degenerative joint disease left hip.  The various methods of treatment have been discussed with the patient and family. After consideration of risks, benefits and other options for treatment, the patient has consented to  Procedure(s): TOTAL HIP ARTHROPLASTY ANTERIOR APPROACH (Left) as a surgical intervention.  The patient's history has been reviewed, patient examined, no change in status, stable for surgery.  I have reviewed the patient's chart and labs.  Questions were answered to the patient's satisfaction.    The risks, benefits, and alternatives were discussed with the patient. There are risks associated with the surgery including, but not limited to, problems with anesthesia (death), infection, instability (giving out of the joint), dislocation, differences in leg length/angulation/rotation, fracture of bones, loosening or failure of implants, hematoma (blood accumulation) which may require surgical drainage, blood clots, pulmonary embolism, nerve injury (foot drop and lateral thigh numbness), and blood vessel injury. The patient understands these risks and elects to proceed.    Iline Oven Ladell Bey

## 2020-06-14 NOTE — Anesthesia Postprocedure Evaluation (Signed)
Anesthesia Post Note  Patient: Steve Weaver  Procedure(s) Performed: TOTAL HIP ARTHROPLASTY ANTERIOR APPROACH (Left Hip)     Patient location during evaluation: PACU Anesthesia Type: MAC and Spinal Level of consciousness: oriented and awake and alert Pain management: pain level controlled Vital Signs Assessment: post-procedure vital signs reviewed and stable Respiratory status: spontaneous breathing and respiratory function stable Cardiovascular status: blood pressure returned to baseline and stable Postop Assessment: no headache, no backache, no apparent nausea or vomiting and spinal receding Anesthetic complications: no   No complications documented.  Last Vitals:  Vitals:   06/14/20 1230 06/14/20 1306  BP: 139/72 (!) 147/71  Pulse: (!) 56 84  Resp: (!) 23   Temp: 36.4 C 36.4 C  SpO2: 99% 98%    Last Pain:  Vitals:   06/14/20 1306  TempSrc: Oral  PainSc:                  Pervis Hocking

## 2020-06-14 NOTE — Discharge Instructions (Signed)
°Dr. Humna Moorehouse °Joint Replacement Specialist °[facility] °[address] °[place], [place] [address] °[phone] ° ° °TOTAL HIP REPLACEMENT POSTOPERATIVE DIRECTIONS ° ° ° °Hip Rehabilitation, Guidelines Following Surgery  ° °WEIGHT BEARING °Weight bearing as tolerated with assist device (walker, cane, etc) as directed, use it as long as suggested by your surgeon or therapist, typically at least 4-6 weeks. ° °The results of a hip operation are greatly improved after range of motion and muscle strengthening exercises. Follow all safety measures which are given to protect your hip. If any of these exercises cause increased pain or swelling in your joint, decrease the amount until you are comfortable again. Then slowly increase the exercises. Call your caregiver if you have problems or questions.  ° °HOME CARE INSTRUCTIONS  °Most of the following instructions are designed to prevent the dislocation of your new hip.  °Remove items at home which could result in a fall. This includes throw rugs or furniture in walking pathways.  °Continue medications as instructed at time of discharge. °· You may have some home medications which will be placed on hold until you complete the course of blood thinner medication. °· You may start showering once you are discharged home. Do not remove your dressing. °Do not put on socks or shoes without following the instructions of your caregivers.   °Sit on chairs with arms. Use the chair arms to help push yourself up when arising.  °Arrange for the use of a toilet seat elevator so you are not sitting low.  °· Walk with walker as instructed.  °You may resume a sexual relationship in one month or when given the OK by your caregiver.  °Use walker as long as suggested by your caregivers.  °You may put full weight on your legs and walk as much as is comfortable. °Avoid periods of inactivity such as sitting longer than an hour when not asleep. This helps prevent  blood clots.  °You may return to work once you are cleared by your surgeon.  °Do not drive a car for 6 weeks or until released by your surgeon.  °Do not drive while taking narcotics.  °Wear elastic stockings for two weeks following surgery during the day but you may remove then at night.  °Make sure you keep all of your appointments after your operation with all of your doctors and caregivers. You should call the office at the above phone number and make an appointment for approximately two weeks after the date of your surgery. °Please pick up a stool softener and laxative for home use as long as you are requiring pain medications. °· ICE to the affected hip every three hours for 30 minutes at a time and then as needed for pain and swelling. Continue to use ice on the hip for pain and swelling from surgery. You may notice swelling that will progress down to the foot and ankle.  This is normal after surgery.  Elevate the leg when you are not up walking on it.   °It is important for you to complete the blood thinner medication as prescribed by your doctor. °· Continue to use the breathing machine which will help keep your temperature down.  It is common for your temperature to cycle up and down following surgery, especially at night when you are not up moving around and exerting yourself.  The breathing machine keeps your lungs expanded and your temperature down. ° °RANGE OF MOTION AND STRENGTHENING EXERCISES  °These exercises are   designed to help you keep full movement of your hip joint. Follow your caregiver's or physical therapist's instructions. Perform all exercises about fifteen times, three times per day or as directed. Exercise both hips, even if you have had only one joint replacement. These exercises can be done on a training (exercise) mat, on the floor, on a table or on a bed. Use whatever works the best and is most comfortable for you. Use music or television while you are exercising so that the exercises  are a pleasant break in your day. This will make your life better with the exercises acting as a break in routine you can look forward to.  °Lying on your back, slowly slide your foot toward your buttocks, raising your knee up off the floor. Then slowly slide your foot back down until your leg is straight again.  °Lying on your back spread your legs as far apart as you can without causing discomfort.  °Lying on your side, raise your upper leg and foot straight up from the floor as far as is comfortable. Slowly lower the leg and repeat.  °Lying on your back, tighten up the muscle in the front of your thigh (quadriceps muscles). You can do this by keeping your leg straight and trying to raise your heel off the floor. This helps strengthen the largest muscle supporting your knee.  °Lying on your back, tighten up the muscles of your buttocks both with the legs straight and with the knee bent at a comfortable angle while keeping your heel on the floor.  ° °SKILLED REHAB INSTRUCTIONS: °If the patient is transferred to a skilled rehab facility following release from the hospital, a list of the current medications will be sent to the facility for the patient to continue.  When discharged from the skilled rehab facility, please have the facility set up the patient's Home Health Physical Therapy prior to being released. Also, the skilled facility will be responsible for providing the patient with their medications at time of release from the facility to include their pain medication and their blood thinner medication. If the patient is still at the rehab facility at time of the two week follow up appointment, the skilled rehab facility will also need to assist the patient in arranging follow up appointment in our office and any transportation needs. ° °MAKE SURE YOU:  °Understand these instructions.  °Will watch your condition.  °Will get help right away if you are not doing well or get worse. ° °Pick up stool softner and  laxative for home use following surgery while on pain medications. °Do not remove your dressing. °The dressing is waterproof--it is OK to take showers. °Continue to use ice for pain and swelling after surgery. °Do not use any lotions or creams on the incision until instructed by your surgeon. °Total Hip Protocol. ° ° °

## 2020-06-14 NOTE — Anesthesia Procedure Notes (Signed)
Spinal  Patient location during procedure: OR Start time: 06/14/2020 9:57 AM Staffing Performed: resident/CRNA  Anesthesiologist: Pervis Hocking, DO Resident/CRNA: Eben Burow, CRNA Preanesthetic Checklist Completed: patient identified, IV checked, site marked, risks and benefits discussed, surgical consent, monitors and equipment checked, pre-op evaluation and timeout performed Spinal Block Patient position: sitting Prep: DuraPrep and site prepped and draped Patient monitoring: heart rate, cardiac monitor, continuous pulse ox and blood pressure Approach: midline Location: L3-4 Injection technique: single-shot Needle Needle type: Pencan  Needle gauge: 24 G Needle length: 9 cm Assessment Sensory level: T4 Additional Notes Pt placed in sitting position, spinal kit expiration date checked and verified, + CSF, - heme, pt tolerated well. Dr Doroteo Glassman present and supervising throughout [place].

## 2020-06-14 NOTE — Evaluation (Signed)
Physical Therapy Evaluation Patient Details Name: Steve Weaver MRN: [id number] DOB: 09-24-1950 Today's Date: 06/14/2020   History of Present Illness  Patient is 70 y.o. male s/p Lt THA anterior approach on 06/14/20 with PMH significant for HTN, HLD, OA, A-fib/  Clinical Impression  Jag Lenz is a 70 y.o. male POD 0 s/p Lt THA. Patient reports independence with mobility at baseline. Patient is now limited by functional impairments (see PT problem list below) and requires min assist for transfers and gait with RW. Patient was able to ambulate ~80 feet with RW and min assist. Patient instructed in exercise to facilitate ROM/strength and circulation. Patient will benefit from continued skilled PT interventions to address impairments and progress towards PLOF. Acute PT will follow to progress mobility and stair training in preparation for safe discharge home.     Follow Up Recommendations Follow surgeon's recommendation for DC plan and follow-up therapies    Equipment Recommendations  Rolling walker with 5" wheels;3in1 (PT)    Recommendations for Other Services       Precautions / Restrictions Precautions Precautions: Fall Restrictions Weight Bearing Restrictions: No Other Position/Activity Restrictions: WBAT      Mobility  Bed Mobility Overal bed mobility: Needs Assistance Bed Mobility: Supine to Sit     Supine to sit: Min assist;HOB elevated     General bed mobility comments: assist for Lt LE mobility to EOB. Cues to use bed rail to raise trunk up.    Transfers Overall transfer level: Needs assistance Equipment used: Rolling walker (2 wheeled) Transfers: Sit to/from Stand Sit to Stand: Min assist;From elevated surface         General transfer comment: EOB slightly elevated, cues for technique with RW, assist to complete rise and steady standing.  Ambulation/Gait Ambulation/Gait assistance: Min assist Gait Distance (Feet): 80 Feet Assistive device: Rolling  walker (2 wheeled) Gait Pattern/deviations: Step-to pattern;Step-through pattern;Decreased stride length;Decreased weight shift to left Gait velocity: fair   General Gait Details: cues for step pattern and pt progressed to step through pattern, min assist required to manage walker proximity and steady through turns.  Stairs            Wheelchair Mobility    Modified Rankin (Stroke Patients Only)       Balance Overall balance assessment: Needs assistance Sitting-balance support: Feet supported Sitting balance-Leahy Scale: Good     Standing balance support: During functional activity;Bilateral upper extremity supported Standing balance-Leahy Scale: Poor                               Pertinent Vitals/Pain Pain Assessment: 0-10 Pain Score: 1  Pain Location: Lt hip Pain Descriptors / Indicators: Burning;Aching;Discomfort Pain Intervention(s): Limited activity within patient's tolerance;Monitored during session;Repositioned    Home Living Family/patient expects to be discharged to:: Private residence Living Arrangements: Spouse/significant other Available Help at Discharge: Family Type of Home: House Home Access: Stairs to enter Entrance Stairs-Rails: Left Entrance Stairs-Number of Steps: 2 Home Layout: Two level;Bed/bath upstairs Home Equipment: None      Prior Function Level of Independence: Independent         Comments: works part time at [employer]. 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     Hand Dominance   Dominant Hand: Right    Extremity/Trunk Assessment   Upper Extremity Assessment Upper Extremity Assessment: Overall WFL for tasks assessed    Lower Extremity Assessment Lower Extremity Assessment:  Overall [facility] for tasks assessed    Cervical / Trunk Assessment Cervical / Trunk Assessment: Normal  Communication   Communication: No difficulties  Cognition Arousal/Alertness: Awake/alert Behavior During Therapy: WFL for tasks  assessed/performed Overall Cognitive Status: Within Functional Limits for tasks assessed                                        General Comments      Exercises Total Joint Exercises Ankle Circles/Pumps: AROM;Both;20 reps;Seated Quad Sets: AROM;Left;5 reps;Seated Heel Slides: AROM;Left;5 reps;Seated   Assessment/Plan    PT Assessment Patient needs continued PT services  PT Problem List Decreased strength;Decreased range of motion;Decreased activity tolerance;Decreased balance;Decreased mobility;Decreased knowledge of use of DME;Decreased knowledge of precautions       PT Treatment Interventions DME instruction;Gait training;Stair training;Functional mobility training;Therapeutic activities;Therapeutic exercise;Neuromuscular re-education;Patient/family education;Balance training    PT Goals (Current goals can be found in the Care Plan section)  Acute Rehab PT Goals Patient Stated Goal: regain independence PT Goal Formulation: With patient Time For Goal Achievement: 06/21/20 Potential to Achieve Goals: Good    Frequency 7X/week   Barriers to discharge        Co-evaluation               AM-PAC PT "6 Clicks" Mobility  Outcome Measure Help needed turning from your back to your side while in a flat bed without using bedrails?: A Little Help needed moving from lying on your back to sitting on the side of a flat bed without using bedrails?: A Little Help needed moving to and from a bed to a chair (including a wheelchair)?: A Little Help needed standing up from a chair using your arms (e.g., wheelchair or bedside chair)?: A Little Help needed to walk in hospital room?: A Little Help needed climbing 3-5 steps with a railing? : A Little 6 Click Score: 18    End of Session Equipment Utilized During Treatment: Gait belt Activity Tolerance: Patient tolerated treatment well Patient left: in chair;with chair alarm set;with call bell/phone within reach;with  family/visitor present Nurse Communication: Mobility status PT Visit Diagnosis: Muscle weakness (generalized) (M62.81);Difficulty in walking, not elsewhere classified (R26.2)    Time: [id number] PT Time Calculation (min) (ACUTE ONLY): 23 min   Charges:   PT Evaluation $PT Eval Low Complexity: 1 Low PT Treatments $Gait Training: 8-22 mins        Verner Mould, DPT Acute Rehabilitation Services Office [phone] Pager [phone]    Jacques Navy 06/14/2020, 4:01 PM

## 2020-06-14 NOTE — Anesthesia Procedure Notes (Signed)
Procedure Name: MAC Date/Time: 06/14/2020 8:51 AM Performed by: Eben Burow, CRNA Pre-anesthesia Checklist: Patient identified, Emergency Drugs available, Suction available, Patient being monitored and Timeout performed Oxygen Delivery Method: Simple face mask Placement Confirmation: positive ETCO2

## 2020-06-15 ENCOUNTER — Encounter (HOSPITAL_COMMUNITY): Payer: Self-pay | Admitting: Orthopedic Surgery

## 2020-06-15 DIAGNOSIS — M1612 Unilateral primary osteoarthritis, left hip: Secondary | ICD-10-CM | POA: Diagnosis not present

## 2020-06-15 LAB — CBC
HCT: 39.3 % (ref 39.0–52.0)
Hemoglobin: 14.1 g/dL (ref 13.0–17.0)
MCH: 34.4 pg — ABNORMAL HIGH (ref 26.0–34.0)
MCHC: 35.9 g/dL (ref 30.0–36.0)
MCV: 95.9 fL (ref 80.0–100.0)
Platelets: 210 10*3/uL (ref 150–400)
RBC: 4.1 MIL/uL — ABNORMAL LOW (ref 4.22–5.81)
RDW: 12.2 % (ref 11.5–15.5)
WBC: 14.7 10*3/uL — ABNORMAL HIGH (ref 4.0–10.5)
nRBC: 0 % (ref 0.0–0.2)

## 2020-06-15 LAB — BASIC METABOLIC PANEL
Anion gap: 11 (ref 5–15)
BUN: 18 mg/dL (ref 8–23)
CO2: 23 mmol/L (ref 22–32)
Calcium: 8.5 mg/dL — ABNORMAL LOW (ref 8.9–10.3)
Chloride: 104 mmol/L (ref 98–111)
Creatinine, Ser: 1.3 mg/dL — ABNORMAL HIGH (ref 0.61–1.24)
GFR, Estimated: 59 mL/min — ABNORMAL LOW (ref >60)
Glucose, Bld: 156 mg/dL — ABNORMAL HIGH (ref 70–99)
Potassium: 3.7 mmol/L (ref 3.5–5.1)
Sodium: 138 mmol/L (ref 135–145)

## 2020-06-15 MED ORDER — SENNA 8.6 MG PO TABS: 2.0000 | ORAL_TABLET | Freq: Every day | ORAL | 1 refills | Status: AC

## 2020-06-15 MED ORDER — ONDANSETRON HCL 4 MG PO TABS: 4.0000 mg | ORAL_TABLET | Freq: Four times a day (QID) | ORAL | 0 refills | Status: DC | PRN

## 2020-06-15 MED ORDER — HYDROCODONE-ACETAMINOPHEN 5-325 MG PO TABS: 1.0000 | ORAL_TABLET | ORAL | 0 refills | Status: DC | PRN

## 2020-06-15 MED ORDER — DOCUSATE SODIUM 100 MG PO CAPS: 100.0000 mg | ORAL_CAPSULE | Freq: Two times a day (BID) | ORAL | 1 refills | Status: DC

## 2020-06-15 NOTE — Discharge Summary (Signed)
Physician Discharge Summary  Patient ID: Steve Weaver MRN: [id number] DOB/AGE: [birth year] 70 y.o.  Admit date: 06/14/2020 Discharge date: 06/15/2020  Admission Diagnoses:  Osteoarthritis of left hip  Discharge Diagnoses:  Principal Problem:   Osteoarthritis of left hip   Past Medical History:  Diagnosis Date  . Arthritis   . Dysrhythmia    afib  . Hyperlipidemia   . Hypertension   . Longstanding persistent atrial fibrillation (HCC)     Surgeries: Procedure(s): TOTAL HIP ARTHROPLASTY ANTERIOR APPROACH on 06/14/2020   Consultants (if any):   Discharged Condition: Improved  Hospital Course: Giannis Corpuz is an 70 y.o. male who was admitted 06/14/2020 with a diagnosis of Osteoarthritis of left hip and went to the operating room on 06/14/2020 and underwent the above named procedures.    He was given perioperative antibiotics:  Anti-infectives (From admission, onward)   Start     Dose/Rate Route Frequency Ordered Stop   06/14/20 1500  ceFAZolin (ANCEF) IVPB 2g/100 mL premix        2 g 200 mL/hr over 30 Minutes Intravenous Every 6 hours 06/14/20 1119 06/14/20 2138   06/14/20 0630  ceFAZolin (ANCEF) IVPB 2g/100 mL premix        2 g 200 mL/hr over 30 Minutes Intravenous On call to O.R. 06/14/20 0539 06/14/20 0931    .  He was given sequential compression devices, early ambulation, and Eliquis for DVT prophylaxis.  He benefited maximally from the hospital stay and there were no complications.    Creatinine was mildly elevated, however urine output was excellent. Plan for follow up with PCP in 1 week.  Recent vital signs:  Vitals:   06/15/20 0157 06/15/20 0633  BP: 128/66 137/82  Pulse: 84 85  Resp: 16 16  Temp: 98.2 F (36.8 C) 98.6 F (37 C)  SpO2: 96% 96%    Recent laboratory studies:  Lab Results  Component Value Date   HGB 14.1 06/15/2020   HGB 17.2 (H) 06/06/2020   HGB 16.7 09/07/2015   Lab Results  Component Value Date   WBC 14.7 (H) 06/15/2020    PLT 210 06/15/2020   Lab Results  Component Value Date   INR 1.1 06/06/2020   Lab Results  Component Value Date   NA 138 06/15/2020   K 3.7 06/15/2020   CL 104 06/15/2020   CO2 23 06/15/2020   BUN 18 06/15/2020   CREATININE 1.30 (H) 06/15/2020   GLUCOSE 156 (H) 06/15/2020    Discharge Medications:   Allergies as of 06/15/2020      Reactions   Other Rash   Muscle Relaxer (unsure of name)      Medication List    TAKE these medications   chlorthalidone 25 MG tablet Commonly known as: HYGROTON TAKE 1/2 TABLET BY MOUTH EVERY DAY   CO Q 10 PO Take 300 mg by mouth daily.   diltiazem 120 MG 24 hr capsule Commonly known as: CARDIZEM CD TAKE 1 CAPSULE BY MOUTH EVERY DAY What changed:   how much to take  additional instructions   docusate sodium 100 MG capsule Commonly known as: COLACE Take 1 capsule (100 mg total) by mouth 2 (two) times daily.   Eliquis 5 MG Tabs tablet Generic drug: apixaban TAKE 1 TABLET BY MOUTH TWICE A DAY What changed: how much to take   HYDROcodone-acetaminophen 5-325 MG tablet Commonly known as: NORCO/VICODIN Take 1-2 tablets by mouth every 4 (four) hours as needed for moderate pain (pain  score 4-6).   lovastatin 40 MG tablet Commonly known as: MEVACOR Take 1 tablet (40 mg total) by mouth daily.   metoprolol tartrate 25 MG tablet Commonly known as: LOPRESSOR TAKE 1 & 1/2 TABLETS BY MOUTH TWICE A DAY What changed: See the new instructions.   multivitamin capsule Take 1 capsule by mouth daily.   Omega-3 Fish Oil 1200 MG Caps Take 1,200 mg by mouth 2 (two) times daily.   ondansetron 4 MG tablet Commonly known as: ZOFRAN Take 1 tablet (4 mg total) by mouth every 6 (six) hours as needed for nausea.   senna 8.6 MG Tabs tablet Commonly known as: SENOKOT Take 2 tablets (17.2 mg total) by mouth at bedtime.   Vitamin D 50 MCG (2000 UT) tablet Take 2,000 Units by mouth daily.   zinc gluconate 50 MG tablet Take 50 mg by mouth daily.        Diagnostic Studies: DG Pelvis Portable  Result Date: 06/14/2020 CLINICAL DATA:  Status post left hip replacement. EXAM: PORTABLE PELVIS 1-2 VIEWS COMPARISON:  Fluoroscopic images of same day. FINDINGS: The left femoral and acetabular components are well situated. Expected postoperative changes are seen in the surrounding soft tissues. IMPRESSION: Status post left total hip arthroplasty. Electronically Signed   By: Marijo Conception M.D.   On: 06/14/2020 12:11   DG C-Arm 1-60 Min-No Report  Result Date: 06/14/2020 Fluoroscopy was utilized by the requesting physician.  No radiographic interpretation.   DG HIP OPERATIVE UNILAT W OR W/O PELVIS LEFT  Result Date: 06/14/2020 CLINICAL DATA:  Left hip surgery. EXAM: OPERATIVE LEFT HIP (WITH PELVIS IF PERFORMED) 4 VIEWS TECHNIQUE: Fluoroscopic spot image(s) were submitted for interpretation post-operatively. COMPARISON:  No prior. FINDINGS: Total left hip replacement.  Hardware intact.  Anatomic alignment. IMPRESSION: Total left hip replacement.  Anatomic alignment. Electronically Signed   By: Marcello Moores  Register   On: 06/14/2020 10:53    Disposition: Discharge disposition: 01-Home or Self Care       Discharge Instructions    Call MD / Call 911   Complete by: As directed    If you experience chest pain or shortness of breath, CALL 911 and be transported to the hospital emergency room.  If you develope a fever above 101 F, pus (white drainage) or increased drainage or redness at the wound, or calf pain, call your surgeon's office.   Constipation Prevention   Complete by: As directed    Drink plenty of fluids.  Prune juice may be helpful.  You may use a stool softener, such as Colace (over the counter) 100 mg twice a day.  Use MiraLax (over the counter) for constipation as needed.   Diet - low sodium heart healthy   Complete by: As directed    Driving restrictions   Complete by: As directed    No driving for 2-4 weeks   Increase activity slowly  as tolerated   Complete by: As directed    Lifting restrictions   Complete by: As directed    No lifting for 6 weeks   TED hose   Complete by: As directed    Use stockings (TED hose) for 2 weeks on both leg(s).  You may remove them at night for sleeping.       Follow-up Information    Maleka Contino, Aaron Edelman, MD. Schedule an appointment as soon as possible for a visit in 2 weeks.   Specialty: Orthopedic Surgery Why: For wound re-check Contact information: [place]  [place] [address] [phone]                Signed: Hilton Cork Rexine Gowens 06/15/2020, 10:18 AM

## 2020-06-15 NOTE — Progress Notes (Signed)
Physical Therapy Treatment Patient Details Name: Steve Weaver MRN: [id number] DOB: May 30, 1951 Today's Date: 06/15/2020    History of Present Illness Patient is 70 y.o. male s/p Lt THA anterior approach on 06/14/20 with PMH significant for HTN, HLD, OA, A-fib/    PT Comments    Pt up to ambulate increased distance and with improvement in stability but with cues to slow for safety.  Pt negotiated stairs and reviewed car transfers.  Pt very eager for dc home.   Follow Up Recommendations  Follow surgeon's recommendation for DC plan and follow-up therapies     Equipment Recommendations  Rolling walker with 5" wheels;3in1 (PT)    Recommendations for Other Services       Precautions / Restrictions Precautions Precautions: Fall Restrictions Weight Bearing Restrictions: No Other Position/Activity Restrictions: WBAT    Mobility  Bed Mobility               General bed mobility comments: Pt in chair and reports no difficulty with OOB this am  Transfers Overall transfer level: Needs assistance Equipment used: Rolling walker (2 wheeled) Transfers: Sit to/from Stand Sit to Stand: Min guard;Supervision         General transfer comment: cues for LE management and use of UEs to self assist.  Repeated x 3  Ambulation/Gait Ambulation/Gait assistance: Min guard;Supervision Gait Distance (Feet): 270 Feet Assistive device: Rolling walker (2 wheeled) Gait Pattern/deviations: Step-to pattern;Step-through pattern;Decreased stride length;Decreased weight shift to left;Trunk flexed;Shuffle     General Gait Details: cues for posture, position from RW, initial sequence and to slow pace for safety   Stairs Stairs: Yes Stairs assistance: Min guard Stair Management: One rail Left;Step to pattern;Sideways;Forwards;With cane Number of Stairs: 12 General stair comments: 4 stairs with L rail and both hands on rail; 12 stairs with rail and cane; cues for sequence and foot/cane  placement   Wheelchair Mobility    Modified Rankin (Stroke Patients Only)       Balance Overall balance assessment: Needs assistance Sitting-balance support: Feet supported Sitting balance-Leahy Scale: Good     Standing balance support: During functional activity;Bilateral upper extremity supported Standing balance-Leahy Scale: Fair                              Cognition Arousal/Alertness: Awake/alert Behavior During Therapy: WFL for tasks assessed/performed Overall Cognitive Status: Within Functional Limits for tasks assessed                                        Exercises Total Joint Exercises Ankle Circles/Pumps: AROM;Both;20 reps;Seated Quad Sets: AROM;Left;10 reps;Supine Heel Slides: 5 reps;AAROM;AROM;Left;20 reps;Supine Hip ABduction/ADduction: AAROM;AROM;Left;15 reps;Supine Long Arc Quad: AROM;Left;10 reps;Seated    General Comments        Pertinent Vitals/Pain Pain Assessment: 0-10 Pain Score: 1  Pain Location: Lt hip Pain Descriptors / Indicators: Discomfort Pain Intervention(s): Limited activity within patient's tolerance;Monitored during session;Ice applied    Home Living                      Prior Function            PT Goals (current goals can now be found in the care plan section) Acute Rehab PT Goals Patient Stated Goal: regain independence PT Goal Formulation: With patient Time For Goal Achievement: 06/21/20 Potential to Achieve Goals: Good  Progress towards PT goals: Progressing toward goals    Frequency    7X/week      PT Plan Current plan remains appropriate    Co-evaluation              AM-PAC PT "6 Clicks" Mobility   Outcome Measure  Help needed turning from your back to your side while in a flat bed without using bedrails?: A Little Help needed moving from lying on your back to sitting on the side of a flat bed without using bedrails?: A Little Help needed moving to and from a  bed to a chair (including a wheelchair)?: A Little Help needed standing up from a chair using your arms (e.g., wheelchair or bedside chair)?: A Little Help needed to walk in hospital room?: A Little Help needed climbing 3-5 steps with a railing? : A Little 6 Click Score: 18    End of Session Equipment Utilized During Treatment: Gait belt Activity Tolerance: Patient tolerated treatment well Patient left: in chair;with chair alarm set;with call bell/phone within reach Nurse Communication: Mobility status PT Visit Diagnosis: Muscle weakness (generalized) (M62.81);Difficulty in walking, not elsewhere classified (R26.2)     Time: [id number] PT Time Calculation (min) (ACUTE ONLY): 25 min  Charges:  $Gait Training: 8-22 mins $Therapeutic Exercise: 23-37 mins $Therapeutic Activity: 8-22 mins                     Debe Coder PT Acute Rehabilitation Services Pager [phone] Office [phone]    Steve Weaver 06/15/2020, 1:09 PM

## 2020-06-15 NOTE — TOC Transition Note (Signed)
Transition of Care [facility]) - CM/SW Discharge Note   Patient Details  Name: Steve Weaver MRN: [id number] Date of Birth: 05-31-1951  Transition of Care [facility]) CM/SW Contact:  Clearance Coots, LCSW Phone Number: 06/15/2020, 12:48 PM   Clinical Narrative:    [employer]  Delivered a RW to the patient bedside. Patient declined 3 in1.    Final next level of care: Home/Self Care Barriers to Discharge: No Barriers Identified   Patient Goals and CMS Choice        Discharge Placement                       Discharge Plan and Services                DME Arranged: Walker rolling DME Agency: [employer] Date DME Agency Contacted: 06/15/20 Time DME Agency Contacted: [id number] Representative spoke with at DME Agency: Harrold Donath            Social Determinants of Health (SDOH) Interventions     Readmission Risk Interventions No flowsheet data found.

## 2020-06-15 NOTE — Plan of Care (Signed)
  Problem: Education: Goal: Knowledge of the prescribed therapeutic regimen will improve 06/15/2020 0258 by Ardyth Harps, RN Outcome: Progressing 06/15/2020 0257 by Ardyth Harps, RN Outcome: Progressing 06/15/2020 0025 by Ardyth Harps, RN Outcome: Progressing Goal: Understanding of discharge needs will improve 06/15/2020 0258 by Ardyth Harps, RN Outcome: Progressing 06/15/2020 0257 by Ardyth Harps, RN Outcome: Progressing 06/15/2020 0025 by Ardyth Harps, RN Outcome: Progressing Goal: Individualized Educational Video(s) 06/15/2020 0258 by Ardyth Harps, RN Outcome: Progressing 06/15/2020 0257 by Ardyth Harps, RN Outcome: Progressing 06/15/2020 0025 by Ardyth Harps, RN Outcome: Progressing   Problem: Activity: Goal: Ability to avoid complications of mobility impairment will improve 06/15/2020 0258 by Ardyth Harps, RN Outcome: Progressing 06/15/2020 0257 by Ardyth Harps, RN Outcome: Progressing 06/15/2020 0025 by Ardyth Harps, RN Outcome: Progressing Goal: Ability to tolerate increased activity will improve 06/15/2020 0258 by Ardyth Harps, RN Outcome: Progressing 06/15/2020 0257 by Ardyth Harps, RN Outcome: Progressing 06/15/2020 0025 by Ardyth Harps, RN Outcome: Progressing   Problem: Clinical Measurements: Goal: Postoperative complications will be avoided or minimized 06/15/2020 0258 by Ardyth Harps, RN Outcome: Progressing 06/15/2020 0257 by Ardyth Harps, RN Outcome: Progressing 06/15/2020 0025 by Ardyth Harps, RN Outcome: Progressing   Problem: Pain Management: Goal: Pain level will decrease with appropriate interventions 06/15/2020 0258 by Ardyth Harps, RN Outcome: Progressing 06/15/2020 0257 by Ardyth Harps, RN Outcome: Progressing 06/15/2020 0025 by Ardyth Harps, RN Outcome: Progressing   Problem: Skin Integrity: Goal: Will show signs of wound healing 06/15/2020 0258 by Ardyth Harps, RN Outcome: Progressing 06/15/2020 0257 by Ardyth Harps, RN Outcome: Progressing 06/15/2020 0025 by Ardyth Harps, RN Outcome: Progressing    Problem: Education: Goal: Knowledge of the prescribed therapeutic regimen will improve 06/15/2020 0258 by Ardyth Harps, RN Outcome: Progressing 06/15/2020 0257 by Ardyth Harps, RN Outcome: Progressing Goal: Understanding of discharge needs will improve 06/15/2020 0258 by Ardyth Harps, RN Outcome: Progressing 06/15/2020 0257 by Ardyth Harps, RN Outcome: Progressing Goal: Individualized Educational Video(s) 06/15/2020 0258 by Ardyth Harps, RN Outcome: Progressing 06/15/2020 0257 by Ardyth Harps, RN Outcome: Progressing   Problem: Activity: Goal: Ability to avoid complications of mobility impairment will improve 06/15/2020 0258 by Ardyth Harps, RN Outcome: Progressing 06/15/2020 0257 by Ardyth Harps, RN Outcome: Progressing Goal: Ability to tolerate increased activity will improve 06/15/2020 0258 by Ardyth Harps, RN Outcome: Progressing 06/15/2020 0257 by Ardyth Harps, RN Outcome: Progressing   Problem: Clinical Measurements: Goal: Postoperative complications will be avoided or minimized 06/15/2020 0258 by Ardyth Harps, RN Outcome: Progressing 06/15/2020 0257 by Ardyth Harps, RN Outcome: Progressing   Problem: Pain Management: Goal: Pain level will decrease with appropriate interventions 06/15/2020 0258 by Ardyth Harps, RN Outcome: Progressing 06/15/2020 0257 by Ardyth Harps, RN Outcome: Progressing   Problem: Skin Integrity: Goal: Will show signs of wound healing 06/15/2020 0258 by Ardyth Harps, RN Outcome: Progressing 06/15/2020 0257 by Ardyth Harps, RN Outcome: Progressing

## 2020-06-15 NOTE — Plan of Care (Signed)
Problem: Education: Goal: Knowledge of the prescribed therapeutic regimen will improve 06/15/2020 1024 by Myson Levi, Helane Gunther, RN Outcome: Adequate for Discharge 06/15/2020 1024 by Deetta Perla, RN Outcome: Progressing 06/15/2020 1022 by Deetta Perla, RN Outcome: Progressing Goal: Understanding of discharge needs will improve 06/15/2020 1024 by Evin Chirco, Helane Gunther, RN Outcome: Adequate for Discharge 06/15/2020 1024 by Deetta Perla, RN Outcome: Progressing 06/15/2020 1022 by Deetta Perla, RN Outcome: Progressing Goal: Individualized Educational Video(s) 06/15/2020 1024 by Deetta Perla, RN Outcome: Adequate for Discharge 06/15/2020 1024 by Deetta Perla, RN Outcome: Progressing 06/15/2020 1022 by Deetta Perla, RN Outcome: Progressing   Problem: Activity: Goal: Ability to avoid complications of mobility impairment will improve 06/15/2020 1024 by Ambry Dix, Helane Gunther, RN Outcome: Adequate for Discharge 06/15/2020 1024 by Deetta Perla, RN Outcome: Progressing 06/15/2020 1022 by Deetta Perla, RN Outcome: Progressing Goal: Ability to tolerate increased activity will improve 06/15/2020 1024 by Luticia Tadros, Helane Gunther, RN Outcome: Adequate for Discharge 06/15/2020 1024 by Deetta Perla, RN Outcome: Progressing 06/15/2020 1022 by Deetta Perla, RN Outcome: Progressing   Problem: Clinical Measurements: Goal: Postoperative complications will be avoided or minimized 06/15/2020 1024 by Deetta Perla, RN Outcome: Adequate for Discharge 06/15/2020 1024 by Deetta Perla, RN Outcome: Progressing 06/15/2020 1022 by Deetta Perla, RN Outcome: Progressing   Problem: Pain Management: Goal: Pain level will decrease with appropriate interventions 06/15/2020 1024 by Deetta Perla, RN Outcome: Adequate for Discharge 06/15/2020 1024 by Deetta Perla, RN Outcome: Progressing 06/15/2020 1022 by Deetta Perla, RN Outcome: Progressing   Problem: Skin Integrity: Goal:  Will show signs of wound healing 06/15/2020 1024 by Deetta Perla, RN Outcome: Adequate for Discharge 06/15/2020 1024 by Deetta Perla, RN Outcome: Progressing 06/15/2020 1022 by Deetta Perla, RN Outcome: Progressing   Problem: Education: Goal: Knowledge of the prescribed therapeutic regimen will improve 06/15/2020 1024 by Deetta Perla, RN Outcome: Adequate for Discharge 06/15/2020 1024 by Deetta Perla, RN Outcome: Progressing 06/15/2020 1022 by Deetta Perla, RN Outcome: Progressing Goal: Understanding of discharge needs will improve 06/15/2020 1024 by Deetta Perla, RN Outcome: Adequate for Discharge 06/15/2020 1024 by Deetta Perla, RN Outcome: Progressing 06/15/2020 1022 by Deetta Perla, RN Outcome: Progressing Goal: Individualized Educational Video(s) 06/15/2020 1024 by Deetta Perla, RN Outcome: Adequate for Discharge 06/15/2020 1024 by Deetta Perla, RN Outcome: Progressing 06/15/2020 1022 by Deetta Perla, RN Outcome: Progressing   Problem: Activity: Goal: Ability to avoid complications of mobility impairment will improve 06/15/2020 1024 by Naziah Weckerly, Helane Gunther, RN Outcome: Adequate for Discharge 06/15/2020 1024 by Deetta Perla, RN Outcome: Progressing 06/15/2020 1022 by Deetta Perla, RN Outcome: Progressing Goal: Ability to tolerate increased activity will improve 06/15/2020 1024 by Edwina Grossberg, Helane Gunther, RN Outcome: Adequate for Discharge 06/15/2020 1024 by Deetta Perla, RN Outcome: Progressing 06/15/2020 1022 by Deetta Perla, RN Outcome: Progressing   Problem: Clinical Measurements: Goal: Postoperative complications will be avoided or minimized 06/15/2020 1024 by Deetta Perla, RN Outcome: Adequate for Discharge 06/15/2020 1024 by Deetta Perla, RN Outcome: Progressing 06/15/2020 1022 by Deetta Perla, RN Outcome: Progressing   Problem: Pain Management: Goal: Pain level will decrease with appropriate  interventions 06/15/2020 1024 by Deetta Perla, RN Outcome: Adequate for Discharge 06/15/2020 1024 by Deetta Perla, RN Outcome: Progressing 06/15/2020 1022 by Deetta Perla, RN Outcome: Progressing   Problem: Skin Integrity: Goal: Will show signs  of wound healing 06/15/2020 1024 by Winford Hehn, Helane Gunther, RN Outcome: Adequate for Discharge 06/15/2020 1024 by Deetta Perla, RN Outcome: Progressing 06/15/2020 1022 by Burdette Forehand, Helane Gunther, RN Outcome: Progressing   Problem: Education: Goal: Knowledge of General Education information will improve Description: Including pain rating scale, medication(s)/side effects and non-pharmacologic comfort measures 06/15/2020 1024 by Deetta Perla, RN Outcome: Adequate for Discharge 06/15/2020 1024 by Deetta Perla, RN Outcome: Progressing 06/15/2020 1022 by Deetta Perla, RN Outcome: Progressing   Problem: Health Behavior/Discharge Planning: Goal: Ability to manage health-related needs will improve 06/15/2020 1024 by Jannett Schmall, Helane Gunther, RN Outcome: Adequate for Discharge 06/15/2020 1024 by Deetta Perla, RN Outcome: Progressing 06/15/2020 1022 by Deetta Perla, RN Outcome: Progressing   Problem: Clinical Measurements: Goal: Ability to maintain clinical measurements within normal limits will improve 06/15/2020 1024 by Waleska Buttery, Helane Gunther, RN Outcome: Adequate for Discharge 06/15/2020 1024 by Deetta Perla, RN Outcome: Progressing 06/15/2020 1022 by Deetta Perla, RN Outcome: Progressing Goal: Will remain free from infection 06/15/2020 1024 by Deetta Perla, RN Outcome: Adequate for Discharge 06/15/2020 1024 by Deetta Perla, RN Outcome: Progressing 06/15/2020 1022 by Deetta Perla, RN Outcome: Progressing Goal: Diagnostic test results will improve 06/15/2020 1024 by Deetta Perla, RN Outcome: Adequate for Discharge 06/15/2020 1024 by Deetta Perla, RN Outcome: Progressing 06/15/2020 1022 by Deetta Perla,  RN Outcome: Progressing Goal: Respiratory complications will improve 06/15/2020 1024 by Deetta Perla, RN Outcome: Adequate for Discharge 06/15/2020 1024 by Deetta Perla, RN Outcome: Progressing 06/15/2020 1022 by Deetta Perla, RN Outcome: Progressing Goal: Cardiovascular complication will be avoided 06/15/2020 1024 by Deetta Perla, RN Outcome: Adequate for Discharge 06/15/2020 1024 by Deetta Perla, RN Outcome: Progressing 06/15/2020 1022 by Deetta Perla, RN Outcome: Progressing   Problem: Activity: Goal: Risk for activity intolerance will decrease 06/15/2020 1024 by Janaisha Tolsma, Helane Gunther, RN Outcome: Adequate for Discharge 06/15/2020 1024 by Deetta Perla, RN Outcome: Progressing 06/15/2020 1022 by Deetta Perla, RN Outcome: Progressing   Problem: Nutrition: Goal: Adequate nutrition will be maintained 06/15/2020 1024 by Deetta Perla, RN Outcome: Adequate for Discharge 06/15/2020 1024 by Deetta Perla, RN Outcome: Progressing 06/15/2020 1022 by Deetta Perla, RN Outcome: Progressing   Problem: Coping: Goal: Level of anxiety will decrease 06/15/2020 1024 by Deetta Perla, RN Outcome: Adequate for Discharge 06/15/2020 1024 by Deetta Perla, RN Outcome: Progressing 06/15/2020 1022 by Deetta Perla, RN Outcome: Progressing   Problem: Elimination: Goal: Will not experience complications related to bowel motility 06/15/2020 1024 by Deetta Perla, RN Outcome: Adequate for Discharge 06/15/2020 1024 by Deetta Perla, RN Outcome: Progressing 06/15/2020 1022 by Deetta Perla, RN Outcome: Progressing Goal: Will not experience complications related to urinary retention 06/15/2020 1024 by Deetta Perla, RN Outcome: Adequate for Discharge 06/15/2020 1024 by Deetta Perla, RN Outcome: Progressing 06/15/2020 1022 by Deetta Perla, RN Outcome: Progressing   Problem: Pain Managment: Goal: General experience of comfort will  improve 06/15/2020 1024 by Deetta Perla, RN Outcome: Adequate for Discharge 06/15/2020 1024 by Deetta Perla, RN Outcome: Progressing 06/15/2020 1022 by Deetta Perla, RN Outcome: Progressing   Problem: Skin Integrity: Goal: Risk for impaired skin integrity will decrease 06/15/2020 1024 by Deetta Perla, RN Outcome: Adequate for Discharge 06/15/2020 1024 by Deetta Perla, RN Outcome: Progressing 06/15/2020 1022 by Deetta Perla, RN Outcome: Progressing   Problem: Safety: Goal: Ability  to remain free from injury will improve 06/15/2020 1024 by Naylea Wigington, Petra Kuba, RN Outcome: Adequate for Discharge 06/15/2020 1024 by Minette Brine, RN Outcome: Progressing 06/15/2020 1022 by Sajjad Honea, Petra Kuba, RN Outcome: Progressing

## 2020-06-15 NOTE — Progress Notes (Signed)
    Subjective:  Patient reports pain as mild.  Denies N/V/CP/SOB. States he wants to go home. Has cleared PT.  Objective:   VITALS:   Vitals:   06/14/20 1750 06/14/20 2243 06/15/20 0157 06/15/20 0633  BP: 139/86 123/63 128/66 137/82  Pulse: (!) 110 94 84 85  Resp: 17 18 16 16   Temp: 97.7 F (36.5 C) 97.9 F (36.6 C) 98.2 F (36.8 C) 98.6 F (37 C)  TempSrc: Oral Oral Oral Oral  SpO2: 97% 98% 96% 96%  Weight:        NAD ABD soft Sensation intact distally Intact pulses distally Dorsiflexion/Plantar flexion intact Incision: dressing C/D/I Compartment soft   Lab Results  Component Value Date   WBC 14.7 (H) 06/15/2020   HGB 14.1 06/15/2020   HCT 39.3 06/15/2020   MCV 95.9 06/15/2020   PLT 210 06/15/2020   BMET    Component Value Date/Time   NA 138 06/15/2020 0410   NA 141 05/27/2018 1348   K 3.7 06/15/2020 0410   CL 104 06/15/2020 0410   CO2 23 06/15/2020 0410   GLUCOSE 156 (H) 06/15/2020 0410   BUN 18 06/15/2020 0410   BUN 17 05/27/2018 1348   CREATININE 1.30 (H) 06/15/2020 0410   CREATININE 1.51 (H) 07/05/2016 0851   CALCIUM 8.5 (L) 06/15/2020 0410   GFRNONAA 59 (L) 06/15/2020 0410   GFRAA 72 05/27/2018 1348     Assessment/Plan: 1 Day Post-Op   Principal Problem:   Osteoarthritis of left hip   WBAT with walker DVT ppx: apixaban, SCDs, TEDS PO pain control PT/OT Cr mildly elevated, UOP excellent, plan to f/u with PCP in 1 week Dispo: D/C home with HEP   05/29/2018 Marcela Alatorre 06/15/2020, 10:14 AM   08/13/2020, MD [phone] [facility] is now [facility]  [place] [address], [address], [place], [place] Phone: [phone] [email or web address] Facebook  [phone]

## 2020-06-15 NOTE — Plan of Care (Signed)

## 2020-06-15 NOTE — Plan of Care (Signed)
  Problem: Education: Goal: Knowledge of the prescribed therapeutic regimen will improve Outcome: Progressing Goal: Understanding of discharge needs will improve Outcome: Progressing Goal: Individualized Educational Video(s) Outcome: Progressing   Problem: Activity: Goal: Ability to avoid complications of mobility impairment will improve Outcome: Progressing Goal: Ability to tolerate increased activity will improve Outcome: Progressing   Problem: Clinical Measurements: Goal: Postoperative complications will be avoided or minimized Outcome: Progressing   Problem: Pain Management: Goal: Pain level will decrease with appropriate interventions Outcome: Progressing   Problem: Skin Integrity: Goal: Will show signs of wound healing Outcome: Progressing   Problem: Education: Goal: Knowledge of the prescribed therapeutic regimen will improve Outcome: Progressing Goal: Understanding of discharge needs will improve Outcome: Progressing Goal: Individualized Educational Video(s) Outcome: Progressing   Problem: Activity: Goal: Ability to avoid complications of mobility impairment will improve Outcome: Progressing Goal: Ability to tolerate increased activity will improve Outcome: Progressing   Problem: Clinical Measurements: Goal: Postoperative complications will be avoided or minimized Outcome: Progressing   Problem: Pain Management: Goal: Pain level will decrease with appropriate interventions Outcome: Progressing   Problem: Skin Integrity: Goal: Will show signs of wound healing Outcome: Progressing   Problem: Education: Goal: Knowledge of General Education information will improve Description: Including pain rating scale, medication(s)/side effects and non-pharmacologic comfort measures Outcome: Progressing   Problem: Health Behavior/Discharge Planning: Goal: Ability to manage health-related needs will improve Outcome: Progressing   Problem: Clinical  Measurements: Goal: Ability to maintain clinical measurements within normal limits will improve Outcome: Progressing Goal: Will remain free from infection Outcome: Progressing Goal: Diagnostic test results will improve Outcome: Progressing Goal: Respiratory complications will improve Outcome: Progressing Goal: Cardiovascular complication will be avoided Outcome: Progressing   Problem: Activity: Goal: Risk for activity intolerance will decrease Outcome: Progressing   Problem: Nutrition: Goal: Adequate nutrition will be maintained Outcome: Progressing   Problem: Coping: Goal: Level of anxiety will decrease Outcome: Progressing   Problem: Elimination: Goal: Will not experience complications related to bowel motility Outcome: Progressing Goal: Will not experience complications related to urinary retention Outcome: Progressing   Problem: Pain Managment: Goal: General experience of comfort will improve Outcome: Progressing   Problem: Safety: Goal: Ability to remain free from injury will improve Outcome: Progressing   Problem: Skin Integrity: Goal: Risk for impaired skin integrity will decrease Outcome: Progressing

## 2020-06-15 NOTE — Progress Notes (Signed)
Physical Therapy Treatment Patient Details Name: Steve Weaver MRN: [id number] DOB: November 21, 1950 Today's Date: 06/15/2020    History of Present Illness Patient is 70 y.o. male s/p Lt THA anterior approach on 06/14/20 with PMH significant for HTN, HLD, OA, A-fib/    PT Comments    Pt performed HEP with assist.  Written instruction provided and reviewed including progression.  Gait/stairs deferred to later on arrival of breakfast.   Follow Up Recommendations  Follow surgeon's recommendation for DC plan and follow-up therapies     Equipment Recommendations  Rolling walker with 5" wheels;3in1 (PT)    Recommendations for Other Services       Precautions / Restrictions Precautions Precautions: Fall Restrictions Weight Bearing Restrictions: No Other Position/Activity Restrictions: WBAT    Mobility  Bed Mobility               General bed mobility comments: Pt in chair and reports no difficulty with OOB this am  Transfers                    Ambulation/Gait                 Stairs             Wheelchair Mobility    Modified Rankin (Stroke Patients Only)       Balance                                            Cognition Arousal/Alertness: Awake/alert Behavior During Therapy: WFL for tasks assessed/performed Overall Cognitive Status: Within Functional Limits for tasks assessed                                        Exercises Total Joint Exercises Ankle Circles/Pumps: AROM;Both;20 reps;Seated Quad Sets: AROM;Left;10 reps;Supine Heel Slides: 5 reps;AAROM;AROM;Left;20 reps;Supine Hip ABduction/ADduction: AAROM;AROM;Left;15 reps;Supine Long Arc Quad: AROM;Left;10 reps;Seated    General Comments        Pertinent Vitals/Pain Pain Assessment: 0-10 Pain Score: 1  Pain Location: Lt hip Pain Descriptors / Indicators: Discomfort Pain Intervention(s): Limited activity within patient's tolerance;Monitored  during session;Ice applied    Home Living                      Prior Function            PT Goals (current goals can now be found in the care plan section) Acute Rehab PT Goals Patient Stated Goal: regain independence PT Goal Formulation: With patient Time For Goal Achievement: 06/21/20 Potential to Achieve Goals: Good Progress towards PT goals: Progressing toward goals    Frequency    7X/week      PT Plan Current plan remains appropriate    Co-evaluation              AM-PAC PT "6 Clicks" Mobility   Outcome Measure  Help needed turning from your back to your side while in a flat bed without using bedrails?: A Little Help needed moving from lying on your back to sitting on the side of a flat bed without using bedrails?: A Little Help needed moving to and from a bed to a chair (including a wheelchair)?: A Little Help needed standing up from a chair using your arms (e.g., wheelchair  or bedside chair)?: A Little Help needed to walk in hospital room?: A Little Help needed climbing 3-5 steps with a railing? : A Little 6 Click Score: 18    End of Session Equipment Utilized During Treatment: Gait belt Activity Tolerance: Patient tolerated treatment well Patient left: in chair;with chair alarm set;with call bell/phone within reach;with family/visitor present Nurse Communication: Mobility status PT Visit Diagnosis: Muscle weakness (generalized) (M62.81);Difficulty in walking, not elsewhere classified (R26.2)     Time: 4360-6770 PT Time Calculation (min) (ACUTE ONLY): 28 min  Charges:  $Therapeutic Exercise: 23-37 mins                     Mauro Kaufmann PT Acute Rehabilitation Services Pager [phone] Office [phone]    [name] 06/15/2020, 1:02 PM

## 2020-07-04 DIAGNOSIS — Z96642 Presence of left artificial hip joint: Secondary | ICD-10-CM | POA: Diagnosis not present

## 2020-07-04 DIAGNOSIS — Z471 Aftercare following joint replacement surgery: Secondary | ICD-10-CM | POA: Diagnosis not present

## 2020-08-01 DIAGNOSIS — Z471 Aftercare following joint replacement surgery: Secondary | ICD-10-CM | POA: Diagnosis not present

## 2020-08-01 DIAGNOSIS — Z96642 Presence of left artificial hip joint: Secondary | ICD-10-CM | POA: Diagnosis not present

## 2020-09-30 ENCOUNTER — Other Ambulatory Visit: Payer: Self-pay | Admitting: Internal Medicine

## 2020-10-02 ENCOUNTER — Other Ambulatory Visit: Payer: Self-pay | Admitting: Internal Medicine

## 2020-10-02 NOTE — Telephone Encounter (Signed)
57m, 85.9kg, scr 1.3 06/15/20, lovw/hilty 04/13/20

## 2020-10-04 DIAGNOSIS — G4733 Obstructive sleep apnea (adult) (pediatric): Secondary | ICD-10-CM | POA: Diagnosis not present

## 2020-10-04 DIAGNOSIS — D6869 Other thrombophilia: Secondary | ICD-10-CM | POA: Diagnosis not present

## 2020-10-04 DIAGNOSIS — Z1339 Encounter for screening examination for other mental health and behavioral disorders: Secondary | ICD-10-CM | POA: Diagnosis not present

## 2020-10-04 DIAGNOSIS — Z7901 Long term (current) use of anticoagulants: Secondary | ICD-10-CM | POA: Diagnosis not present

## 2020-10-04 DIAGNOSIS — E7800 Pure hypercholesterolemia, unspecified: Secondary | ICD-10-CM | POA: Diagnosis not present

## 2020-10-04 DIAGNOSIS — I129 Hypertensive chronic kidney disease with stage 1 through stage 4 chronic kidney disease, or unspecified chronic kidney disease: Secondary | ICD-10-CM | POA: Diagnosis not present

## 2020-10-04 DIAGNOSIS — N390 Urinary tract infection, site not specified: Secondary | ICD-10-CM | POA: Diagnosis not present

## 2020-10-04 DIAGNOSIS — R350 Frequency of micturition: Secondary | ICD-10-CM | POA: Diagnosis not present

## 2020-10-04 DIAGNOSIS — I480 Paroxysmal atrial fibrillation: Secondary | ICD-10-CM | POA: Diagnosis not present

## 2020-10-04 DIAGNOSIS — Z1331 Encounter for screening for depression: Secondary | ICD-10-CM | POA: Diagnosis not present

## 2020-10-04 DIAGNOSIS — N1831 Chronic kidney disease, stage 3a: Secondary | ICD-10-CM | POA: Diagnosis not present

## 2020-10-04 DIAGNOSIS — E663 Overweight: Secondary | ICD-10-CM | POA: Diagnosis not present

## 2020-10-04 DIAGNOSIS — D692 Other nonthrombocytopenic purpura: Secondary | ICD-10-CM | POA: Diagnosis not present

## 2021-02-21 ENCOUNTER — Other Ambulatory Visit: Payer: Self-pay | Admitting: Internal Medicine

## 2021-04-04 ENCOUNTER — Other Ambulatory Visit: Payer: Self-pay | Admitting: Internal Medicine

## 2021-04-04 NOTE — Telephone Encounter (Signed)
Prescription refill request for Eliquis received. Indication:afib Last office visit:hilty 04/13/20 Scr:1.3 06/15/20 Age: 76m Weight:85.9kg

## 2021-04-09 DIAGNOSIS — N1831 Chronic kidney disease, stage 3a: Secondary | ICD-10-CM | POA: Diagnosis not present

## 2021-04-09 DIAGNOSIS — I480 Paroxysmal atrial fibrillation: Secondary | ICD-10-CM | POA: Diagnosis not present

## 2021-04-09 DIAGNOSIS — E7800 Pure hypercholesterolemia, unspecified: Secondary | ICD-10-CM | POA: Diagnosis not present

## 2021-04-09 DIAGNOSIS — I129 Hypertensive chronic kidney disease with stage 1 through stage 4 chronic kidney disease, or unspecified chronic kidney disease: Secondary | ICD-10-CM | POA: Diagnosis not present

## 2021-05-02 DIAGNOSIS — Z125 Encounter for screening for malignant neoplasm of prostate: Secondary | ICD-10-CM | POA: Diagnosis not present

## 2021-05-02 DIAGNOSIS — E7800 Pure hypercholesterolemia, unspecified: Secondary | ICD-10-CM | POA: Diagnosis not present

## 2021-05-02 DIAGNOSIS — I129 Hypertensive chronic kidney disease with stage 1 through stage 4 chronic kidney disease, or unspecified chronic kidney disease: Secondary | ICD-10-CM | POA: Diagnosis not present

## 2021-05-02 DIAGNOSIS — N1831 Chronic kidney disease, stage 3a: Secondary | ICD-10-CM | POA: Diagnosis not present

## 2021-05-05 ENCOUNTER — Other Ambulatory Visit: Payer: Self-pay | Admitting: Internal Medicine

## 2021-05-11 DIAGNOSIS — G4733 Obstructive sleep apnea (adult) (pediatric): Secondary | ICD-10-CM | POA: Diagnosis not present

## 2021-05-11 DIAGNOSIS — Z1339 Encounter for screening examination for other mental health and behavioral disorders: Secondary | ICD-10-CM | POA: Diagnosis not present

## 2021-05-11 DIAGNOSIS — E663 Overweight: Secondary | ICD-10-CM | POA: Diagnosis not present

## 2021-05-11 DIAGNOSIS — Z1331 Encounter for screening for depression: Secondary | ICD-10-CM | POA: Diagnosis not present

## 2021-05-11 DIAGNOSIS — Z1212 Encounter for screening for malignant neoplasm of rectum: Secondary | ICD-10-CM | POA: Diagnosis not present

## 2021-05-11 DIAGNOSIS — M5136 Other intervertebral disc degeneration, lumbar region: Secondary | ICD-10-CM | POA: Diagnosis not present

## 2021-05-11 DIAGNOSIS — Z7901 Long term (current) use of anticoagulants: Secondary | ICD-10-CM | POA: Diagnosis not present

## 2021-05-11 DIAGNOSIS — I480 Paroxysmal atrial fibrillation: Secondary | ICD-10-CM | POA: Diagnosis not present

## 2021-05-11 DIAGNOSIS — Z0000 Encounter for general adult medical examination without abnormal findings: Secondary | ICD-10-CM | POA: Diagnosis not present

## 2021-05-11 DIAGNOSIS — C44311 Basal cell carcinoma of skin of nose: Secondary | ICD-10-CM | POA: Diagnosis not present

## 2021-05-11 DIAGNOSIS — N1831 Chronic kidney disease, stage 3a: Secondary | ICD-10-CM | POA: Diagnosis not present

## 2021-05-11 DIAGNOSIS — R82998 Other abnormal findings in urine: Secondary | ICD-10-CM | POA: Diagnosis not present

## 2021-05-11 DIAGNOSIS — D6869 Other thrombophilia: Secondary | ICD-10-CM | POA: Diagnosis not present

## 2021-05-11 DIAGNOSIS — R972 Elevated prostate specific antigen [PSA]: Secondary | ICD-10-CM | POA: Diagnosis not present

## 2021-05-11 DIAGNOSIS — E7800 Pure hypercholesterolemia, unspecified: Secondary | ICD-10-CM | POA: Diagnosis not present

## 2021-05-11 DIAGNOSIS — I129 Hypertensive chronic kidney disease with stage 1 through stage 4 chronic kidney disease, or unspecified chronic kidney disease: Secondary | ICD-10-CM | POA: Diagnosis not present

## 2021-06-20 ENCOUNTER — Other Ambulatory Visit: Payer: Self-pay | Admitting: Internal Medicine

## 2021-07-06 DIAGNOSIS — N5201 Erectile dysfunction due to arterial insufficiency: Secondary | ICD-10-CM | POA: Diagnosis not present

## 2021-07-06 DIAGNOSIS — N402 Nodular prostate without lower urinary tract symptoms: Secondary | ICD-10-CM | POA: Diagnosis not present

## 2021-07-08 DIAGNOSIS — E7800 Pure hypercholesterolemia, unspecified: Secondary | ICD-10-CM | POA: Diagnosis not present

## 2021-07-08 DIAGNOSIS — I129 Hypertensive chronic kidney disease with stage 1 through stage 4 chronic kidney disease, or unspecified chronic kidney disease: Secondary | ICD-10-CM | POA: Diagnosis not present

## 2021-07-08 DIAGNOSIS — I480 Paroxysmal atrial fibrillation: Secondary | ICD-10-CM | POA: Diagnosis not present

## 2021-07-08 DIAGNOSIS — N1831 Chronic kidney disease, stage 3a: Secondary | ICD-10-CM | POA: Diagnosis not present

## 2021-07-10 ENCOUNTER — Telehealth: Payer: Self-pay

## 2021-07-10 NOTE — Telephone Encounter (Signed)
Patient with diagnosis of afib on Eliquis for anticoagulation.    Procedure: prostate biopsy Date of procedure: 08/10/21  CHA2DS2-VASc Score = 2  This indicates a 2.2% annual risk of stroke. The patient's score is based upon: CHF History: 0 HTN History: 1 Diabetes History: 0 Stroke History: 0 Vascular Disease History: 0 Age Score: 1 Gender Score: 0   CrCl 1mL/min Platelet count 210K  Pt has not been seen by cardiology in over a year (next appt scheduled April 2023), but assuming no new risk factors since his last visit, he can hold Eliquis for 2 days prior to procedure.

## 2021-07-10 NOTE — Telephone Encounter (Signed)
° °  Pre-operative Risk Assessment    Patient Name: Steve Weaver  DOB: Jun 03, 1951 MRN: [id number]      Request for Surgical Clearance    Procedure:  Prostate biopsy   Date of Surgery:  Clearance 08/10/21                                 Surgeon:  Not listed Surgeon's Group or Practice Name:  [facility]  Phone number:  [phone] Fax number:  [phone]   Type of Clearance Requested:   - Medical  - Pharmacy:  Hold Apixaban (Eliquis)     Type of Anesthesia:  Not Indicated   Additional requests/questions:  Please advise surgeon/provider what medications should be held. Please fax a copy of clearance to the surgeon's office.  Evalee Mutton   07/10/2021, 3:43 PM

## 2021-07-12 NOTE — Telephone Encounter (Signed)
LMOM for patient to call the office for an appointment.

## 2021-07-12 NOTE — Telephone Encounter (Signed)
° °  Name: Steve Weaver  DOB: 11-11-1950  MRN: [id number]  Primary Cardiologist: Pixie Casino, MD  Chart reviewed as part of pre-operative protocol coverage. Because of Steve Weaver's past medical history and time since last visit, he will require a follow-up visit in order to better assess preoperative cardiovascular risk.  Pre-op covering staff: - Please schedule appointment and call patient to inform them. If patient already had an upcoming appointment within acceptable timeframe, please add "pre-op clearance" to the appointment notes so provider is aware. - Please contact requesting surgeon's office via preferred method (i.e, phone, fax) to inform them of need for appointment prior to surgery.  If applicable, this message will also be routed to pharmacy pool and/or primary cardiologist for input on holding anticoagulant/antiplatelet agent as requested below so that this information is available to the clearing provider at time of patient's appointment.   Currently scheduled to see Dr. Debara Pickett in April, surgery is in early March, will need to move up appointment date.  Steve Weaver, [place]  07/12/2021, 4:17 PM

## 2021-07-13 ENCOUNTER — Telehealth: Payer: Self-pay | Admitting: Internal Medicine

## 2021-07-13 NOTE — Telephone Encounter (Signed)
Follow Up:      Patient did return call. He was made aware that he need an appointment before his procedure on 08-10-21. There was only 1 available slot before his appointment and he could not come that day, which is 07-17-21. There are no other available appointment dates at this time before his procedure. Patient was put on The Wait List.

## 2021-07-16 NOTE — Telephone Encounter (Signed)
Surgeon's office is aware that pt will need an appointment. Left another message for pt to call back on original clearance request.

## 2021-07-16 NOTE — Telephone Encounter (Signed)
Pre-op covering staff, can you please update surgeon's office. Patient has not been seen by our office in over 1 year so we can not complete pre-op risk assessment until he is seen in the office. There is another phone note with the original clearance form. I am going to remove this from the pre-op pool.  Thank you!

## 2021-07-16 NOTE — Telephone Encounter (Signed)
2nd attempt to reach pt regarding surgical clearance and the need for a sooner appointment for clearance. Left a message for pt to call back to schedule.

## 2021-07-19 NOTE — Telephone Encounter (Signed)
Pt is scheduled for Pre-op appt on 07/30/2021 with Almyra Deforest, PA.

## 2021-07-29 NOTE — Progress Notes (Incomplete)
Cardiology Office Note:    Date:  07/29/2021   ID:  Steve Weaver, DOB 09-08-1950, MRN [id number]  PCP:  Haywood Pao, MD   [facility] Providers Cardiologist:  Pixie Casino, MD { Click to update primary MD,subspecialty MD or APP then REFRESH:1}    Referring MD: Tisovec, Fransico Him, MD   No chief complaint on file. ***  History of Present Illness:    Steve Weaver is a 71 y.o. male with a hx of longstanding persistent atrial fibrillation, HTN, PACs, sleep apnea improved after septal surgery, and back pain.   He was found to be in atrial fibrillation in 2017 and underwent unsuccessful cardioversion. He did not convert to SR on flecainide and remained on rate control therapy as he was generally asymptomatic. He was seen by Dr. Rayann Heman and felt not to be a candidate for ablation On chronic anticoagulation  He was last seen in our office on 04/13/20 by Dr. Debara Pickett. Echo revealed LVEF 55-60%, no rwma, mild LVH, G1 DD, mild mitral regurgitation, mild AI. No changes to medical therapy and one year follow-up was recommended.   He is here today for preoperative clearance for upcoming prostate biopsy. He is overdue for follow-up   Past Medical History:  Diagnosis Date   Arthritis    Dysrhythmia    afib   Hyperlipidemia    Hypertension    Longstanding persistent atrial fibrillation [facility])     Past Surgical History:  Procedure Laterality Date   BROKE LEG  11/1998   CARDIOVERSION N/A 06/08/2015   Procedure: CARDIOVERSION;  Surgeon: Pixie Casino, MD;  Location: [place];  Service: Cardiovascular;  Laterality: N/A;   CARDIOVERSION N/A 09/11/2015   Procedure: CARDIOVERSION;  Surgeon: Pixie Casino, MD;  Location: [facility];  Service: Cardiovascular;  Laterality: N/A;   NASAL SEPTOPLASTY W/ TURBINOPLASTY Bilateral 12/16/2016   Procedure: NASAL SEPTOPLASTY WITH BILATERAL TURBINATE REDUCTION;  Surgeon: Leta Baptist, MD;  Location: [place];  Service: ENT;   Laterality: Bilateral;   NM MYOCAR PERF WALL MOTION  05/04/2013   bruce myoview - normal stress nuclear study; a-flutter & a-fib with RVR, EF 59%   TOTAL HIP ARTHROPLASTY Left 06/14/2020   Procedure: TOTAL HIP ARTHROPLASTY ANTERIOR APPROACH;  Surgeon: Rod Can, MD;  Location: [facility];  Service: Orthopedics;  Laterality: Left;    Current Medications: No outpatient medications have been marked as taking for the 07/30/21 encounter (Appointment) with Almyra Deforest, [place].     Allergies:   Other   Social History   Socioeconomic History   Marital status: Married    Spouse name: Not on file   Number of children: 2   Years of education: Not on file   Highest education level: Not on file  Occupational History   Occupation: builder  Tobacco Use   Smoking status: Former    Types: Cigars   Smokeless tobacco: Never   Tobacco comments:    1-2 cigars/month   quit 2018  Vaping Use   Vaping Use: Never used  Substance and Sexual Activity   Alcohol use: Yes    Alcohol/week: 6.0 standard drinks    Types: 6 Standard drinks or equivalent per week    Comment: occasional   Drug use: No   Sexual activity: Not Currently    Birth control/protection: None  Other Topics Concern   Not on file  Social History Narrative   Not on file   Social Determinants of Health  Financial Resource Strain: Not on file  Food Insecurity: Not on file  Transportation Needs: Not on file  Physical Activity: Not on file  Stress: Not on file  Social Connections: Not on file     Family History: The patient's ***family history includes Dementia in his father; Diabetes in his father; Emphysema in his mother; Hyperlipidemia in his mother; Hypertension in his mother. There is no history of Anemia, Arrhythmia, Asthma, Clotting disorder, Fainting, Heart attack, Heart disease, or Heart failure.  ROS:   Please see the history of present illness.    *** All other systems reviewed and are negative.  Labs/Other Studies  Reviewed:    The following studies were reviewed today: ***  Recent Labs: No results found for requested labs within last 8760 hours.  Recent Lipid Panel    Component Value Date/Time   CHOL 161 07/05/2016 0851   TRIG 164 (H) 07/05/2016 0851   HDL 44 07/05/2016 0851   CHOLHDL 3.7 07/05/2016 0851   VLDL 33 (H) 07/05/2016 0851   LDLCALC 84 07/05/2016 0851     Risk Assessment/Calculations:   {Does this patient have ATRIAL FIBRILLATION?:[id number]}       Physical Exam:    VS:  There were no vitals taken for this visit.    Wt Readings from Last 3 Encounters:  06/14/20 190 lb 5.5 oz (86.3 kg)  06/06/20 190 lb 7 oz (86.4 kg)  04/13/20 189 lb 6.4 oz (85.9 kg)     GEN: *** Well nourished, well developed in no acute distress HEENT: Normal NECK: No JVD; No carotid bruits CARDIAC: ***RRR, no murmurs, rubs, gallops RESPIRATORY:  Clear to auscultation without rales, wheezing or rhonchi  ABDOMEN: Soft, non-tender, non-distended MUSCULOSKELETAL:  No edema; No deformity. *** pedal pulses, ***bilaterally SKIN: Warm and dry NEUROLOGIC:  Alert and oriented x 3 PSYCHIATRIC:  Normal affect   EKG:  EKG is *** ordered today.  The ekg ordered today demonstrates ***  Diagnoses:    No diagnosis found. Assessment and Plan:     ***          {Are you ordering a CV Procedure (e.g. stress test, cath, DCCV, TEE, etc)?   Press F2        :[id number]}    Medication Adjustments/Labs and Tests Ordered: Current medicines are reviewed at length with the patient today.  Concerns regarding medicines are outlined above.  No orders of the defined types were placed in this encounter.  No orders of the defined types were placed in this encounter.   There are no Patient Instructions on file for this visit.   Signed, Emmaline Life, NP  07/29/2021 8:20 AM    [place]

## 2021-07-30 ENCOUNTER — Ambulatory Visit: Payer: PPO | Admitting: Nurse Practitioner

## 2021-08-07 DIAGNOSIS — N1831 Chronic kidney disease, stage 3a: Secondary | ICD-10-CM | POA: Diagnosis not present

## 2021-08-07 DIAGNOSIS — I480 Paroxysmal atrial fibrillation: Secondary | ICD-10-CM | POA: Diagnosis not present

## 2021-08-07 DIAGNOSIS — E7800 Pure hypercholesterolemia, unspecified: Secondary | ICD-10-CM | POA: Diagnosis not present

## 2021-08-07 DIAGNOSIS — I129 Hypertensive chronic kidney disease with stage 1 through stage 4 chronic kidney disease, or unspecified chronic kidney disease: Secondary | ICD-10-CM | POA: Diagnosis not present

## 2021-08-20 ENCOUNTER — Other Ambulatory Visit: Payer: Self-pay

## 2021-08-20 MED ORDER — APIXABAN 5 MG PO TABS: 5.0000 mg | ORAL_TABLET | Freq: Two times a day (BID) | ORAL | 1 refills | Status: DC

## 2021-08-20 NOTE — Telephone Encounter (Signed)
Received faxed refill request for Eliquis from [facility] [address] ?

## 2021-08-20 NOTE — Telephone Encounter (Signed)
Prescription refill request for Eliquis received. ?Indication:Afib ?Last office visit:upcoming ?VGC:YOYOO labs ?Age: 71 ?Weight:86.3 kg ? ?Prescription refilled ? ?

## 2021-08-31 ENCOUNTER — Other Ambulatory Visit: Payer: Self-pay | Admitting: Internal Medicine

## 2021-08-31 DIAGNOSIS — I4892 Unspecified atrial flutter: Secondary | ICD-10-CM

## 2021-09-04 ENCOUNTER — Encounter (HOSPITAL_BASED_OUTPATIENT_CLINIC_OR_DEPARTMENT_OTHER): Payer: Self-pay | Admitting: Family

## 2021-09-04 ENCOUNTER — Other Ambulatory Visit: Payer: Self-pay

## 2021-09-04 ENCOUNTER — Ambulatory Visit (HOSPITAL_BASED_OUTPATIENT_CLINIC_OR_DEPARTMENT_OTHER): Payer: PPO | Admitting: Family

## 2021-09-04 VITALS — BP 140/82 | HR 101 | Ht 67.0 in | Wt 196.1 lb

## 2021-09-04 DIAGNOSIS — I4821 Permanent atrial fibrillation: Secondary | ICD-10-CM

## 2021-09-04 DIAGNOSIS — D6859 Other primary thrombophilia: Secondary | ICD-10-CM | POA: Diagnosis not present

## 2021-09-04 DIAGNOSIS — I10 Essential (primary) hypertension: Secondary | ICD-10-CM | POA: Diagnosis not present

## 2021-09-04 DIAGNOSIS — Z01818 Encounter for other preprocedural examination: Secondary | ICD-10-CM

## 2021-09-04 DIAGNOSIS — E782 Mixed hyperlipidemia: Secondary | ICD-10-CM | POA: Diagnosis not present

## 2021-09-04 DIAGNOSIS — Z8679 Personal history of other diseases of the circulatory system: Secondary | ICD-10-CM

## 2021-09-04 NOTE — Progress Notes (Signed)
? ?Office Visit  ?  ?Patient Name: Steve Weaver ?Date of Encounter: 09/04/2021 ? ?PCP:  Haywood Pao, MD ?  ?[place]  ?Cardiologist:  Pixie Casino, MD  ?Advanced Practice Provider:  No care team member to display ?Electrophysiologist:  None  ?   ? ?Chief Complaint  ?  ?Steve Weaver is a 71 y.o. male with a hx of AI, HTN, permanent atrial fibrillation presents today for preop clearance.  ? ?Past Medical History  ?  ?Past Medical History:  ?Diagnosis Date  ? Arthritis   ? Dysrhythmia   ? afib  ? Hyperlipidemia   ? Hypertension   ? Longstanding persistent atrial fibrillation ([place])   ? ?Past Surgical History:  ?Procedure Laterality Date  ? BROKE LEG  11/1998  ? CARDIOVERSION N/A 06/08/2015  ? Procedure: CARDIOVERSION;  Surgeon: Pixie Casino, MD;  Location: [place];  Service: Cardiovascular;  Laterality: N/A;  ? CARDIOVERSION N/A 09/11/2015  ? Procedure: CARDIOVERSION;  Surgeon: Pixie Casino, MD;  Location: [facility];  Service: Cardiovascular;  Laterality: N/A;  ? NASAL SEPTOPLASTY W/ TURBINOPLASTY Bilateral 12/16/2016  ? Procedure: NASAL SEPTOPLASTY WITH BILATERAL TURBINATE REDUCTION;  Surgeon: Leta Baptist, MD;  Location: [place];  Service: ENT;  Laterality: Bilateral;  ? NM MYOCAR [place]  05/04/2013  ? bruce myoview - normal stress nuclear study; a-flutter & a-fib with RVR, EF 59%  ? TOTAL HIP ARTHROPLASTY Left 06/14/2020  ? Procedure: TOTAL HIP ARTHROPLASTY ANTERIOR APPROACH;  Surgeon: Rod Can, MD;  Location: [facility];  Service: Orthopedics;  Laterality: Left;  ? ? ?Allergies ? ?Allergies  ?Allergen Reactions  ? Other Rash  ?  Muscle Relaxer (unsure of name)  ? ? ?History of Present Illness  ?  ?Steve Weaver is a 71 y.o. male with a hx of AI, HTN, permanent atrial fibrillation last seen 04/13/20 by Dr. Debara Pickett. ? ?He has followed with Dr. Debara Pickett for many years regarding his atrial fibrillation. Cardioversion was attempted twice but  due to early recurrence of atrial fibrillation he has been maintained with rate control. He was last seen 04/13/20 by Dr. Debara Pickett doing overall well form a cardiac perspective and no changes were made.  ? ?Presents today for preoperative clearance for prostate biopsy due to elevation in PSA. 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 Also enjoys spending time with his children and grandchildren. Heart rate at home when checked on smart watch 70 bpm - 76 bpm. Notes he gets tired more easily than he used to. For example mowing the lawn, will take a 5 minute break to get a drink then go back to mowing. This has been ongoing over the last 5-6 years. Reports no shortness of breath nor dyspnea on exertion. Reports no chest pain, pressure, or tightness. No edema, orthopnea, PND. Reports no palpitations.   ? ?EKGs/Labs/Other Studies Reviewed:  ? ?The following studies were reviewed today: ? ?Echo 04/2020 ? 1. Left ventricular ejection fraction, by estimation, is 55 to 60%. The  ?left ventricle has normal function. The left ventricle has no regional  ?wall motion abnormalities. There is mild left ventricular hypertrophy.  ?Left ventricular diastolic parameters  ?are consistent with Grade I diastolic dysfunction (impaired relaxation).  ? 2. Right ventricular systolic function is normal. The right ventricular  ?size is normal.  ? 3. The mitral valve is normal in structure. Mild mitral valve  ?regurgitation. No evidence of mitral stenosis.  ?  4. The aortic valve is normal in structure. Aortic valve regurgitation is  ?mild. No aortic stenosis is present.  ? 5. The inferior vena cava is normal in size with greater than 50%  ?respiratory variability, suggesting right atrial pressure of 3 mmHg.  ? ?EKG:  EKG is  ordered today.  The ekg ordered today demonstrates atrial fibrillation 101 bpm with no acute ST/T wave changes.  ? ?Recent Labs: ?No results found for requested labs within last 8760 hours.  ?Recent Lipid Panel ?    ?Component Value Date/Time  ? CHOL 161 07/05/2016 0851  ? TRIG 164 (H) 07/05/2016 0851  ? HDL 44 07/05/2016 0851  ? CHOLHDL 3.7 07/05/2016 0851  ? VLDL 33 (H) 07/05/2016 0851  ? [place] 84 07/05/2016 0851  ? ? ?Risk Assessment/Calculations:  ? ?CHA2DS2-VASc Score = 2  ? This indicates a 2.2% annual risk of stroke. ?The patient's score is based upon: ?CHF History: 0 ?HTN History: 1 ?Diabetes History: 0 ?Stroke History: 0 ?Vascular Disease History: 0 ?Age Score: 1 ?Gender Score: 0 ?  ?Home Medications  ? ?Current Meds  ?Medication Sig  ? apixaban (ELIQUIS) 5 MG TABS tablet Take 1 tablet (5 mg total) by mouth 2 (two) times daily.  ? chlorthalidone (HYGROTON) 25 MG tablet TAKE 1/2 TABLET BY MOUTH EVERY DAY  ? Cholecalciferol (VITAMIN D) 50 MCG (2000 UT) tablet Take 2,000 Units by mouth daily.  ? Coenzyme Q10 (CO Q 10 PO) Take 300 mg by mouth daily.  ? diltiazem (CARDIZEM CD) 120 MG 24 hr capsule TAKE 1 CAPSULE BY MOUTH EVERY DAY  ? lovastatin (MEVACOR) 40 MG tablet TAKE 1 TABLET BY MOUTH EVERY DAY  ? Magnesium 300 MG CAPS Take 1 capsule by mouth daily at 12 noon.  ? metoprolol tartrate (LOPRESSOR) 25 MG tablet TAKE 1 & 1/2 TABLETS BY MOUTH TWICE A DAY  ? Multiple Vitamin (MULTIVITAMIN) capsule Take 1 capsule by mouth daily.  ? Omega-3 Fatty Acids (OMEGA-3 FISH OIL) 1200 MG CAPS Take 1,200 mg by mouth 2 (two) times daily.  ? Turmeric 500 MG CAPS Take 1 capsule by mouth daily at 12 noon.  ? zinc gluconate 50 MG tablet Take 50 mg by mouth daily.  ?  ?Review of Systems  ?    ?All other systems reviewed and are otherwise negative except as noted above. ? ?Physical Exam  ?  ?VS:  BP 140/82 (BP Location: Left Arm, Patient Position: Sitting, Cuff Size: Large)   Pulse (!) 101   Ht '5\' 7"'$  (1.702 m)   Wt 196 lb 1.6 oz (89 kg)   BMI 30.71 kg/m?  , BMI Body mass index is 30.71 kg/m?. ? ?Wt Readings from Last 3 Encounters:  ?09/04/21 196 lb 1.6 oz (89 kg)  ?06/14/20 190 lb 5.5 oz (86.3 kg)  ?06/06/20 190 lb 7 oz (86.4 kg)  ?   ? ?GEN: Well nourished, well developed, in no acute distress. ?HEENT: normal. ?Neck: Supple, no JVD, carotid bruits, or masses. ?Cardiac: IRIR, no murmurs, rubs, or gallops. No clubbing, cyanosis, edema.  Radials/PT 2+ and equal bilaterally.  ?Respiratory:  Respirations regular and unlabored, clear to auscultation bilaterally. ?GI: Soft, nontender, nondistended. ?MS: No deformity or atrophy. ?Skin: Warm and dry, no rash. ?Neuro:  Strength and sensation are intact. ?Psych: Normal affect. ? ?Assessment & Plan  ?  ?Preop clearance - Upcoming prostate biopsy. According to the Revised Cardiac Risk Index (RCRI), his Perioperative Risk of Major Cardiac Event is (%): 0.4. His  Functional  Capacity in METs is: 7.34 according to the Duke Activity Status Index (DASI). He is deemed acceptable risk for the planned procedure without additional cardiovascular testing. Will route to surgical team so they are aware. Per our pharmacy team based on creatinine clearance he may hold Eliquis 2 days prior to planned procedure.  ? ?Permanent atrial fibrillation / Chronic Anticoagulation - Asymptomatic in regards to his atrial fibrillation with no palpitations. Continue Diltiazem '120mg'$  QD. Continue Eliquis '5mg'$  BID. Denies bleeding complications. CHA2DS2-VASc Score = 2 [CHF History: 0, HTN History: 1, Diabetes History: 0, Stroke History: 0, Vascular Disease History: 0, Age Score: 1, Gender Score: 0].  Therefore, the patient's annual risk of stroke is 2.2 %.    Update TSH/CBC/CMP today for monitoring.  ? ?HTN - BP well controlled at home 118-135/78-84. Continue current antihypertensive regimen.   ? ?HLD - Continue Lovastatin. Managed by primary care. 04/2021 LDL 100.  ? ?OSA - Improved with septoplasty. Has not required OSA.  ? ?AI - Mild by most recent echo 04/2020. Continue optimal BP control. Will update echo for monitoring. Does not need to be completed prior to biopsy.  ? ? ?Disposition: Follow up in 1 year(s) with Pixie Casino, MD or  APP. ? ?Signed, ?Loel Dubonnet, NP ?09/04/2021, 9:55 AM ?[place] ?

## 2021-09-04 NOTE — Patient Instructions (Addendum)
Medication Instructions:  ?Continue your current medications.  ? ?Per our pharmacy team you may hold your Eliquis 2 days prior to your biopsy. Resume after your biopsy at the direction of the surgical team.  ? ?*If you need a refill on your cardiac medications before your next appointment, please call your pharmacy* ? ?Lab Work: ?Your physician recommends that you return for lab work today: CBC, TSH, CMP ? ?Testing/Procedures: ?Your EKG today showed atrial fibrillation which is stable compared to previous.  ? ?Your physician has requested that you have an echocardiogram. This is to monitor your aortic valve which has been mild to moderately leaky. This does not need to be completed prior to your biopsy. Echocardiography is a painless test that uses sound waves to create images of your heart. It provides your doctor with information about the size and shape of your heart and how well your heart?s chambers and valves are working. This procedure takes approximately one hour. There are no restrictions for this procedure.  ? ? ?Follow-Up: ?At [facility], you and your health needs are our priority.  As part of our continuing mission to provide you with exceptional heart care, we have created designated Provider Care Teams.  These Care Teams include your primary Cardiologist (physician) and Advanced Practice Providers (APPs -  Physician Assistants and Nurse Practitioners) who all work together to provide you with the care you need, when you need it. ? ?We recommend signing up for the patient portal called "MyChart".  Sign up information is provided on this After Visit Summary.  MyChart is used to connect with patients for Virtual Visits (Telemedicine).  Patients are able to view lab/test results, encounter notes, upcoming appointments, etc.  Non-urgent messages can be sent to your provider as well.   ?To learn more about what you can do with MyChart, go to [email or web address].   ? ?Your next appointment:   ?1  year(s) ? ?The format for your next appointment:   ?In Person ? ?Provider:   ?Pixie Casino, MD or Advanced Practice Provider  ? ? ?Other Instructions ? ?Loel Dubonnet, NP will send a note to your surgical team that you are cleared for your procedure.   ? ?Heart Healthy Diet Recommendations: ?A low-salt diet is recommended. Meats should be grilled, baked, or boiled. Avoid fried foods. Focus on lean protein sources like fish or chicken with vegetables and fruits. The American Heart Association is a Microbiologist!  American Heart Association Diet and Lifeystyle Recommendations   ? ?Exercise recommendations: ?The American Heart Association recommends 150 minutes of moderate intensity exercise weekly. ?Try 30 minutes of moderate intensity exercise 4-5 times per week. ?This could include walking, jogging, or swimming. ? ?

## 2021-09-05 ENCOUNTER — Telehealth (HOSPITAL_BASED_OUTPATIENT_CLINIC_OR_DEPARTMENT_OTHER): Payer: Self-pay

## 2021-09-05 LAB — COMPREHENSIVE METABOLIC PANEL
ALT: 32 IU/L (ref 0–44)
AST: 34 IU/L (ref 0–40)
Albumin/Globulin Ratio: 1.5 (ref 1.2–2.2)
Albumin: 4.6 g/dL (ref 3.7–4.7)
Alkaline Phosphatase: 114 IU/L (ref 44–121)
BUN/Creatinine Ratio: 11 (ref 10–24)
BUN: 15 mg/dL (ref 8–27)
Bilirubin Total: 0.3 mg/dL (ref 0.0–1.2)
CO2: 18 mmol/L — ABNORMAL LOW (ref 20–29)
Calcium: 10.3 mg/dL — ABNORMAL HIGH (ref 8.6–10.2)
Chloride: 102 mmol/L (ref 96–106)
Creatinine, Ser: 1.35 mg/dL — ABNORMAL HIGH (ref 0.76–1.27)
Globulin, Total: 3 g/dL (ref 1.5–4.5)
Glucose: 140 mg/dL — ABNORMAL HIGH (ref 70–99)
Potassium: 4.2 mmol/L (ref 3.5–5.2)
Sodium: 142 mmol/L (ref 134–144)
Total Protein: 7.6 g/dL (ref 6.0–8.5)
eGFR: 56 mL/min/{1.73_m2} — ABNORMAL LOW (ref >59)

## 2021-09-05 LAB — CBC
Hematocrit: 51.3 % — ABNORMAL HIGH (ref 37.5–51.0)
Hemoglobin: 17.9 g/dL — ABNORMAL HIGH (ref 13.0–17.7)
MCH: 33.2 pg — ABNORMAL HIGH (ref 26.6–33.0)
MCHC: 34.9 g/dL (ref 31.5–35.7)
MCV: 95 fL (ref 79–97)
Platelets: 240 10*3/uL (ref 150–450)
RBC: 5.39 x10E6/uL (ref 4.14–5.80)
RDW: 12.5 % (ref 11.6–15.4)
WBC: 6.6 10*3/uL (ref 3.4–10.8)

## 2021-09-05 LAB — TSH: TSH: 2.03 u[IU]/mL (ref 0.450–4.500)

## 2021-09-05 NOTE — Telephone Encounter (Addendum)
Called results to patient and left results on VM (ok per DPR), instructions left to call office back if patient has any questions!  ? ? ? ?----- Message from Loel Dubonnet, NP sent at 09/05/2021 12:21 PM EDT ----- ?CBC with no anemia or infection. Stable kidney function. Normal electrolytes, thyroid, liver function. Good result!  ?

## 2021-09-17 ENCOUNTER — Ambulatory Visit (INDEPENDENT_AMBULATORY_CARE_PROVIDER_SITE_OTHER): Payer: PPO

## 2021-09-17 DIAGNOSIS — I351 Nonrheumatic aortic (valve) insufficiency: Secondary | ICD-10-CM

## 2021-09-17 DIAGNOSIS — Z8679 Personal history of other diseases of the circulatory system: Secondary | ICD-10-CM | POA: Diagnosis not present

## 2021-09-17 LAB — ECHOCARDIOGRAM COMPLETE
AR max vel: 3.78 cm2
AV Area VTI: 3.85 cm2
AV Area mean vel: 4.08 cm2
AV Mean grad: 4 mmHg
AV Peak grad: 8.1 mmHg
AV Vena cont: 0.15 cm
Ao pk vel: 1.42 m/s
Area-P 1/2: 3.23 cm2
P 1/2 time: 568 msec
S' Lateral: 2.62 cm

## 2021-09-28 ENCOUNTER — Ambulatory Visit: Payer: PPO | Admitting: Internal Medicine

## 2021-10-22 ENCOUNTER — Other Ambulatory Visit: Payer: Self-pay | Admitting: Internal Medicine

## 2021-10-22 DIAGNOSIS — I4892 Unspecified atrial flutter: Secondary | ICD-10-CM

## 2021-10-23 MED ORDER — METOPROLOL TARTRATE 25 MG PO TABS: 25.0000 mg | ORAL_TABLET | Freq: Two times a day (BID) | ORAL | 0 refills | Status: DC

## 2021-11-02 DIAGNOSIS — C61 Malignant neoplasm of prostate: Secondary | ICD-10-CM | POA: Diagnosis not present

## 2021-11-02 DIAGNOSIS — N402 Nodular prostate without lower urinary tract symptoms: Secondary | ICD-10-CM | POA: Diagnosis not present

## 2021-11-09 DIAGNOSIS — N5201 Erectile dysfunction due to arterial insufficiency: Secondary | ICD-10-CM | POA: Diagnosis not present

## 2021-11-09 DIAGNOSIS — C61 Malignant neoplasm of prostate: Secondary | ICD-10-CM | POA: Diagnosis not present

## 2021-11-09 DIAGNOSIS — N402 Nodular prostate without lower urinary tract symptoms: Secondary | ICD-10-CM | POA: Diagnosis not present

## 2021-11-10 ENCOUNTER — Other Ambulatory Visit: Payer: Self-pay | Admitting: Internal Medicine

## 2021-11-12 ENCOUNTER — Telehealth: Payer: Self-pay | Admitting: Radiation Oncology

## 2021-11-12 ENCOUNTER — Other Ambulatory Visit (HOSPITAL_COMMUNITY): Payer: Self-pay | Admitting: Urology

## 2021-11-12 DIAGNOSIS — C61 Malignant neoplasm of prostate: Secondary | ICD-10-CM

## 2021-11-12 NOTE — Telephone Encounter (Signed)
6/5 @ 11:45 am Left voicemail for patient to call our office.

## 2021-11-16 DIAGNOSIS — Z1339 Encounter for screening examination for other mental health and behavioral disorders: Secondary | ICD-10-CM | POA: Diagnosis not present

## 2021-11-16 DIAGNOSIS — C61 Malignant neoplasm of prostate: Secondary | ICD-10-CM | POA: Diagnosis not present

## 2021-11-16 DIAGNOSIS — I129 Hypertensive chronic kidney disease with stage 1 through stage 4 chronic kidney disease, or unspecified chronic kidney disease: Secondary | ICD-10-CM | POA: Diagnosis not present

## 2021-11-16 DIAGNOSIS — D692 Other nonthrombocytopenic purpura: Secondary | ICD-10-CM | POA: Diagnosis not present

## 2021-11-16 DIAGNOSIS — M5136 Other intervertebral disc degeneration, lumbar region: Secondary | ICD-10-CM | POA: Diagnosis not present

## 2021-11-16 DIAGNOSIS — E663 Overweight: Secondary | ICD-10-CM | POA: Diagnosis not present

## 2021-11-16 DIAGNOSIS — I480 Paroxysmal atrial fibrillation: Secondary | ICD-10-CM | POA: Diagnosis not present

## 2021-11-16 DIAGNOSIS — N1831 Chronic kidney disease, stage 3a: Secondary | ICD-10-CM | POA: Diagnosis not present

## 2021-11-16 DIAGNOSIS — C44311 Basal cell carcinoma of skin of nose: Secondary | ICD-10-CM | POA: Diagnosis not present

## 2021-11-16 DIAGNOSIS — D6869 Other thrombophilia: Secondary | ICD-10-CM | POA: Diagnosis not present

## 2021-11-16 DIAGNOSIS — Z1331 Encounter for screening for depression: Secondary | ICD-10-CM | POA: Diagnosis not present

## 2021-11-16 DIAGNOSIS — E7800 Pure hypercholesterolemia, unspecified: Secondary | ICD-10-CM | POA: Diagnosis not present

## 2021-11-16 DIAGNOSIS — G4733 Obstructive sleep apnea (adult) (pediatric): Secondary | ICD-10-CM | POA: Diagnosis not present

## 2021-11-16 DIAGNOSIS — Z7901 Long term (current) use of anticoagulants: Secondary | ICD-10-CM | POA: Diagnosis not present

## 2021-11-16 DIAGNOSIS — N181 Chronic kidney disease, stage 1: Secondary | ICD-10-CM | POA: Diagnosis not present

## 2021-11-20 ENCOUNTER — Ambulatory Visit (HOSPITAL_COMMUNITY)
Admission: RE | Admit: 2021-11-20 | Discharge: 2021-11-20 | Disposition: A | Discharge status: Home / Self Care | Payer: PPO | Source: Ambulatory Visit | Attending: Urology | Admitting: Urology

## 2021-11-20 DIAGNOSIS — C61 Malignant neoplasm of prostate: Secondary | ICD-10-CM | POA: Diagnosis not present

## 2021-11-20 MED ORDER — PIFLIFOLASTAT F 18 (PYLARIFY) INJECTION
9.0000 | Freq: Once | INTRAVENOUS | Status: AC
  Administered 2021-11-20: 9 via INTRAVENOUS

## 2021-11-26 ENCOUNTER — Other Ambulatory Visit: Payer: Self-pay | Admitting: *Deleted

## 2021-11-26 DIAGNOSIS — I4891 Unspecified atrial fibrillation: Secondary | ICD-10-CM

## 2021-11-26 MED ORDER — APIXABAN 5 MG PO TABS: 5.0000 mg | ORAL_TABLET | Freq: Two times a day (BID) | ORAL | 2 refills | Status: DC

## 2021-11-26 NOTE — Telephone Encounter (Signed)
Eliquis '5mg'$  paper refill request received. Patient is 71 years old, weight-89kg, Crea-1.35 on 09/04/21, Diagnosis-Afib, and last seen by Laurann Montana on 09/04/2021. Dose is appropriate based on dosing criteria. Will send in refill to requested pharmacy.

## 2021-12-07 DIAGNOSIS — C61 Malignant neoplasm of prostate: Secondary | ICD-10-CM | POA: Diagnosis not present

## 2021-12-14 ENCOUNTER — Telehealth: Payer: Self-pay | Admitting: Internal Medicine

## 2021-12-14 NOTE — Telephone Encounter (Signed)
[facility] leaving a VM requesting a callback to go over surgical clearance questions. An OV note was received via fax stating they are needing cardiac clearance from Dr.Hilty. Please submit clearance to preop pool upon receiving callback. Faxed notes from [facility] uploaded to media in pt's chart.

## 2021-12-17 DIAGNOSIS — M6281 Muscle weakness (generalized): Secondary | ICD-10-CM | POA: Diagnosis not present

## 2021-12-17 DIAGNOSIS — M6289 Other specified disorders of muscle: Secondary | ICD-10-CM | POA: Diagnosis not present

## 2021-12-18 ENCOUNTER — Other Ambulatory Visit: Payer: Self-pay | Admitting: Urology

## 2021-12-18 ENCOUNTER — Telehealth: Payer: Self-pay | Admitting: Internal Medicine

## 2021-12-18 NOTE — Telephone Encounter (Signed)
   Pre-operative Risk Assessment    Patient Name: Steve Weaver  DOB: 07-23-1950 MRN: [id number]      Request for Surgical Clearance    Procedure:   Robot assisted laparoscopic radical prostatectomy with bilateral pelvic lymphadenectomy  Date of Surgery:  Clearance 01/24/22                                 Surgeon:  Dr. Raynelle Bring Surgeon's Group or Practice Name:  [facility] Phone number:  [phone] Fax number:  [phone]   Type of Clearance Requested:   - Medical  - Pharmacy:  Hold Apixaban (Eliquis) held 3 days prior   Type of Anesthesia:  General    Additional requests/questions:      [name]   12/18/2021, 10:08 AM

## 2021-12-20 NOTE — Telephone Encounter (Signed)
I s/w the pt about tele visit for pre op clearance. Pt tells me that his co-pay was higher for a televisit, than as if he had come to the office. Pt preferred to come in the office is able, if not then he would do tele. Pt agreeable to in office appt 12/27/21 @ 8:50 with Diona Browner, NP at [place] office . Pt thanked me for the help. I will update all parties involved in plan of care.

## 2021-12-20 NOTE — Telephone Encounter (Signed)
Patient with diagnosis of afib on Eliquis for anticoagulation.    Procedure:  Robot assisted laparoscopic radical prostatectomy with bilateral pelvic lymphadenectomy Date of procedure: 01/24/22   CHA2DS2-VASc Score = 2   This indicates a 2.2% annual risk of stroke. The patient's score is based upon: CHF History: 0 HTN History: 1 Diabetes History: 0 Stroke History: 0 Vascular Disease History: 0 Age Score: 1 Gender Score: 0      CrCl 46 ml/min  Per office protocol, patient can hold Eliquis for 3 days prior to procedure.    **This guidance is not considered finalized until pre-operative APP has relayed final recommendations.**

## 2021-12-20 NOTE — Telephone Encounter (Signed)
   Name: Steve Weaver  DOB: 1951-03-27  MRN: [id number]  Primary Cardiologist: Pixie Casino, MD  Chart reviewed as part of pre-operative protocol coverage. Because of [name]'s past medical history and time since last visit, he will require a follow-up tele visit in order to better assess preoperative cardiovascular risk.  Pre-op covering staff: - Please schedule appointment and call patient to inform them. If patient already had an upcoming appointment within acceptable timeframe, please add "pre-op clearance" to the appointment notes so provider is aware. - Please contact requesting surgeon's office via preferred method (i.e, phone, fax) to inform them of need for appointment prior to surgery.  Per office protocol, patient can hold Eliquis for 3 days prior to procedure.    [name], PA-C  12/20/2021, 1:18 PM

## 2021-12-26 DIAGNOSIS — M62838 Other muscle spasm: Secondary | ICD-10-CM | POA: Diagnosis not present

## 2021-12-26 DIAGNOSIS — N393 Stress incontinence (female) (male): Secondary | ICD-10-CM | POA: Diagnosis not present

## 2021-12-26 DIAGNOSIS — M6281 Muscle weakness (generalized): Secondary | ICD-10-CM | POA: Diagnosis not present

## 2021-12-26 NOTE — Progress Notes (Signed)
Office Visit    Patient Name: Steve Weaver Date of Encounter: 12/27/2021  Primary Care Provider:  Haywood Pao, MD Primary Cardiologist:  Pixie Casino, MD  Chief Complaint     71 year old male with a history of permanent atrial fibrillation, aortic valve regurgitation, hypertension, hyperlipidemia, OSA, and prostate cancer who presents for follow-up related to atrial fibrillation and for preoperative cardiac evaluation.  Past Medical History    Past Medical History:  Diagnosis Date   Arthritis    Dysrhythmia    afib   Hyperlipidemia    Hypertension    Longstanding persistent atrial fibrillation [facility])    Past Surgical History:  Procedure Laterality Date   BROKE LEG  11/1998   CARDIOVERSION N/A 06/08/2015   Procedure: CARDIOVERSION;  Surgeon: Pixie Casino, MD;  Location: [place];  Service: Cardiovascular;  Laterality: N/A;   CARDIOVERSION N/A 09/11/2015   Procedure: CARDIOVERSION;  Surgeon: Pixie Casino, MD;  Location: [place];  Service: Cardiovascular;  Laterality: N/A;   NASAL SEPTOPLASTY W/ TURBINOPLASTY Bilateral 12/16/2016   Procedure: NASAL SEPTOPLASTY WITH BILATERAL TURBINATE REDUCTION;  Surgeon: Leta Baptist, MD;  Location: [place];  Service: ENT;  Laterality: Bilateral;   NM MYOCAR PERF WALL MOT[place]  05/04/2013   bruce myoview - normal stress nuclear study; a-flutter & a-fib with RVR, EF 59%   TOTAL HIP ARTHROPLASTY Left 06/14/2020   Procedure: TOTAL HIP ARTHROPLASTY ANTERIOR APPROACH;  Surgeon: Rod Can, MD;  Location: [facility];  Service: Orthopedics;  Laterality: Left;    Allergies  Allergies  Allergen Reactions   Other Rash    Muscle Relaxer (unsure of name)    History of Present Illness    71 year old male with the above past medical history including permanent atrial fibrillation, aortic valve regurgitation, hypertension, hyperlipidemia, OSA, and prostate cancer.   He has followed with Dr. Debara Pickett for many years  for atrial fibrillation.  Cardioversion was attempted twice but due to early recurrence of atrial fibrillation he has been maintained with rate control on Eliquis.  Lexiscan Myoview in 2019 was low risk, negative for ischemia.  He was last seen in the office on 09/04/2021 and was stable from a cardiac standpoint.  He was cleared for prostate biopsy in the setting of elevated PSA.  Repeat echocardiogram showed EF 55 to 60%, no RWMA, mild left atrial dilation, moderate right atrial dilation, mild aortic valve regurgitation, trivial mitral valve regurgitation.  He was diagnosed with prostate cancer.  He presents today for follow-up and for preoperative cardiac evaluation for upcoming robotic assisted laparoscopic radical prostatectomy with bilateral pelvic lymphadenectomy scheduled for 01/24/2022 with Dr. Raynelle Bring of [facility].  Since his last visit is done well from a cardiac standpoint.  He denies any palpitations, dizziness, edema, denies symptoms concerning for angina.  He does note occasional fatigue though he remains active.  Overall, he reports feeling well denies any specific concerns today.  Home Medications    Current Outpatient Medications  Medication Sig Dispense Refill   apixaban (ELIQUIS) 5 MG TABS tablet Take 1 tablet (5 mg total) by mouth 2 (two) times daily. 180 tablet 2   chlorthalidone (HYGROTON) 25 MG tablet TAKE 1/2 TABLET BY MOUTH EVERY DAY 45 tablet 2   Cholecalciferol (VITAMIN D) 50 MCG (2000 UT) tablet Take 2,000 Units by mouth daily.     Coenzyme Q10 (CO Q 10 PO) Take 300 mg by mouth daily.     diltiazem (CARDIZEM CD) 120 MG 24  hr capsule TAKE 1 CAPSULE BY MOUTH EVERY DAY 90 capsule 2   lovastatin (MEVACOR) 40 MG tablet TAKE 1 TABLET BY MOUTH EVERY DAY 90 tablet 3   Magnesium 300 MG CAPS Take 1 capsule by mouth daily at 12 noon.     metoprolol tartrate (LOPRESSOR) 25 MG tablet Take 1 tablet (25 mg total) by mouth 2 (two) times daily. 270 tablet 0   Multiple Vitamin  (MULTIVITAMIN) capsule Take 1 capsule by mouth daily.     Omega-3 Fatty Acids (OMEGA-3 FISH OIL) 1200 MG CAPS Take 1,200 mg by mouth 2 (two) times daily.     Turmeric 500 MG CAPS Take 1 capsule by mouth daily at 12 noon.     zinc gluconate 50 MG tablet Take 50 mg by mouth daily.     No current facility-administered medications for this visit.     Review of Systems    He denies chest pain, palpitations, dyspnea, pnd, orthopnea, n, v, dizziness, syncope, edema, weight gain, or early satiety. All other systems reviewed and are otherwise negative except as noted above.   Physical Exam    VS:  BP 138/72   Pulse 83   Ht '5\' 9"'$  (1.753 m)   Wt 194 lb 3.2 oz (88.1 kg)   SpO2 99%   BMI 28.68 kg/m   GEN: Well nourished, well developed, in no acute distress. HEENT: normal. Neck: Supple, no JVD, carotid bruits, or masses. Cardiac: IRIR, no murmurs, rubs, or gallops. No clubbing, cyanosis, edema.  Radials/DP/PT 2+ and equal bilaterally.  Respiratory:  Respirations regular and unlabored, clear to auscultation bilaterally. GI: Soft, nontender, nondistended, BS + x 4. MS: no deformity or atrophy. Skin: warm and dry, no rash. Neuro:  Strength and sensation are intact. Psych: Normal affect.  Accessory Clinical Findings    ECG personally reviewed by me today - atrial fibrillation, 83 bpm - no acute changes.  Lab Results  Component Value Date   WBC 6.6 09/04/2021   HGB 17.9 (H) 09/04/2021   HCT 51.3 (H) 09/04/2021   MCV 95 09/04/2021   PLT 240 09/04/2021   Lab Results  Component Value Date   CREATININE 1.35 (H) 09/04/2021   BUN 15 09/04/2021   NA 142 09/04/2021   K 4.2 09/04/2021   CL 102 09/04/2021   CO2 18 (L) 09/04/2021   Lab Results  Component Value Date   ALT 32 09/04/2021   AST 34 09/04/2021   ALKPHOS 114 09/04/2021   BILITOT 0.3 09/04/2021   Lab Results  Component Value Date   CHOL 161 07/05/2016   HDL 44 07/05/2016   LDLCALC 84 07/05/2016   TRIG 164 (H) 07/05/2016    CHOLHDL 3.7 07/05/2016    No results found for: "HGBA1C"  Assessment & Plan    1. Permanent atrial fibrillation: Rate controlled, continue Eliquis, diltiazem, metoprolol.  2. Hypertension:BP well controlled. Continue current antihypertensive regimen.   3. Hyperlipidemia: LDL was 100 in November 2022.  Monitor managed per PCP.  Continue lovastatin.  4. Aortic valve regurgitation: Echo in April 2023 showed EF 55 to 60%, no RWMA, mild left atrial dilation, moderate right atrial dilation, mild aortic valve regurgitation, trivial mitral valve regurgitation.  Asymptomatic.  5. OSA: Not on CPAP.  Denies any concerns.  6. Preoperative cardiac exam: According to the Revised Cardiac Risk Index (RCRI), his Perioperative Risk of Major Cardiac Event is (%): 0.4. His Functional Capacity in METs is: 7.99 according to the Duke Activity Status Index (DASI). Therefore, based  on ACC/AHA guidelines, patient would be at acceptable risk for the planned procedure without further cardiovascular testing. Patient with diagnosis of afib on Eliquis for anticoagulation. Per office protocol, patient can hold Eliquis for 3 days prior to procedure.  Please resume Eliquis as soon as possible postprocedure, at the discretion of the surgeon. I will route this recommendation to the requesting party via Epic fax function.  7.  Disposition: Follow-up in 6 months.   Lenna Sciara, NP 12/27/2021, 9:37 AM

## 2021-12-27 ENCOUNTER — Encounter: Payer: Self-pay | Admitting: Nurse Practitioner

## 2021-12-27 ENCOUNTER — Ambulatory Visit: Payer: PPO | Admitting: Nurse Practitioner

## 2021-12-27 VITALS — BP 138/72 | HR 83 | Ht 69.0 in | Wt 194.2 lb

## 2021-12-27 DIAGNOSIS — Z01810 Encounter for preprocedural cardiovascular examination: Secondary | ICD-10-CM

## 2021-12-27 DIAGNOSIS — H25043 Posterior subcapsular polar age-related cataract, bilateral: Secondary | ICD-10-CM | POA: Diagnosis not present

## 2021-12-27 DIAGNOSIS — E782 Mixed hyperlipidemia: Secondary | ICD-10-CM | POA: Diagnosis not present

## 2021-12-27 DIAGNOSIS — Z8679 Personal history of other diseases of the circulatory system: Secondary | ICD-10-CM

## 2021-12-27 DIAGNOSIS — I4821 Permanent atrial fibrillation: Secondary | ICD-10-CM | POA: Diagnosis not present

## 2021-12-27 DIAGNOSIS — I10 Essential (primary) hypertension: Secondary | ICD-10-CM | POA: Diagnosis not present

## 2021-12-27 DIAGNOSIS — G4733 Obstructive sleep apnea (adult) (pediatric): Secondary | ICD-10-CM | POA: Diagnosis not present

## 2021-12-27 DIAGNOSIS — H2513 Age-related nuclear cataract, bilateral: Secondary | ICD-10-CM | POA: Diagnosis not present

## 2021-12-27 NOTE — Patient Instructions (Signed)
Medication Instructions:  Your physician recommends that you continue on your current medications as directed. Please refer to the Current Medication list given to you today.   *If you need a refill on your cardiac medications before your next appointment, please call your pharmacy*   Lab Work: NONE ordered at this time of appointment   If you have labs (blood work) drawn today and your tests are completely normal, you will receive your results only by: [place] (if you have MyChart) OR A paper copy in the mail If you have any lab test that is abnormal or we need to change your treatment, we will call you to review the results.   Testing/Procedures: NONE ordered at this time of appointment     Follow-Up: At [facility], you and your health needs are our priority.  As part of our continuing mission to provide you with exceptional heart care, we have created designated Provider Care Teams.  These Care Teams include your primary Cardiologist (physician) and Advanced Practice Providers (APPs -  Physician Assistants and Nurse Practitioners) who all work together to provide you with the care you need, when you need it.  We recommend signing up for the patient portal called "MyChart".  Sign up information is provided on this After Visit Summary.  MyChart is used to connect with patients for Virtual Visits (Telemedicine).  Patients are able to view lab/test results, encounter notes, upcoming appointments, etc.  Non-urgent messages can be sent to your provider as well.   To learn more about what you can do with MyChart, go to [email or web address].    Your next appointment:   6 month(s)  The format for your next appointment:   In Person  Provider:   Pixie Casino, MD     Other Instructions   Important Information About Sugar

## 2022-01-09 DIAGNOSIS — M62838 Other muscle spasm: Secondary | ICD-10-CM | POA: Diagnosis not present

## 2022-01-09 DIAGNOSIS — N393 Stress incontinence (female) (male): Secondary | ICD-10-CM | POA: Diagnosis not present

## 2022-01-09 DIAGNOSIS — M6281 Muscle weakness (generalized): Secondary | ICD-10-CM | POA: Diagnosis not present

## 2022-01-11 NOTE — Patient Instructions (Addendum)
SURGICAL WAITING ROOM VISITATION Patients having surgery or a procedure may have no more than 2 support people in the waiting area - these visitors may rotate.   Children under the age of 100 must have an adult with them who is not the patient. If the patient needs to stay at the hospital during part of their recovery, the visitor guidelines for inpatient rooms apply. Pre-op nurse will coordinate an appropriate time for 1 support person to accompany patient in pre-op.  This support person may not rotate.    Please refer to the [facility] website for the visitor guidelines for Inpatients (after your surgery is over and you are in a regular room).    Your procedure is scheduled on: 01/19/22   Report to [facility] Main Entrance    Report to admitting at 9:30 AM   Call this number if you have problems the morning of surgery [phone]   Follow clear liquid diet the day before surgery.   You may have the following liquids until 8:30 AM DAY OF SURGERY  Water Non-Citrus Juices (without pulp, NO RED) Carbonated Beverages Black Coffee (NO MILK/CREAM OR CREAMERS, sugar ok)  Clear Tea (NO MILK/CREAM OR CREAMERS, sugar ok) regular and decaf                             Plain Jell-O (NO RED)                                           Fruit ices (not with fruit pulp, NO RED)                                     Popsicles (NO RED)                                                               Sports drinks like Gatorade (NO RED)              FOLLOW BOWEL PREP AND ANY ADDITIONAL PRE OP INSTRUCTIONS YOU RECEIVED FROM YOUR SURGEON'S OFFICE!!!     Oral Hygiene is also important to reduce your risk of infection.                                    Remember - BRUSH YOUR TEETH THE MORNING OF SURGERY WITH YOUR REGULAR TOOTHPASTE   Take these medicines the morning of surgery with A SIP OF WATER: Diltiazem, Metoprolol   These are anesthesia recommendations for holding your anticoagulants.  Please  contact your prescribing physician to confirm IF it is safe to hold your anticoagulants for this length of time.   Eliquis Apixaban   72 hours   Xarelto Rivaroxaban   72 hours  Plavix Clopidogrel   120 hours  Pletal Cilostazol   120 hours  You may not have any metal on your body including jewelry, and body piercing             Do not wear lotions, powders, cologne, or deodorant              Men may shave face and neck.   Do not bring valuables to the hospital. [place].   Bring small overnight bag day of surgery.   DO NOT [place]. PHARMACY WILL DISPENSE MEDICATIONS LISTED ON YOUR MEDICATION LIST TO YOU DURING YOUR ADMISSION [place]!              Please read over the following fact sheets you were given: IF YOU HAVE QUESTIONS ABOUT YOUR PRE-OP INSTRUCTIONS PLEASE CALL [place] - Preparing for Surgery Before surgery, you can play an important role.  Because skin is not sterile, your skin needs to be as free of germs as possible.  You can reduce the number of germs on your skin by washing with CHG (chlorahexidine gluconate) soap before surgery.  CHG is an antiseptic cleaner which kills germs and bonds with the skin to continue killing germs even after washing. Please DO NOT use if you have an allergy to CHG or antibacterial soaps.  If your skin becomes reddened/irritated stop using the CHG and inform your nurse when you arrive at Short Stay. Do not shave (including legs and underarms) for at least 48 hours prior to the first CHG shower.  You may shave your face/neck.  Please follow these instructions carefully:  1.  Shower with CHG Soap the night before surgery and the  morning of surgery.  2.  If you choose to wash your hair, wash your hair first as usual with your normal  shampoo.  3.  After you shampoo, rinse your hair and body  thoroughly to remove the shampoo.                             4.  Use CHG as you would any other liquid soap.  You can apply chg directly to the skin and wash.  Gently with a scrungie or clean washcloth.  5.  Apply the CHG Soap to your body ONLY FROM THE NECK DOWN.   Do   not use on face/ open                           Wound or open sores. Avoid contact with eyes, ears mouth and   genitals (private parts).                       Wash face,  Genitals (private parts) with your normal soap.             6.  Wash thoroughly, paying special attention to the area where your    surgery  will be performed.  7.  Thoroughly rinse your body with warm water from the neck down.  8.  DO NOT shower/wash with your normal soap after using and rinsing off the CHG Soap.                9.  Pat yourself dry with a clean towel.  10.  Wear clean pajamas.            11.  Place clean sheets on your bed the night of your first shower and do not  sleep with pets. Day of Surgery : Do not apply any lotions/deodorants the morning of surgery.  Please wear clean clothes to the hospital/surgery center.  FAILURE TO FOLLOW THESE INSTRUCTIONS MAY RESULT IN THE CANCELLATION OF YOUR SURGERY  PATIENT SIGNATURE_________________________________  NURSE SIGNATURE__________________________________  ________________________________________________________________________   Adam Phenix  An incentive spirometer is a tool that can help keep your lungs clear and active. This tool measures how well you are filling your lungs with each breath. Taking long deep breaths may help reverse or decrease the chance of developing breathing (pulmonary) problems (especially infection) following: A long period of time when you are unable to move or be active. BEFORE THE PROCEDURE  If the spirometer includes an indicator to show your best effort, your nurse or respiratory therapist will set it to a desired goal. If possible, sit up  straight or lean slightly forward. Try not to slouch. Hold the incentive spirometer in an upright position. INSTRUCTIONS FOR USE  Sit on the edge of your bed if possible, or sit up as far as you can in bed or on a chair. Hold the incentive spirometer in an upright position. Breathe out normally. Place the mouthpiece in your mouth and seal your lips tightly around it. Breathe in slowly and as deeply as possible, raising the piston or the ball toward the top of the column. Hold your breath for 3-5 seconds or for as long as possible. Allow the piston or ball to fall to the bottom of the column. Remove the mouthpiece from your mouth and breathe out normally. Rest for a few seconds and repeat Steps 1 through 7 at least 10 times every 1-2 hours when you are awake. Take your time and take a few normal breaths between deep breaths. The spirometer may include an indicator to show your best effort. Use the indicator as a goal to work toward during each repetition. After each set of 10 deep breaths, practice coughing to be sure your lungs are clear. If you have an incision (the cut made at the time of surgery), support your incision when coughing by placing a pillow or rolled up towels firmly against it. Once you are able to get out of bed, walk around indoors and cough well. You may stop using the incentive spirometer when instructed by your caregiver.  RISKS AND COMPLICATIONS Take your time so you do not get dizzy or light-headed. If you are in pain, you may need to take or ask for pain medication before doing incentive spirometry. It is harder to take a deep breath if you are having pain. AFTER USE Rest and breathe slowly and easily. It can be helpful to keep track of a log of your progress. Your caregiver can provide you with a simple table to help with this. If you are using the spirometer at home, follow these instructions: [place] IF:  You are having difficultly using the spirometer. You  have trouble using the spirometer as often as instructed. Your pain medication is not giving enough relief while using the spirometer. You develop fever of 100.5 F (38.1 C) or higher. SEEK IMMEDIATE MEDICAL CARE IF:  You cough up bloody sputum that had not been present before. You develop fever of 102 F (38.9 C) or greater. You develop worsening pain at  or near the incision site. MAKE SURE YOU:  Understand these instructions. Will watch your condition. Will get help right away if you are not doing well or get worse. Document Released: 10/07/2006 Document Revised: 08/19/2011 Document Reviewed: 12/08/2006 ExitCare Patient Information 2014 ExitCare, [place].   ________________________________________________________________________  WHAT IS A BLOOD TRANSFUSION? Blood Transfusion Information  A transfusion is the replacement of blood or some of its parts. Blood is made up of multiple cells which provide different functions. Red blood cells carry oxygen and are used for blood loss replacement. White blood cells fight against infection. Platelets control bleeding. Plasma helps clot blood. Other blood products are available for specialized needs, such as hemophilia or other clotting disorders. BEFORE THE TRANSFUSION  Who gives blood for transfusions?  Healthy volunteers who are fully evaluated to make sure their blood is safe. This is blood bank blood. Transfusion therapy is the safest it has ever been in the practice of medicine. Before blood is taken from a donor, a complete history is taken to make sure that person has no history of diseases nor engages in risky social behavior (examples are intravenous drug use or sexual activity with multiple partners). The donor's travel history is screened to minimize risk of transmitting infections, such as malaria. The donated blood is tested for signs of infectious diseases, such as HIV and hepatitis. The blood is then tested to be sure it is  compatible with you in order to minimize the chance of a transfusion reaction. If you or a relative donates blood, this is often done in anticipation of surgery and is not appropriate for emergency situations. It takes many days to process the donated blood. RISKS AND COMPLICATIONS Although transfusion therapy is very safe and saves many lives, the main dangers of transfusion include:  Getting an infectious disease. Developing a transfusion reaction. This is an allergic reaction to something in the blood you were given. Every precaution is taken to prevent this. The decision to have a blood transfusion has been considered carefully by your caregiver before blood is given. Blood is not given unless the benefits outweigh the risks. AFTER THE TRANSFUSION Right after receiving a blood transfusion, you will usually feel much better and more energetic. This is especially true if your red blood cells have gotten low (anemic). The transfusion raises the level of the red blood cells which carry oxygen, and this usually causes an energy increase. The nurse administering the transfusion will monitor you carefully for complications. HOME CARE INSTRUCTIONS  No special instructions are needed after a transfusion. You may find your energy is better. Speak with your caregiver about any limitations on activity for underlying diseases you may have. SEEK MEDICAL CARE IF:  Your condition is not improving after your transfusion. You develop redness or irritation at the intravenous (IV) site. SEEK IMMEDIATE MEDICAL CARE IF:  Any of the following symptoms occur over the next 12 hours: Shaking chills. You have a temperature by mouth above 102 F (38.9 C), not controlled by medicine. Chest, back, or muscle pain. People around you feel you are not acting correctly or are confused. Shortness of breath or difficulty breathing. Dizziness and fainting. You get a rash or develop hives. You have a decrease in urine  output. Your urine turns a dark color or changes to pink, red, or brown. Any of the following symptoms occur over the next 10 days: You have a temperature by mouth above 102 F (38.9 C), not controlled by medicine. Shortness of breath. Weakness  after normal activity. The white part of the eye turns yellow (jaundice). You have a decrease in the amount of urine or are urinating less often. Your urine turns a dark color or changes to pink, red, or brown. Document Released: 05/24/2000 Document Revised: 08/19/2011 Document Reviewed: 01/11/2008 [facility] Patient Information 2014 [place], [place].  _______________________________________________________________________

## 2022-01-11 NOTE — Progress Notes (Addendum)
COVID Vaccine Completed: yes x3  Date of COVID positive in last 90 days: no  PCP - Domenick Gong, MD Cardiologist - Lyman Bishop, MD  Cardiac clearance 12/27/21 by Diona Browner in Epic  Chest x-ray - n/a EKG - 12/27/21 Epic Stress Test - 02/04/18 Epic ECHO - 09/17/21 Epic Cardiac Cath - n/a Pacemaker/ICD device last checked: n/a Spinal Cord Stimulator:n/a  Bowel Prep - clears day before, miralax, fleet. Patient has instructions  Sleep Study - yes,  CPAP - no  Fasting Blood Sugar - n/a Checks Blood Sugar _____ times a day  Blood Thinner Instructions: Eliquis, hold 3 days Aspirin Instructions: Last Dose: 01/20/22 at 1900  Activity level: Can go up a flight of stairs and perform activities of daily living without stopping and without symptoms of chest pain or shortness of breath.   Anesthesia review: a fib, HTN, PAC, OSA, mild aortic valve regurgitation   Patient denies shortness of breath, fever, cough and chest pain at PAT appointment  Patient verbalized understanding of instructions that were given to them at the PAT appointment. Patient was also instructed that they will need to review over the PAT instructions again at home before surgery.

## 2022-01-14 ENCOUNTER — Encounter (HOSPITAL_COMMUNITY)
Admission: RE | Admit: 2022-01-14 | Discharge: 2022-01-14 | Disposition: A | Discharge status: Home / Self Care | Payer: PPO | Source: Ambulatory Visit | Attending: Urology | Admitting: Urology

## 2022-01-14 ENCOUNTER — Encounter (HOSPITAL_COMMUNITY): Payer: Self-pay

## 2022-01-14 DIAGNOSIS — I10 Essential (primary) hypertension: Secondary | ICD-10-CM | POA: Diagnosis not present

## 2022-01-14 DIAGNOSIS — I4821 Permanent atrial fibrillation: Secondary | ICD-10-CM | POA: Diagnosis not present

## 2022-01-14 DIAGNOSIS — C61 Malignant neoplasm of prostate: Secondary | ICD-10-CM | POA: Diagnosis not present

## 2022-01-14 DIAGNOSIS — I351 Nonrheumatic aortic (valve) insufficiency: Secondary | ICD-10-CM | POA: Insufficient documentation

## 2022-01-14 DIAGNOSIS — Z01812 Encounter for preprocedural laboratory examination: Secondary | ICD-10-CM | POA: Diagnosis not present

## 2022-01-14 DIAGNOSIS — Z7901 Long term (current) use of anticoagulants: Secondary | ICD-10-CM | POA: Diagnosis not present

## 2022-01-14 DIAGNOSIS — G4733 Obstructive sleep apnea (adult) (pediatric): Secondary | ICD-10-CM | POA: Insufficient documentation

## 2022-01-14 HISTORY — DX: Malignant (primary) neoplasm, unspecified: C80.1

## 2022-01-14 LAB — BASIC METABOLIC PANEL
Anion gap: 11 (ref 5–15)
BUN: 20 mg/dL (ref 8–23)
CO2: 22 mmol/L (ref 22–32)
Calcium: 9.4 mg/dL (ref 8.9–10.3)
Chloride: 105 mmol/L (ref 98–111)
Creatinine, Ser: 1.24 mg/dL (ref 0.61–1.24)
GFR, Estimated: >60 mL/min (ref >60)
Glucose, Bld: 126 mg/dL — ABNORMAL HIGH (ref 70–99)
Potassium: 3.6 mmol/L (ref 3.5–5.1)
Sodium: 138 mmol/L (ref 135–145)

## 2022-01-14 LAB — CBC
HCT: 49.7 % (ref 39.0–52.0)
Hemoglobin: 17.9 g/dL — ABNORMAL HIGH (ref 13.0–17.0)
MCH: 34.1 pg — ABNORMAL HIGH (ref 26.0–34.0)
MCHC: 36 g/dL (ref 30.0–36.0)
MCV: 94.7 fL (ref 80.0–100.0)
Platelets: 261 10*3/uL (ref 150–400)
RBC: 5.25 MIL/uL (ref 4.22–5.81)
RDW: 12 % (ref 11.5–15.5)
WBC: 6.7 10*3/uL (ref 4.0–10.5)
nRBC: 0 % (ref 0.0–0.2)

## 2022-01-15 DIAGNOSIS — C61 Malignant neoplasm of prostate: Secondary | ICD-10-CM | POA: Diagnosis not present

## 2022-01-15 NOTE — Progress Notes (Signed)
Anesthesia Chart Review:   Case: [id number] Date/Time: 01/24/22 1115   Procedure: XI ROBOTIC ASSISTED LAPAROSCOPIC RADICAL PROSTATECTOMY LEVEL 2   Anesthesia type: General   Pre-op diagnosis: PROSTATE CANCER   Location: [facility] ROOM 03 / [facility]   Surgeons: Raynelle Bring, MD       DISCUSSION: Pt is 71 years old with hx permanent atrial fibrillation, HTN, OSA  Pt to hold eliquis 3 days before surgery   VS: BP (!) 149/89   Pulse 69   Temp 36.7 C (Oral)   Resp 12   Ht '5\' 8"'$  (1.727 m)   Wt 87.5 kg   SpO2 97%   BMI 29.35 kg/m   PROVIDERS: - PCP is Tisovec, Fransico Him, MD - Cardiologist is Lyman Bishop, MD. Cleared for surgery at last office visit 12/27/21 with Diona Browner, NP   LABS: Labs reviewed: Acceptable for surgery. (all labs ordered are listed, but only abnormal results are displayed)  Labs Reviewed  CBC - Abnormal; Notable for the following components:      Result Value   Hemoglobin 17.9 (*)    MCH 34.1 (*)    All other components within normal limits  BASIC METABOLIC PANEL - Abnormal; Notable for the following components:   Glucose, Bld 126 (*)    All other components within normal limits  TYPE AND SCREEN    EKG 12/27/21: atrial fibrillation   CV: Echo 09/17/21:  1. Left ventricular ejection fraction, by estimation, is 55 to 60%. Left ventricular ejection fraction by 3D volume is 58 %. The left ventricle has normal function. The left ventricle has no regional wall motion abnormalities. Left ventricular diastolic function could not be evaluated.  2. Right ventricular systolic function is normal. The right ventricular size is normal.  3. Left atrial size was mildly dilated.  4. Right atrial size was moderately dilated.  5. The mitral valve is normal in structure. Trivial mitral valve regurgitation.  6. The aortic valve is normal in structure. Aortic valve regurgitation is mild. No aortic stenosis is present.  - Comparison(s): EF 55%, mild LVH, mild AI  Nuclear  stress test 02/04/18:  There was no ST segment deviation noted during stress. LV function is not available because the study was not gated ( atrial fib , PVCs) The study is normal. no ischemia. No evidence of previous infarction This is a low risk study.   Past Medical History:  Diagnosis Date   Arthritis    [facility] [address])    prostate   Dysrhythmia    afib   Hyperlipidemia    Hypertension    Longstanding persistent atrial fibrillation [facility])     Past Surgical History:  Procedure Laterality Date   BROKE LEG  11/1998   CARDIOVERSION N/A 06/08/2015   Procedure: CARDIOVERSION;  Surgeon: Pixie Casino, MD;  Location: [place];  Service: Cardiovascular;  Laterality: N/A;   CARDIOVERSION N/A 09/11/2015   Procedure: CARDIOVERSION;  Surgeon: Pixie Casino, MD;  [facility] ENDOSCOPY;  Service: Cardiovascular;  Laterality: N/A;   NASAL SEPTOPLASTY W/ TURBINOPLASTY Bilateral 12/16/2016   Procedure: NASAL SEPTOPLASTY WITH BILATERAL TURBINATE REDUCTION;  Surgeon: Leta Baptist, MD;  Location: [place];  Service: ENT;  Laterality: Bilateral;   NM MYOCAR PERF WALL MOTION  05/04/2013   bruce myoview - normal stress nuclear study; a-flutter & a-fib with RVR, EF 59%   TOTAL HIP ARTHROPLASTY Left 06/14/2020   Procedure: TOTAL HIP ARTHROPLASTY ANTERIOR APPROACH;  Surgeon: Rod Can, MD;  Location: [facility];  Service: Orthopedics;  Laterality: Left;    MEDICATIONS:  apixaban (ELIQUIS) 5 MG TABS tablet   chlorthalidone (HYGROTON) 25 MG tablet   Cholecalciferol (VITAMIN D) 50 MCG (2000 UT) tablet   Coenzyme Q10 (CO Q 10 PO)   diltiazem (CARDIZEM CD) 120 MG 24 hr capsule   lovastatin (MEVACOR) 40 MG tablet   Magnesium 300 MG CAPS   metoprolol tartrate (LOPRESSOR) 25 MG tablet   Multiple Vitamin (MULTIVITAMIN) capsule   Omega-3 Fatty Acids (OMEGA-3 FISH OIL) 1200 MG CAPS   Turmeric 500 MG CAPS   zinc gluconate 50 MG tablet   No current facility-administered medications for this  encounter.    If no changes, I anticipate pt can proceed with surgery as scheduled.   Willeen Cass, PhD, FNP-BC [facility] Short Stay Surgical Center/Anesthesiology Phone: [phone] 01/15/2022 2:28 PM

## 2022-01-15 NOTE — Anesthesia Preprocedure Evaluation (Addendum)
Anesthesia Evaluation  Patient identified by MRN, date of birth, ID band Patient awake    Reviewed: Allergy & Precautions, NPO status , Patient's Chart, lab work & pertinent test results  Airway Mallampati: II  TM Distance: >3 FB Neck ROM: Full    Dental no notable dental hx. (+) Teeth Intact, Dental Advisory Given   Pulmonary sleep apnea , former smoker,    Pulmonary exam normal breath sounds clear to auscultation       Cardiovascular hypertension, Normal cardiovascular exam+ dysrhythmias Atrial Fibrillation  Rhythm:Irregular Rate:Abnormal  09/17/2021 TEE  1. Left ventricular ejection fraction, by estimation, is 55 to 60%. Left  ventricular ejection fraction by 3D volume is 58 %. The left ventricle has  normal function. The left ventricle has no regional wall motion  abnormalities. Left ventricular diastolic  function could not be evaluated.  2. Right ventricular systolic function is normal. The right ventricular  size is normal.  3. Left atrial size was mildly dilated.  4. Right atrial size was moderately dilated.  5. The mitral valve is normal in structure. Trivial mitral valve  regurgitation.  6. The aortic valve is normal in structure. Aortic valve regurgitation is  mild. No aortic stenosis is present.    Neuro/Psych    GI/Hepatic Neg liver ROS,   Endo/Other  negative endocrine ROS  Renal/GU negative Renal ROS     Musculoskeletal   Abdominal   Peds  Hematology Lab Results      Component                Value               Date                      WBC                      6.7                 01/14/2022                HGB                      17.9 (H)            01/14/2022                HCT                      49.7                01/14/2022                MCV                      94.7                01/14/2022                PLT                      261                 01/14/2022               Anesthesia Other Findings   Reproductive/Obstetrics  Anesthesia Physical Anesthesia Plan  ASA: 3  Anesthesia Plan: General   Post-op Pain Management: Lidocaine infusion*, Ofirmev IV (intra-op)* and Ketamine IV*   Induction: Intravenous  PONV Risk Score and Plan: Treatment may vary due to age or medical condition, Ondansetron, Dexamethasone and Midazolam  Airway Management Planned: Oral ETT  Additional Equipment: None  Intra-op Plan:   Post-operative Plan: Extubation in OR  Informed Consent: I have reviewed the patients History and Physical, chart, labs and discussed the procedure including the risks, benefits and alternatives for the proposed anesthesia with the patient or authorized representative who has indicated his/her understanding and acceptance.     Dental advisory given  Plan Discussed with:   Anesthesia Plan Comments: (See APP note by Durel Salts, FNP )      Anesthesia Quick Evaluation

## 2022-01-23 NOTE — H&P (Signed)
Office Visit Report     01/15/2022   --------------------------------------------------------------------------------   Steve Weaver  MRN: [id number]  DOB: 10-29-1950, 71 year old Male  SSN:    PRIMARY CARE:  Steve Pao, MD  REFERRING:  Steve Weaver, Steve Weaver  PROVIDER:  Link Weaver, III, M.D.  TREATING:  Steve Weaver, [place]  LOCATION:  [facility] [phone]     --------------------------------------------------------------------------------   CC/HPI: Pt presents today for pre-operative history and physical exam in anticipation of robotic assisted lap radical prostatectomy with bilateral pelvic lymph node dissection by Dr. Alinda Weaver on 01/24/22.   Received cardiac clearance from Steve Weaver's office. He can stop Eliquis 3 days prior to surgery.   Pt denies F/C, HA, CP, SOB, N/V, diarrhea/constipation, back pain, flank pain, hematuria, and dysuria.     HX:   CC: Prostate Cancer   Physician requesting consult: Dr. Alen Weaver  PCP: Dr. Domenick Weaver   Steve Weaver is a 71 year old gentleman gentleman who was noted to have a rising PSA up to 3.75. He was evaluated by Dr. Gloriann Weaver and also noted to have right sided prostate induration. He underwent a TRUS biopsy of the prostate on 11/02/21. This demonstrated Gleason 4+4=8 adenocarcinoma of the prostate with 6 out of 12 biopsy cores (all on the right) positive for malignancy. He underwent a PSMA PET scan on 11/20/21 that was negative for metastatic disease with uptake noted only on the right side of the prostate.   Family history: None.   Imaging studies: PSMA PET scan (11/20/21): Negative for metastatic disease.   PMH: He has a history of atrial fibrillation (Eliquis), hypertension, and hyperlipidemia.  PSH: No abdominal surgeries.   TNM stage: cT2b vs T3a N0 M0 (right induration)  PSA: 3.75  Gleason score: 4+4=8 ( GG 4)  Biopsy (11/02/21): 6/12 cores positive  Left: Benign  Right: R apex (60%, 4+3=7, PNI), R  lateral apex (20%, 4+4=8, PNI), R mid (80%, 4+3=7, PNI), R lateral mid (50%, 4+3=7, PNI), R base (80%, 4+3=7, PNI), R lateral base (40%, 4+3=7, PNI)  Prostate volume: 19.0 cc   Nomogram  OC disease: 37%  EPE: 62%  SVI: 15%  LNI: 16%  PFS (5 year, 10 year): 52%, 36%   Urinary function: IPSS is 8.  Erectile function: SHIM score is 11. This is not a high priority for him at this point in his life.     ALLERGIES: No Known Drug Allergies    MEDICATIONS: Metoprolol Tartrate  Chlorthalidone 25 mg tablet  Coq10  Diltiazem 12Hr Er 120 mg capsule, extended release 12 hr  Eliquis 5 mg tablet  Fish Oil  Lovastatin  Magnesium  Multivitamin  Vitamin D3  Zinc     GU PSH: Prostate Needle Biopsy - 11/02/2021       PSH Notes: right tib fib fracture and repair 2000  Vasectomy 20 years ago  septoplasty 2018     NON-GU PSH: Hip Replacement, Left Surgical Pathology, Gross And Microscopic Examination For Prostate Needle - 11/02/2021     GU PMH: Stress Incontinence - 01/09/2022, - 12/26/2021 Prostate Cancer - 12/17/2021, - 12/07/2021, - 11/09/2021 ED due to arterial insufficiency - 11/09/2021, - 07/06/2021 Prostate nodule w/o LUTS - 11/09/2021, - 11/02/2021, - 07/06/2021 Encounter for Prostate Cancer screening - 07/06/2021    NON-GU PMH: Muscle weakness (generalized) - 01/09/2022, - 12/26/2021, - 12/17/2021 Other muscle spasm - 01/09/2022, - 12/26/2021 Other specified disorders of muscle - 12/17/2021 Atrial Fibrillation Hypercholesterolemia Hypertension  FAMILY HISTORY: Diabetes - Mother   SOCIAL HISTORY: Marital Status: Married Current Smoking Status: Patient has never smoked.   Tobacco Use Assessment Completed: Used Tobacco in last 30 days? Does not use smokeless tobacco. Does drink.  Does not use drugs. Drinks 1 caffeinated drink per day. Has not had a blood transfusion.     Notes: Liquor 1 drink per day most days of the week    REVIEW OF SYSTEMS:    GU Review Male:   Patient denies  erection problems, have to strain to urinate , leakage of urine, penile pain, trouble starting your stream, get up at night to urinate, stream starts and stops, frequent urination, burning/ pain with urination, and hard to postpone urination.  Gastrointestinal (Upper):   Patient denies nausea, vomiting, and indigestion/ heartburn.  Gastrointestinal (Lower):   Patient denies diarrhea and constipation.  Constitutional:   Patient denies fever, night sweats, weight loss, and fatigue.  Skin:   Patient denies skin rash/ lesion.  Eyes:   Patient denies blurred vision and double vision.  Ears/ Nose/ Throat:   Patient denies sore throat and sinus problems.  Hematologic/Lymphatic:   Patient denies swollen glands and easy bruising.  Cardiovascular:   Patient denies leg swelling and chest pains.  Respiratory:   Patient denies cough and shortness of breath.  Endocrine:   Patient denies excessive thirst.  Musculoskeletal:   Patient denies back pain and joint pain.  Neurological:   Patient denies headaches and dizziness.  Psychologic:   Patient denies depression and anxiety.   VITAL SIGNS:      01/15/2022 02:57 PM  Weight 190 lb / 86.18 kg  Height 68 in / 172.72 cm  BP 143/83 mmHg  Pulse 79 /min  Temperature 97.3 F / 36.2 C  BMI 28.9 kg/m   Complexity of Data:  Records Review:   Previous Patient Records  Urine Test Review:   Urinalysis   01/15/22  Urinalysis  Urine Appearance Clear   Urine Color Yellow   Urine Glucose Neg mg/dL  Urine Bilirubin Neg mg/dL  Urine Ketones Neg mg/dL  Urine Specific Gravity 1.020   Urine Blood Neg ery/uL  Urine pH 6.0   Urine Protein Neg mg/dL  Urine Urobilinogen 0.2 mg/dL  Urine Nitrites Neg   Urine Leukocyte Esterase Neg leu/uL   PROCEDURES:          Urinalysis - 81003 Dipstick Dipstick Cont'd  Color: Yellow Bilirubin: Neg mg/dL  Appearance: Clear Ketones: Neg mg/dL  Specific Gravity: 1.020 Blood: Neg ery/uL  pH: 6.0 Protein: Neg mg/dL  Glucose: Neg  mg/dL Urobilinogen: 0.2 mg/dL    Nitrites: Neg    Leukocyte Esterase: Neg leu/uL    ASSESSMENT:      ICD-10 Details  1 GU:   Prostate Cancer - C61      PLAN:           Schedule Return Visit/Planned Activity: Keep Scheduled Appointment - Schedule Surgery          Document Letter(s):  Created for Patient: Clinical Summary         Notes:   There are no changes in the patients history or physical exam since last evaluation by Dr. Alinda Weaver. Pt is scheduled to undergo RALP with BPLND on 01/24/22.   All pt's questions were answered to the best of my ability.          Next Appointment:      Next Appointment: 01/24/2022 11:30 AM    Appointment Type: Surgery  Location: [facility] [phone]    Provider: Raynelle Weaver, M.D.    Reason for Visit: WL/EXT REC RA LAP RAD PROSTATECTOMY LEVEL 2, BPLA WITH AMANDA      * Signed by Mcarthur Rossetti, PA on 01/15/22 at 4:25 PM (EDT*      The information contained in this medical record document is considered private and confidential patient information. This information

## 2022-01-24 ENCOUNTER — Observation Stay (HOSPITAL_COMMUNITY)
Admission: RE | Admit: 2022-01-24 | Discharge: 2022-01-25 | Disposition: A | Discharge status: Home / Self Care | Payer: PPO | Source: Ambulatory Visit | Attending: Urology | Admitting: Urology

## 2022-01-24 ENCOUNTER — Encounter (HOSPITAL_COMMUNITY)
Admission: RE | Disposition: A | Discharge status: Home / Self Care | Payer: Self-pay | Source: Ambulatory Visit | Attending: Urology

## 2022-01-24 ENCOUNTER — Encounter (HOSPITAL_COMMUNITY): Payer: Self-pay | Admitting: Urology

## 2022-01-24 ENCOUNTER — Ambulatory Visit (HOSPITAL_COMMUNITY): Payer: PPO | Admitting: Emergency Medicine

## 2022-01-24 ENCOUNTER — Other Ambulatory Visit: Payer: Self-pay

## 2022-01-24 ENCOUNTER — Ambulatory Visit (HOSPITAL_BASED_OUTPATIENT_CLINIC_OR_DEPARTMENT_OTHER): Payer: PPO | Admitting: Certified Registered Nurse Anesthetist

## 2022-01-24 DIAGNOSIS — I10 Essential (primary) hypertension: Secondary | ICD-10-CM | POA: Diagnosis not present

## 2022-01-24 DIAGNOSIS — Z87891 Personal history of nicotine dependence: Secondary | ICD-10-CM

## 2022-01-24 DIAGNOSIS — Z7901 Long term (current) use of anticoagulants: Secondary | ICD-10-CM | POA: Diagnosis not present

## 2022-01-24 DIAGNOSIS — D360 Benign neoplasm of lymph nodes: Secondary | ICD-10-CM | POA: Insufficient documentation

## 2022-01-24 DIAGNOSIS — C61 Malignant neoplasm of prostate: Principal | ICD-10-CM | POA: Insufficient documentation

## 2022-01-24 DIAGNOSIS — I4891 Unspecified atrial fibrillation: Secondary | ICD-10-CM

## 2022-01-24 HISTORY — PX: ROBOT ASSISTED LAPAROSCOPIC RADICAL PROSTATECTOMY: SHX5141

## 2022-01-24 LAB — HEMOGLOBIN AND HEMATOCRIT, BLOOD
HCT: 48.4 % (ref 39.0–52.0)
Hemoglobin: 17.1 g/dL — ABNORMAL HIGH (ref 13.0–17.0)

## 2022-01-24 LAB — TYPE AND SCREEN
ABO/RH(D): O POS
Antibody Screen: NEGATIVE

## 2022-01-24 SURGERY — XI ROBOTIC ASSISTED LAPAROSCOPIC RADICAL PROSTATECTOMY LEVEL 2
Anesthesia: General | Site: Abdomen

## 2022-01-24 MED ORDER — ONDANSETRON HCL 4 MG/2ML IJ SOLN
INTRAMUSCULAR | Status: AC
  Filled 2022-01-24: qty 2

## 2022-01-24 MED ORDER — FENTANYL CITRATE (PF) 250 MCG/5ML IJ SOLN
INTRAMUSCULAR | Status: AC
  Filled 2022-01-24: qty 5

## 2022-01-24 MED ORDER — ACETAMINOPHEN 10 MG/ML IV SOLN
INTRAVENOUS | Status: AC
  Filled 2022-01-24: qty 100

## 2022-01-24 MED ORDER — MORPHINE SULFATE (PF) 2 MG/ML IV SOLN: 2.0000 mg | INTRAVENOUS | Status: DC | PRN

## 2022-01-24 MED ORDER — STERILE WATER FOR IRRIGATION IR SOLN
Status: DC | PRN
  Administered 2022-01-24: 1000 mL

## 2022-01-24 MED ORDER — ONDANSETRON HCL 4 MG/2ML IJ SOLN
4.0000 mg | Freq: Once | INTRAMUSCULAR | Status: DC | PRN
Start: 2022-01-24 — End: 2022-01-24

## 2022-01-24 MED ORDER — DOCUSATE SODIUM 100 MG PO CAPS: 100.0000 mg | ORAL_CAPSULE | Freq: Two times a day (BID) | ORAL | Status: DC

## 2022-01-24 MED ORDER — KCL IN DEXTROSE-NACL 20-5-0.45 MEQ/L-%-% IV SOLN
INTRAVENOUS | Status: DC
Start: 2022-01-24 — End: 2022-01-25
  Filled 2022-01-24 (×3): qty 1000

## 2022-01-24 MED ORDER — MIDAZOLAM HCL 2 MG/2ML IJ SOLN
INTRAMUSCULAR | Status: AC
  Filled 2022-01-24: qty 2

## 2022-01-24 MED ORDER — DIPHENHYDRAMINE HCL 50 MG/ML IJ SOLN: 12.5000 mg | Freq: Four times a day (QID) | INTRAMUSCULAR | Status: DC | PRN

## 2022-01-24 MED ORDER — HEPARIN SODIUM (PORCINE) 1000 UNIT/ML IJ SOLN
INTRAMUSCULAR | Status: AC
  Filled 2022-01-24: qty 1

## 2022-01-24 MED ORDER — ACETAMINOPHEN 10 MG/ML IV SOLN
INTRAVENOUS | Status: DC | PRN
  Administered 2022-01-24: 1000 mg via INTRAVENOUS

## 2022-01-24 MED ORDER — DOCUSATE SODIUM 100 MG PO CAPS
100.0000 mg | ORAL_CAPSULE | Freq: Two times a day (BID) | ORAL | Status: DC
  Administered 2022-01-24 – 2022-01-25 (×2): 100 mg via ORAL
  Filled 2022-01-24 (×2): qty 1

## 2022-01-24 MED ORDER — ORAL CARE MOUTH RINSE: 15.0000 mL | Freq: Once | OROMUCOSAL | Status: AC

## 2022-01-24 MED ORDER — DEXAMETHASONE SODIUM PHOSPHATE 4 MG/ML IJ SOLN
INTRAMUSCULAR | Status: DC | PRN
  Administered 2022-01-24: 5 mg via INTRAVENOUS

## 2022-01-24 MED ORDER — CHLORHEXIDINE GLUCONATE 0.12 % MT SOLN
15.0000 mL | Freq: Once | OROMUCOSAL | Status: AC
  Administered 2022-01-24: 15 mL via OROMUCOSAL

## 2022-01-24 MED ORDER — METOPROLOL TARTRATE 25 MG PO TABS
25.0000 mg | ORAL_TABLET | Freq: Two times a day (BID) | ORAL | Status: DC
  Administered 2022-01-24 – 2022-01-25 (×2): 25 mg via ORAL
  Filled 2022-01-24 (×2): qty 1

## 2022-01-24 MED ORDER — KETAMINE HCL 10 MG/ML IJ SOLN
INTRAMUSCULAR | Status: DC | PRN
  Administered 2022-01-24: 20 mg via INTRAVENOUS
  Administered 2022-01-24: 30 mg via INTRAVENOUS

## 2022-01-24 MED ORDER — DILTIAZEM HCL ER COATED BEADS 120 MG PO CP24
120.0000 mg | ORAL_CAPSULE | Freq: Every day | ORAL | Status: DC
  Administered 2022-01-25: 120 mg via ORAL
  Filled 2022-01-24: qty 1

## 2022-01-24 MED ORDER — LACTATED RINGERS IV SOLN
INTRAVENOUS | Status: DC | PRN
  Administered 2022-01-24: 1000 mL

## 2022-01-24 MED ORDER — LACTATED RINGERS IV SOLN: INTRAVENOUS | Status: DC

## 2022-01-24 MED ORDER — PROPOFOL 500 MG/50ML IV EMUL
INTRAVENOUS | Status: AC
  Filled 2022-01-24: qty 50

## 2022-01-24 MED ORDER — PROPOFOL 500 MG/50ML IV EMUL
INTRAVENOUS | Status: DC | PRN
  Administered 2022-01-24: 25 ug/kg/min via INTRAVENOUS

## 2022-01-24 MED ORDER — KETAMINE HCL 50 MG/5ML IJ SOSY
PREFILLED_SYRINGE | INTRAMUSCULAR | Status: AC
  Filled 2022-01-24: qty 5

## 2022-01-24 MED ORDER — DEXAMETHASONE SODIUM PHOSPHATE 10 MG/ML IJ SOLN
INTRAMUSCULAR | Status: AC
  Filled 2022-01-24: qty 1

## 2022-01-24 MED ORDER — FENTANYL CITRATE PF 50 MCG/ML IJ SOSY
25.0000 ug | PREFILLED_SYRINGE | INTRAMUSCULAR | Status: DC | PRN
  Administered 2022-01-24: 50 ug via INTRAVENOUS

## 2022-01-24 MED ORDER — LIDOCAINE HCL (PF) 2 % IJ SOLN
INTRAMUSCULAR | Status: DC | PRN
  Administered 2022-01-24: 1.5 mg/kg/h

## 2022-01-24 MED ORDER — KETOROLAC TROMETHAMINE 15 MG/ML IJ SOLN
15.0000 mg | Freq: Four times a day (QID) | INTRAMUSCULAR | Status: DC
  Administered 2022-01-24 – 2022-01-25 (×4): 15 mg via INTRAVENOUS
  Filled 2022-01-24 (×4): qty 1

## 2022-01-24 MED ORDER — ACETAMINOPHEN 325 MG PO TABS: 650.0000 mg | ORAL_TABLET | ORAL | Status: DC | PRN

## 2022-01-24 MED ORDER — CEFAZOLIN SODIUM-DEXTROSE 2-4 GM/100ML-% IV SOLN
2.0000 g | Freq: Once | INTRAVENOUS | Status: AC
  Administered 2022-01-24: 2 g via INTRAVENOUS
  Filled 2022-01-24: qty 100

## 2022-01-24 MED ORDER — LIDOCAINE 2% (20 MG/ML) 5 ML SYRINGE
INTRAMUSCULAR | Status: AC
  Filled 2022-01-24: qty 5

## 2022-01-24 MED ORDER — BUPIVACAINE-EPINEPHRINE 0.5% -1:200000 IJ SOLN
INTRAMUSCULAR | Status: AC
  Filled 2022-01-24: qty 1

## 2022-01-24 MED ORDER — FLEET ENEMA 7-19 GM/118ML RE ENEM: 1.0000 | ENEMA | Freq: Once | RECTAL | Status: DC

## 2022-01-24 MED ORDER — LIDOCAINE 2% (20 MG/ML) 5 ML SYRINGE
INTRAMUSCULAR | Status: DC | PRN
  Administered 2022-01-24: 100 mg via INTRAVENOUS

## 2022-01-24 MED ORDER — DIPHENHYDRAMINE HCL 12.5 MG/5ML PO ELIX: 12.5000 mg | ORAL_SOLUTION | Freq: Four times a day (QID) | ORAL | Status: DC | PRN

## 2022-01-24 MED ORDER — PHENYLEPHRINE 80 MCG/ML (10ML) SYRINGE FOR IV PUSH (FOR BLOOD PRESSURE SUPPORT)
PREFILLED_SYRINGE | INTRAVENOUS | Status: DC | PRN
  Administered 2022-01-24: 120 ug via INTRAVENOUS

## 2022-01-24 MED ORDER — FENTANYL CITRATE PF 50 MCG/ML IJ SOSY
PREFILLED_SYRINGE | INTRAMUSCULAR | Status: AC
  Filled 2022-01-24: qty 1

## 2022-01-24 MED ORDER — SULFAMETHOXAZOLE-TRIMETHOPRIM 800-160 MG PO TABS: 1.0000 | ORAL_TABLET | Freq: Two times a day (BID) | ORAL | 0 refills | Status: DC

## 2022-01-24 MED ORDER — CEFAZOLIN SODIUM-DEXTROSE 1-4 GM/50ML-% IV SOLN
1.0000 g | Freq: Three times a day (TID) | INTRAVENOUS | Status: AC
  Administered 2022-01-24 – 2022-01-25 (×2): 1 g via INTRAVENOUS
  Filled 2022-01-24 (×2): qty 50

## 2022-01-24 MED ORDER — ROCURONIUM BROMIDE 10 MG/ML (PF) SYRINGE
PREFILLED_SYRINGE | INTRAVENOUS | Status: DC | PRN
  Administered 2022-01-24: 100 mg via INTRAVENOUS
  Administered 2022-01-24: 30 mg via INTRAVENOUS

## 2022-01-24 MED ORDER — LIDOCAINE HCL (PF) 2 % IJ SOLN: INTRAMUSCULAR | Status: DC | PRN

## 2022-01-24 MED ORDER — ACETAMINOPHEN 10 MG/ML IV SOLN: 1000.0000 mg | Freq: Once | INTRAVENOUS | Status: DC | PRN

## 2022-01-24 MED ORDER — POLYETHYLENE GLYCOL 3350 17 G PO PACK
17.0000 g | PACK | Freq: Every day | ORAL | Status: DC
Start: 2022-01-24 — End: 2022-01-24

## 2022-01-24 MED ORDER — PROPOFOL 10 MG/ML IV BOLUS
INTRAVENOUS | Status: DC | PRN
  Administered 2022-01-24: 140 mg via INTRAVENOUS

## 2022-01-24 MED ORDER — SODIUM CHLORIDE 0.9 % IV SOLN
INTRAVENOUS | Status: DC | PRN
  Administered 2022-01-24: 250 mL via INTRAVENOUS

## 2022-01-24 MED ORDER — ONDANSETRON HCL 4 MG/2ML IJ SOLN
INTRAMUSCULAR | Status: DC | PRN
  Administered 2022-01-24: 4 mg via INTRAVENOUS

## 2022-01-24 MED ORDER — ROCURONIUM BROMIDE 10 MG/ML (PF) SYRINGE
PREFILLED_SYRINGE | INTRAVENOUS | Status: AC
  Filled 2022-01-24: qty 10

## 2022-01-24 MED ORDER — TRIPLE ANTIBIOTIC 3.5-400-5000 EX OINT: 1.0000 | TOPICAL_OINTMENT | Freq: Three times a day (TID) | CUTANEOUS | Status: DC | PRN

## 2022-01-24 MED ORDER — FENTANYL CITRATE (PF) 100 MCG/2ML IJ SOLN
INTRAMUSCULAR | Status: DC | PRN
Start: 2022-01-24 — End: 2022-01-24
  Administered 2022-01-24 (×2): 100 ug via INTRAVENOUS
  Administered 2022-01-24 (×2): 50 ug via INTRAVENOUS

## 2022-01-24 MED ORDER — SODIUM CHLORIDE 0.9 % IR SOLN
Status: DC | PRN
  Administered 2022-01-24: 1000 mL via INTRAVESICAL

## 2022-01-24 MED ORDER — CHLORTHALIDONE 25 MG PO TABS
12.5000 mg | ORAL_TABLET | Freq: Every day | ORAL | Status: DC
  Administered 2022-01-24 – 2022-01-25 (×2): 12.5 mg via ORAL
  Filled 2022-01-24 (×2): qty 0.5

## 2022-01-24 MED ORDER — FENTANYL CITRATE (PF) 100 MCG/2ML IJ SOLN
INTRAMUSCULAR | Status: AC
  Filled 2022-01-24: qty 2

## 2022-01-24 MED ORDER — SODIUM CHLORIDE 0.9 % IV BOLUS
1000.0000 mL | Freq: Once | INTRAVENOUS | Status: AC
  Administered 2022-01-24: 1000 mL via INTRAVENOUS

## 2022-01-24 MED ORDER — LIDOCAINE HCL 2 % IJ SOLN
INTRAMUSCULAR | Status: AC
  Filled 2022-01-24: qty 20

## 2022-01-24 MED ORDER — TRAMADOL HCL 50 MG PO TABS: 50.0000 mg | ORAL_TABLET | Freq: Four times a day (QID) | ORAL | 0 refills | Status: DC | PRN

## 2022-01-24 MED ORDER — PRAVASTATIN SODIUM 40 MG PO TABS
40.0000 mg | ORAL_TABLET | Freq: Every day | ORAL | Status: DC
  Administered 2022-01-24: 40 mg via ORAL
  Filled 2022-01-24: qty 1

## 2022-01-24 MED ORDER — ONDANSETRON HCL 4 MG/2ML IJ SOLN
4.0000 mg | INTRAMUSCULAR | Status: DC | PRN
Start: 2022-01-24 — End: 2022-01-25

## 2022-01-24 MED ORDER — BUPIVACAINE-EPINEPHRINE 0.5% -1:200000 IJ SOLN
INTRAMUSCULAR | Status: DC | PRN
  Administered 2022-01-24: 43 mL

## 2022-01-24 MED ORDER — MIDAZOLAM HCL 5 MG/5ML IJ SOLN
INTRAMUSCULAR | Status: DC | PRN
  Administered 2022-01-24: 2 mg via INTRAVENOUS

## 2022-01-24 MED ORDER — SUGAMMADEX SODIUM 200 MG/2ML IV SOLN
INTRAVENOUS | Status: DC | PRN
  Administered 2022-01-24: 100 mg via INTRAVENOUS
  Administered 2022-01-24: 200 mg via INTRAVENOUS

## 2022-01-24 MED ORDER — ZOLPIDEM TARTRATE 5 MG PO TABS: 5.0000 mg | ORAL_TABLET | Freq: Every evening | ORAL | Status: DC | PRN

## 2022-01-24 MED ORDER — PHENYLEPHRINE 80 MCG/ML (10ML) SYRINGE FOR IV PUSH (FOR BLOOD PRESSURE SUPPORT)
PREFILLED_SYRINGE | INTRAVENOUS | Status: AC
  Filled 2022-01-24: qty 10

## 2022-01-24 SURGICAL SUPPLY — 68 items
ADH SKN CLS APL DERMABOND .7 (GAUZE/BANDAGES/DRESSINGS) ×1
APL PRP STRL LF DISP 70% ISPRP (MISCELLANEOUS) ×1
APL SWBSTK 6 STRL LF DISP (MISCELLANEOUS) ×1
APPLICATOR COTTON TIP 6 STRL (MISCELLANEOUS) ×2 IMPLANT
APPLICATOR COTTON TIP 6IN STRL (MISCELLANEOUS) ×1
BAG COUNTER SPONGE SURGICOUNT (BAG) IMPLANT
BAG SPNG CNTER NS LX DISP (BAG)
CATH FOLEY 2WAY SLVR 18FR 30CC (CATHETERS) ×2 IMPLANT
CATH ROBINSON RED A/P 16FR (CATHETERS) ×2 IMPLANT
CATH ROBINSON RED A/P 8FR (CATHETERS) ×2 IMPLANT
CATH TIEMANN FOLEY 18FR 5CC (CATHETERS) ×2 IMPLANT
CHLORAPREP W/TINT 26 (MISCELLANEOUS) ×2 IMPLANT
CLIP LIGATING HEM O LOK PURPLE (MISCELLANEOUS) ×4 IMPLANT
COVER SURGICAL LIGHT HANDLE (MISCELLANEOUS) ×2 IMPLANT
COVER TIP SHEARS 8 DVNC (MISCELLANEOUS) ×2 IMPLANT
COVER TIP SHEARS 8MM DA VINCI (MISCELLANEOUS) ×1
CUTTER ECHEON FLEX ENDO 45 340 (ENDOMECHANICALS) ×2 IMPLANT
DERMABOND ADVANCED (GAUZE/BANDAGES/DRESSINGS) ×1
DERMABOND ADVANCED .7 DNX12 (GAUZE/BANDAGES/DRESSINGS) ×2 IMPLANT
DRAIN CHANNEL RND F F (WOUND CARE) IMPLANT
DRAPE ARM DVNC X/XI (DISPOSABLE) ×8 IMPLANT
DRAPE COLUMN DVNC XI (DISPOSABLE) ×2 IMPLANT
DRAPE DA VINCI XI ARM (DISPOSABLE) ×4
DRAPE DA VINCI XI COLUMN (DISPOSABLE) ×1
DRAPE SURG IRRIG POUCH 19X23 (DRAPES) ×2 IMPLANT
DRSG TEGADERM 4X4.75 (GAUZE/BANDAGES/DRESSINGS) ×2 IMPLANT
ELECT PENCIL ROCKER SW 15FT (MISCELLANEOUS) ×2 IMPLANT
ELECT REM PT RETURN 15FT ADLT (MISCELLANEOUS) ×2 IMPLANT
GAUZE 4X4 16PLY ~~LOC~~+RFID DBL (SPONGE) ×2 IMPLANT
GAUZE SPONGE 4X4 12PLY STRL (GAUZE/BANDAGES/DRESSINGS) ×2 IMPLANT
GLOVE BIO SURGEON STRL SZ 6.5 (GLOVE) ×2 IMPLANT
GLOVE SURG LX 7.5 STRW (GLOVE) ×2
GLOVE SURG LX STRL 7.5 STRW (GLOVE) ×4 IMPLANT
GOWN SRG XL LVL 4 BRTHBL STRL (GOWNS) ×2 IMPLANT
GOWN STRL NON-REIN XL LVL4 (GOWNS) ×1
GOWN STRL REUS W/ TWL XL LVL3 (GOWN DISPOSABLE) ×4 IMPLANT
GOWN STRL REUS W/TWL XL LVL3 (GOWN DISPOSABLE) ×2
HOLDER FOLEY CATH W/STRAP (MISCELLANEOUS) ×2 IMPLANT
IRRIG SUCT STRYKERFLOW 2 WTIP (MISCELLANEOUS) ×1
IRRIGATION SUCT STRKRFLW 2 WTP (MISCELLANEOUS) ×2 IMPLANT
IV LACTATED RINGERS 1000ML (IV SOLUTION) ×2 IMPLANT
KIT TURNOVER KIT A (KITS) IMPLANT
NDL SAFETY ECLIPSE 18X1.5 (NEEDLE) ×2 IMPLANT
NEEDLE HYPO 18GX1.5 SHARP (NEEDLE) ×1
PACK ROBOT UROLOGY CUSTOM (CUSTOM PROCEDURE TRAY) ×2 IMPLANT
RELOAD STAPLE 45 4.1 GRN THCK (STAPLE) ×2 IMPLANT
SEAL CANN UNIV 5-8 DVNC XI (MISCELLANEOUS) ×8 IMPLANT
SEAL XI 5MM-8MM UNIVERSAL (MISCELLANEOUS) ×4
SET TUBE SMOKE EVAC HIGH FLOW (TUBING) ×2 IMPLANT
SOLUTION ELECTROLUBE (MISCELLANEOUS) ×2 IMPLANT
SPIKE FLUID TRANSFER (MISCELLANEOUS) ×2 IMPLANT
STAPLE RELOAD 45 GRN (STAPLE) ×1 IMPLANT
STAPLE RELOAD 45MM GREEN (STAPLE) ×1
SUT ETHILON 3 0 PS 1 (SUTURE) ×2 IMPLANT
SUT MNCRL 3 0 RB1 (SUTURE) ×2 IMPLANT
SUT MNCRL 3 0 VIOLET RB1 (SUTURE) ×2 IMPLANT
SUT MNCRL AB 4-0 PS2 18 (SUTURE) ×4 IMPLANT
SUT MONOCRYL 3 0 RB1 (SUTURE) ×2
SUT PDS PLUS 0 (SUTURE) ×2
SUT PDS PLUS AB 0 CT-2 (SUTURE) ×4 IMPLANT
SUT VIC AB 0 CT1 27 (SUTURE) ×2
SUT VIC AB 0 CT1 27XBRD ANTBC (SUTURE) ×4 IMPLANT
SUT VIC AB 2-0 SH 27 (SUTURE) ×1
SUT VIC AB 2-0 SH 27X BRD (SUTURE) ×2 IMPLANT
SYR 27GX1/2 1ML LL SAFETY (SYRINGE) ×2 IMPLANT
TOWEL OR NON WOVEN STRL DISP B (DISPOSABLE) ×2 IMPLANT
TROCAR Z-THREAD FIOS 5X100MM (TROCAR) IMPLANT
WATER STERILE IRR 1000ML POUR (IV SOLUTION) ×2 IMPLANT

## 2022-01-24 NOTE — Plan of Care (Signed)
  Problem: Education: Goal: Knowledge of General Education information will improve Description: Including pain rating scale, medication(s)/side effects and non-pharmacologic comfort measures Outcome: Progressing   Problem: Health Behavior/Discharge Planning: Goal: Ability to manage health-related needs will improve Outcome: Progressing   Problem: Coping: Goal: Level of anxiety will decrease Outcome: Progressing   Problem: Elimination: Goal: Will not experience complications related to bowel motility Outcome: Progressing Goal: Will not experience complications related to urinary retention Outcome: Progressing   Problem: Activity: Goal: Risk for activity intolerance will decrease Outcome: Progressing

## 2022-01-24 NOTE — Transfer of Care (Signed)
Immediate Anesthesia Transfer of Care Note  Patient: Steve Weaver  Procedure(s) Performed: XI ROBOTIC ASSISTED LAPAROSCOPIC RADICAL PROSTATECTOMY LEVEL 2 (Abdomen)  Patient Location: PACU  Anesthesia Type:General  Level of Consciousness: awake and patient cooperative  Airway & Oxygen Therapy: Patient Spontanous Breathing and Patient connected to face mask  Post-op Assessment: Report given to RN and Post -op Vital signs reviewed and stable  Post vital signs: Reviewed and stable  Last Vitals:  Vitals Value Taken Time  BP 170/80 01/24/22 1409  Temp    Pulse 60 01/24/22 1411  Resp 20 01/24/22 1411  SpO2 98 % 01/24/22 1411  Vitals shown include unvalidated device data.  Last Pain:  Vitals:   01/24/22 1014  TempSrc:   PainSc: 0-No pain         Complications: No notable events documented.

## 2022-01-24 NOTE — Op Note (Signed)
Preoperative diagnosis: Clinically localized adenocarcinoma of the prostate (clinical stage T1c N0 M0)  Postoperative diagnosis: Clinically localized adenocarcinoma of the prostate (clinical stage T1c N0 M0)  Procedure:  Robotic assisted laparoscopic radical prostatectomy (left nerve sparing) Bilateral robotic assisted laparoscopic pelvic lymphadenectomy  Surgeon: Pryor Curia. M.D.  Assistant(s): Debbrah Alar, PA-C  An assistant was required for this surgical procedure.  The duties of the assistant included but were not limited to suctioning, passing suture, camera manipulation, retraction. This procedure would not be able to be performed without an Environmental consultant.   Anesthesia: General  Complications: None  EBL: 50 mL  IVF:  1200 mL crystalloid  Specimens: Prostate and seminal vesicles Right pelvic lymph nodes Left pelvic lymph nodes  Disposition of specimens: Pathology  Drains: 20 Fr coude catheter # 19 Blake pelvic drain  Indication: Steve Weaver is a 71 y.o. patient with clinically localized prostate cancer.  After a thorough review of the management options for treatment of prostate cancer, he elected to proceed with surgical therapy and the above procedure(s).  We have discussed the potential benefits and risks of the procedure, side effects of the proposed treatment, the likelihood of the patient achieving the goals of the procedure, and any potential problems that might occur during the procedure or recuperation. Informed consent has been obtained.  Description of procedure:  The patient was taken to the operating room and a general anesthetic was administered. He was given preoperative antibiotics, placed in the dorsal lithotomy position, and prepped and draped in the usual sterile fashion. Next a preoperative timeout was performed. A urethral catheter was placed into the bladder and a site was selected near the umbilicus for placement of the camera port. This  was placed using a standard open Hassan technique which allowed entry into the peritoneal cavity under direct vision and without difficulty. An 8 mm port was placed and a pneumoperitoneum established. The camera was then used to inspect the abdomen and there was no evidence of any intra-abdominal injuries or other abnormalities. The remaining abdominal ports were then placed. 8 mm robotic ports were placed in the right lower quadrant, left lower quadrant, and far left lateral abdominal wall. A 5 mm port was placed in the right upper quadrant and a 12 mm port was placed in the right lateral abdominal wall for laparoscopic assistance. All ports were placed under direct vision without difficulty. The surgical cart was then docked.   Utilizing the cautery scissors, the bladder was reflected posteriorly allowing entry into the space of Retzius and identification of the endopelvic fascia and prostate. The periprostatic fat was then removed from the prostate allowing full exposure of the endopelvic fascia. The endopelvic fascia was then incised from the apex back to the base of the prostate bilaterally and the underlying levator muscle fibers were swept laterally off the prostate thereby isolating the dorsal venous complex. The dorsal vein was then stapled and divided with a 45 mm Flex Echelon stapler. Attention then turned to the bladder neck which was divided anteriorly thereby allowing entry into the bladder and exposure of the urethral catheter. The catheter balloon was deflated and the catheter was brought into the operative field and used to retract the prostate anteriorly. The posterior bladder neck was then examined and was divided allowing further dissection between the bladder and prostate posteriorly until the vasa deferentia and seminal vessels were identified. The vasa deferentia were isolated, divided, and lifted anteriorly. The seminal vesicles were dissected down to their  tips with care to control the  seminal vascular arterial blood supply. These structures were then lifted anteriorly and the space between Denonvillier's fascia and the anterior rectum was developed with a combination of sharp and blunt dissection. This isolated the vascular pedicles of the prostate.  The lateral prostatic fascia on the left side of the prostate was then sharply incised allowing release of the neurovascular bundle. The vascular pedicle of the prostate on the left side was then ligated with Weck clips between the prostate and neurovascular bundle and divided with sharp cold scissor dissection resulting in neurovascular bundle preservation. On the right side, a wide non nerve sparing dissection was performed with Weck clips used to ligate the vascular pedicle of the prostate. The neurovascular bundle on the left side was then separated off the apex of the prostate and urethra.   The urethra was then sharply transected allowing the prostate specimen to be disarticulated. The pelvis was copiously irrigated and hemostasis was ensured. There was no evidence for rectal injury.  Attention then turned to the right pelvic sidewall. The fibrofatty tissue between the external iliac vein, confluence of the iliac vessels, hypogastric artery, and Cooper's ligament was dissected free from the pelvic sidewall with care to preserve the obturator nerve. Weck clips were used for lymphostasis and hemostasis. An identical procedure was performed on the contralateral side and the lymphatic packets were removed for permanent pathologic analysis.  Attention then turned to the urethral anastomosis. A 2-0 Vicryl slip knot was placed between Denonvillier's fascia, the posterior bladder neck, and the posterior urethra to reapproximate these structures. A double-armed 3-0 Monocryl suture was then used to perform a 360 running tension-free anastomosis between the bladder neck and urethra. A new urethral catheter was then placed into the bladder and  irrigated. There were no blood clots within the bladder and the anastomosis appeared to be watertight. A #19 Blake drain was then brought through the left lateral 8 mm port site and positioned appropriately within the pelvis. It was secured to the skin with a nylon suture. The surgical cart was then undocked. The right lateral 12 mm port site was closed at the fascial level with a 0 Vicryl suture placed laparoscopically. All remaining ports were then removed under direct vision. The prostate specimen was removed intact within the Endopouch retrieval bag via the periumbilical camera port site. This fascial opening was closed with two running 0 PDS sutures. 0.25% Marcaine was then injected into all port sites and all incisions were reapproximated at the skin level with 4-0 Monocryl subcuticular sutures and Dermabond. The patient appeared to tolerate the procedure well and without complications. The patient was able to be extubated and transferred to the recovery unit in satisfactory condition.   Pryor Curia MD

## 2022-01-24 NOTE — Interval H&P Note (Signed)
History and Physical Interval Note:  01/24/2022 11:05 AM  Steve Weaver  has presented today for surgery, with the diagnosis of PROSTATE CANCER.  The various methods of treatment have been discussed with the patient and family. After consideration of risks, benefits and other options for treatment, the patient has consented to  Procedure(s): XI ROBOTIC [place] 2 (N/A) as a surgical intervention.  The patient's history has been reviewed, patient examined, no change in status, stable for surgery.  I have reviewed the patient's chart and labs.  Questions were answered to the patient's satisfaction.     Les [employer]

## 2022-01-24 NOTE — Discharge Instructions (Addendum)
Activity:  You are encouraged to ambulate frequently (about every hour during waking hours) to help prevent blood clots from forming in your legs or lungs.  However, you should not engage in any heavy lifting (> 10-15 lbs), strenuous activity, or straining. Diet: You should continue a clear liquid diet until passing gas from below.  Once this occurs, you may advance your diet to a soft diet that would be easy to digest (i.e soups, scrambled eggs, mashed potatoes, etc.) for 24 hours just as you would if getting over a bad stomach flu.  If tolerating this diet well for 24 hours, you may then begin eating regular food.  It will be normal to have some amount of bloating, nausea, and abdominal discomfort intermittently. Prescriptions:  You will be provided a prescription for pain medication to take as needed.  If your pain is not severe enough to require the prescription pain medication, you may take Tylenol instead.  You should also take an over the counter stool softener (Colace 100 mg twice daily) to avoid straining with bowel movements as the pain medication may constipate you. Finally, you will also be provided a prescription for an antibiotic to begin the day prior to your return visit in the office for catheter removal. Catheter care: You will be taught how to take care of the catheter by the nursing staff prior to discharge from the hospital.  You may use both a leg bag and the larger bedside bag but it is recommended to at least use the bigger bedside bag at nighttime as the leg bag is small and will fill up overnight and also does not drain as well when lying flat. You may periodically feel a strong urge to void with the catheter in place.  This is a bladder spasm and most often can occur when having a bowel movement or when you are moving around. It is typically self-limited and usually will stop after a few minutes.  You may use some Vaseline or Neosporin around the tip of the catheter to reduce friction  at the tip of the penis. Incisions: You may remove your dressing bandages the 2nd day after surgery.  You most likely will have a few small staples in each of the incisions and once the bandages are removed, the incisions may stay open to air.  You may start showering (not soaking or bathing in water) 48 hours after surgery and the incisions simply need to be patted dry after the shower.  No additional care is needed. What to call us about: You should call the office ([phone]) if you develop fever > 101, persistent vomiting, or the catheter stops draining. Also, feel free to call with any other questions you may have and remember the handout that was provided to you as a reference preoperatively which answers many of the common questions that arise after surgery. You may resume aspirin, advil, aleve, vitamins, and supplements 7 days after surgery.   You may resume Eliquis 3 days after surgery. 

## 2022-01-24 NOTE — Plan of Care (Signed)
  Problem: Activity: Goal: Risk for activity intolerance will decrease Outcome: Progressing   Problem: Education: Goal: Knowledge of the procedure and recovery process will improve Outcome: Progressing   Problem: Education: Goal: Knowledge of General Education information will improve Description: Including pain rating scale, medication(s)/side effects and non-pharmacologic comfort measures Outcome: Adequate for Discharge

## 2022-01-24 NOTE — Anesthesia Procedure Notes (Signed)
Procedure Name: Intubation Date/Time: 01/24/2022 11:32 AM  Performed by: Claudia Desanctis, CRNAPre-anesthesia Checklist: Patient identified, Emergency Drugs available, Suction available and Patient being monitored Patient Re-evaluated:Patient Re-evaluated prior to induction Oxygen Delivery Method: Circle system utilized Preoxygenation: Pre-oxygenation with 100% oxygen Induction Type: IV induction Ventilation: Mask ventilation without difficulty Laryngoscope Size: Miller and 3 Grade View: Grade I Tube type: Oral Tube size: 7.5 mm Number of attempts: 1 Airway Equipment and Method: Stylet Placement Confirmation: ETT inserted through vocal cords under direct vision, positive ETCO2 and breath sounds checked- equal and bilateral Secured at: 21 cm Tube secured with: Tape Dental Injury: Teeth and Oropharynx as per pre-operative assessment

## 2022-01-24 NOTE — Anesthesia Postprocedure Evaluation (Signed)
Anesthesia Post Note  Patient: Steve Weaver  Procedure(s) Performed: XI ROBOTIC ASSISTED LAPAROSCOPIC RADICAL PROSTATECTOMY LEVEL 2 (Abdomen)     Patient location during evaluation: PACU Anesthesia Type: General Level of consciousness: awake and alert Pain management: pain level controlled Vital Signs Assessment: post-procedure vital signs reviewed and stable Respiratory status: spontaneous breathing, nonlabored ventilation, respiratory function stable and patient connected to nasal cannula oxygen Cardiovascular status: blood pressure returned to baseline and stable Postop Assessment: no apparent nausea or vomiting Anesthetic complications: no   No notable events documented.  Last Vitals:  Vitals:   01/24/22 1545 01/24/22 1627  BP: (!) 165/76 (!) 172/80  Pulse: 60 66  Resp: 11   Temp:    SpO2: 95% 98%    Last Pain:  Vitals:   01/24/22 1703  TempSrc:   PainSc: 3                  Barnet Glasgow

## 2022-01-24 NOTE — Progress Notes (Signed)
Attempted to call report, waiting on return call.

## 2022-01-24 NOTE — Progress Notes (Signed)
Patient ID: Steve Weaver, male   DOB: [birth year], 71 y.o.   MRN: [id number]  Post-op note  Subjective: The patient is doing well.  No complaints.  Objective: Vital signs in last 24 hours: Temp:  [97.6 F (36.4 C)] 97.6 F (36.4 C) (08/17 1409) Pulse Rate:  [54-80] 58 (08/17 1430) Resp:  [15-19] 16 (08/17 1430) BP: (147-170)/(69-99) 158/69 (08/17 1430) SpO2:  [98 %-100 %] 98 % (08/17 1430) Weight:  [87.5 kg] 87.5 kg (08/17 1014)  Intake/Output from previous day: No intake/output data recorded. Intake/Output this shift: Total I/O In: 1500 [I.V.:1300; IV Piggyback:200] Out: 50 [Blood:50]  Physical Exam:  General: Alert and oriented. Abdomen: Soft, Nondistended. Incisions: Clean and dry. GU: Urine pink and draining well.   Lab Results:  Assessment/Plan: POD#0   1) Continue to monitor, ambulate, IS   Pryor Curia. MD   LOS: 0 days   Dutch Gray 01/24/2022, 2:42 PM

## 2022-01-24 NOTE — Interval H&P Note (Signed)
History and Physical Interval Note:  01/24/2022 9:42 AM  Steve Weaver  has presented today for surgery, with the diagnosis of PROSTATE CANCER.  The various methods of treatment have been discussed with the patient and family. After consideration of risks, benefits and other options for treatment, the patient has consented to  Procedure(s): XI ROBOTIC [place] 2 (N/A) as a surgical intervention.  The patient's history has been reviewed, patient examined, no change in status, stable for surgery.  I have reviewed the patient's chart and labs.  Questions were answered to the patient's satisfaction.     Les [employer]

## 2022-01-25 ENCOUNTER — Encounter (HOSPITAL_COMMUNITY): Payer: Self-pay | Admitting: Urology

## 2022-01-25 DIAGNOSIS — C61 Malignant neoplasm of prostate: Secondary | ICD-10-CM | POA: Diagnosis not present

## 2022-01-25 LAB — HEMOGLOBIN AND HEMATOCRIT, BLOOD
HCT: 45.6 % (ref 39.0–52.0)
Hemoglobin: 16.3 g/dL (ref 13.0–17.0)

## 2022-01-25 MED ORDER — BISACODYL 10 MG RE SUPP
10.0000 mg | Freq: Once | RECTAL | Status: AC
  Administered 2022-01-25: 10 mg via RECTAL
  Filled 2022-01-25: qty 1

## 2022-01-25 MED ORDER — TRAMADOL HCL 50 MG PO TABS: 50.0000 mg | ORAL_TABLET | Freq: Four times a day (QID) | ORAL | Status: DC | PRN

## 2022-01-25 NOTE — Discharge Summary (Signed)
  Date of admission: 01/24/2022  Date of discharge: 01/25/2022  Admission diagnosis: Prostate Cancer  Discharge diagnosis: Prostate Cancer  History and Physical: For full details, please see admission history and physical. Briefly, Steve Weaver is a 71 y.o. gentleman with localized prostate cancer.  After discussing management/treatment options, he elected to proceed with surgical treatment.  Hospital Course: Steve Weaver was taken to the operating room on 01/24/2022 and underwent a robotic assisted laparoscopic radical prostatectomy. He tolerated this procedure well and without complications. Postoperatively, he was able to be transferred to a regular hospital room following recovery from anesthesia.  He was able to begin ambulating the night of surgery. He remained hemodynamically stable overnight.  He had excellent urine output with appropriately minimal output from his pelvic drain and his pelvic drain was removed on POD #1.  He was transitioned to oral pain medication, tolerated a clear liquid diet, and had met all discharge criteria and was able to be discharged home later on POD#1.  Laboratory values:  Recent Labs    01/24/22 1503 01/25/22 0523  HGB 17.1* 16.3  HCT 48.4 45.6    Disposition: Home  Discharge instruction: He was instructed to be ambulatory but to refrain from heavy lifting, strenuous activity, or driving. He was instructed on urethral catheter care.  Discharge medications:   Allergies as of 01/25/2022       Reactions   Other Rash   Muscle Relaxer (unsure of name)        Medication List     STOP taking these medications    apixaban 5 MG Tabs tablet Commonly known as: Eliquis   CO Q 10 PO   Magnesium 300 MG Caps   multivitamin capsule   Omega-3 Fish Oil 1200 MG Caps   Turmeric 500 MG Caps   Vitamin D 50 MCG (2000 UT) tablet   zinc gluconate 50 MG tablet       TAKE these medications    chlorthalidone 25 MG tablet Commonly known  as: HYGROTON TAKE 1/2 TABLET BY MOUTH EVERY DAY   diltiazem 120 MG 24 hr capsule Commonly known as: CARDIZEM CD TAKE 1 CAPSULE BY MOUTH EVERY DAY   docusate sodium 100 MG capsule Commonly known as: COLACE Take 1 capsule (100 mg total) by mouth 2 (two) times daily.   lovastatin 40 MG tablet Commonly known as: MEVACOR TAKE 1 TABLET BY MOUTH EVERY DAY   metoprolol tartrate 25 MG tablet Commonly known as: LOPRESSOR Take 1 tablet (25 mg total) by mouth 2 (two) times daily. What changed: how much to take   sulfamethoxazole-trimethoprim 800-160 MG tablet Commonly known as: BACTRIM DS Take 1 tablet by mouth 2 (two) times daily. Start the day prior to foley removal appointment   traMADol 50 MG tablet Commonly known as: Ultram Take 1-2 tablets (50-100 mg total) by mouth every 6 (six) hours as needed for moderate pain or severe pain.        Followup: He will followup in 1 week for catheter removal and to discuss his surgical pathology results.

## 2022-01-25 NOTE — TOC Transition Note (Signed)
Transition of Care [facility]) - CM/SW Discharge Note   Patient Details  Name: Steve Weaver MRN: [id number] Date of Birth: 11/24/1950  Transition of Care [facility]) CM/SW Contact:  Leeroy Cha, RN Phone Number: 01/25/2022, 1:00 PM   Clinical Narrative:    Pt dcd to home with self care.   Final next level of care: Home/Self Care Barriers to Discharge: Barriers Resolved   Patient Goals and CMS Choice Patient states their goals for this hospitalization and ongoing recovery are:: to go home CMS Medicare.gov Compare Post Acute Care list provided to:: Patient    Discharge Placement                       Discharge Plan and Services   Discharge Planning Services: CM Consult                                 Social Determinants of Health (SDOH) Interventions     Readmission Risk Interventions     No data to display

## 2022-01-25 NOTE — Progress Notes (Signed)
Patient ID: Steve Weaver, male   DOB: 10-23-1950, 71 y.o.   MRN: [id number]  1 Day Post-Op Subjective: The patient is doing well.  No nausea or vomiting. Pain is adequately controlled.  Objective: Vital signs in last 24 hours: Temp:  [97.6 F (36.4 C)-98.2 F (36.8 C)] 98.1 F (36.7 C) (08/18 0529) Pulse Rate:  [54-111] 85 (08/18 0529) Resp:  [11-20] 20 (08/18 0529) BP: (133-172)/(69-99) 133/71 (08/18 0529) SpO2:  [92 %-100 %] 96 % (08/18 0529) Weight:  [87.5 kg-88.9 kg] 88.9 kg (08/17 2330)  Intake/Output from previous day: 08/17 0701 - 08/18 0700 In: 4514 [P.O.:100; I.V.:3114; IV Piggyback:1300] Out: 1950 [QBVQX:4503; Drains:125; Blood:50] Intake/Output this shift: No intake/output data recorded.  Physical Exam:  General: Alert and oriented. CV: RRR Lungs: Clear bilaterally. GI: Soft, Nondistended. Incisions: Clean, dry, and intact Urine: Clear Extremities: Nontender, no erythema, no edema.  Lab Results: Recent Labs    01/24/22 1503 01/25/22 0523  HGB 17.1* 16.3  HCT 48.4 45.6      Assessment/Plan: POD# 1 s/p robotic prostatectomy.  1) SL IVF 2) Ambulate, Incentive spirometry 3) Transition to oral pain medication 4) Dulcolax suppository 5) D/C pelvic drain 6) Plan for likely discharge later today   Pryor Curia. MD   LOS: 0 days   Dutch Gray 01/25/2022, 7:31 AM

## 2022-01-25 NOTE — TOC Initial Note (Signed)
Transition of Care [facility]) - Initial/Assessment Note    Patient Details  Name: Steve Weaver MRN: [id number] Date of Birth: 1951/05/20  Transition of Care [facility]) CM/SW Contact:    Leeroy Cha, RN Phone Number: 01/25/2022, 8:38 AM  Clinical Narrative:                  Transition of Care [facility]) Screening Note   Patient Details  Name: Steve Weaver Date of Birth: [birth year]   Transition of Care [facility]) CM/SW Contact:    Leeroy Cha, RN Phone Number: 01/25/2022, 8:39 AM    Transition of Care Department [facility]) has reviewed patient and no TOC needs have been identified at this time. We will continue to monitor patient advancement through interdisciplinary progression rounds. If new patient transition needs arise, please place a TOC consult.    Expected Discharge Plan: Home/Self Care Barriers to Discharge: Continued Medical Work up   Patient Goals and CMS Choice Patient states their goals for this hospitalization and ongoing recovery are:: to go home CMS Medicare.gov Compare Post Acute Care list provided to:: Patient    Expected Discharge Plan and Services Expected Discharge Plan: Home/Self Care   Discharge Planning Services: CM Consult   Living arrangements for the past 2 months: Single Family Home                                      Prior Living Arrangements/Services Living arrangements for the past 2 months: Single Family Home Lives with:: Spouse Patient language and need for interpreter reviewed:: Yes Do you feel safe going back to the place where you live?: Yes            Criminal Activity/Legal Involvement Pertinent to Current Situation/Hospitalization: No - Comment as needed  Activities of Daily Living Home Assistive Devices/Equipment: Blood pressure cuff, Eyeglasses, Walker (specify type) ADL Screening (condition at time of admission) Patient's cognitive ability adequate to safely complete daily activities?: Yes Is the patient  deaf or have difficulty hearing?: No Does the patient have difficulty seeing, even when wearing glasses/contacts?: No Does the patient have difficulty concentrating, remembering, or making decisions?: No Patient able to express need for assistance with ADLs?: Yes Does the patient have difficulty dressing or bathing?: No Independently performs ADLs?: Yes (appropriate for developmental age) Does the patient have difficulty walking or climbing stairs?: No Weakness of Legs: None Weakness of Arms/Hands: None  Permission Sought/Granted                  Emotional Assessment Appearance:: Appears stated age Attitude/Demeanor/Rapport: Engaged Affect (typically observed): Calm Orientation: : Oriented to Self, Oriented to Place, Oriented to  Time, Oriented to Situation Alcohol / Substance Use: Not Applicable Psych Involvement: No (comment)  Admission diagnosis:  Prostate cancer [facility]) [C61] Patient Active Problem List   Diagnosis Date Noted   Prostate cancer ([place]) 01/24/2022   Osteoarthritis of left hip 06/14/2020   Shortness of breath 01/27/2018   History of aortic valve insufficiency 01/27/2018   Sleep apnea 12/25/2015   Nasal septal defect 12/25/2015   Tinnitus 12/25/2015   Hx of closed fracture of nasal bones 12/25/2015   Persistent atrial fibrillation ([place])    Atrial fibrillation ([place]) 05/29/2015   Fatigue 04/28/2013   Atrial flutter, paroxysmal ([place]) 04/28/2013   PAC (premature atrial contraction) 04/28/2013   Essential hypertension 04/28/2013   Dyslipidemia 04/28/2013   PCP:  Tisovec, Fransico Him, MD Pharmacy:   [id number]: [phone]: [phone]    Social Determinants of Health (SDOH) Interventions    Readmission Risk Interventions     No data to display

## 2022-01-31 LAB — SURGICAL PATHOLOGY

## 2022-02-08 DIAGNOSIS — N1831 Chronic kidney disease, stage 3a: Secondary | ICD-10-CM | POA: Diagnosis not present

## 2022-02-08 DIAGNOSIS — Z7901 Long term (current) use of anticoagulants: Secondary | ICD-10-CM | POA: Diagnosis not present

## 2022-02-08 DIAGNOSIS — I129 Hypertensive chronic kidney disease with stage 1 through stage 4 chronic kidney disease, or unspecified chronic kidney disease: Secondary | ICD-10-CM | POA: Diagnosis not present

## 2022-02-08 DIAGNOSIS — C61 Malignant neoplasm of prostate: Secondary | ICD-10-CM | POA: Diagnosis not present

## 2022-02-08 DIAGNOSIS — Z9079 Acquired absence of other genital organ(s): Secondary | ICD-10-CM | POA: Diagnosis not present

## 2022-02-08 DIAGNOSIS — I480 Paroxysmal atrial fibrillation: Secondary | ICD-10-CM | POA: Diagnosis not present

## 2022-02-16 ENCOUNTER — Other Ambulatory Visit: Payer: Self-pay | Admitting: Internal Medicine

## 2022-02-16 DIAGNOSIS — I4892 Unspecified atrial flutter: Secondary | ICD-10-CM

## 2022-02-20 DIAGNOSIS — M62838 Other muscle spasm: Secondary | ICD-10-CM | POA: Diagnosis not present

## 2022-02-20 DIAGNOSIS — M6281 Muscle weakness (generalized): Secondary | ICD-10-CM | POA: Diagnosis not present

## 2022-02-20 DIAGNOSIS — N393 Stress incontinence (female) (male): Secondary | ICD-10-CM | POA: Diagnosis not present

## 2022-03-06 DIAGNOSIS — M62838 Other muscle spasm: Secondary | ICD-10-CM | POA: Diagnosis not present

## 2022-03-06 DIAGNOSIS — N393 Stress incontinence (female) (male): Secondary | ICD-10-CM | POA: Diagnosis not present

## 2022-03-06 DIAGNOSIS — M6281 Muscle weakness (generalized): Secondary | ICD-10-CM | POA: Diagnosis not present

## 2022-03-11 ENCOUNTER — Other Ambulatory Visit: Payer: Self-pay | Admitting: Internal Medicine

## 2022-03-28 DIAGNOSIS — M6281 Muscle weakness (generalized): Secondary | ICD-10-CM | POA: Diagnosis not present

## 2022-03-28 DIAGNOSIS — N393 Stress incontinence (female) (male): Secondary | ICD-10-CM | POA: Diagnosis not present

## 2022-03-28 DIAGNOSIS — M62838 Other muscle spasm: Secondary | ICD-10-CM | POA: Diagnosis not present

## 2022-03-28 DIAGNOSIS — R8271 Bacteriuria: Secondary | ICD-10-CM | POA: Diagnosis not present

## 2022-04-17 DIAGNOSIS — N393 Stress incontinence (female) (male): Secondary | ICD-10-CM | POA: Diagnosis not present

## 2022-04-17 DIAGNOSIS — C61 Malignant neoplasm of prostate: Secondary | ICD-10-CM | POA: Diagnosis not present

## 2022-04-17 DIAGNOSIS — M6281 Muscle weakness (generalized): Secondary | ICD-10-CM | POA: Diagnosis not present

## 2022-04-17 DIAGNOSIS — M62838 Other muscle spasm: Secondary | ICD-10-CM | POA: Diagnosis not present

## 2022-04-24 DIAGNOSIS — N393 Stress incontinence (female) (male): Secondary | ICD-10-CM | POA: Diagnosis not present

## 2022-04-24 DIAGNOSIS — C61 Malignant neoplasm of prostate: Secondary | ICD-10-CM | POA: Diagnosis not present

## 2022-04-24 DIAGNOSIS — N5201 Erectile dysfunction due to arterial insufficiency: Secondary | ICD-10-CM | POA: Diagnosis not present

## 2022-04-24 DIAGNOSIS — N390 Urinary tract infection, site not specified: Secondary | ICD-10-CM | POA: Diagnosis not present

## 2022-04-26 ENCOUNTER — Other Ambulatory Visit: Payer: Self-pay | Admitting: Internal Medicine

## 2022-05-17 DIAGNOSIS — E7800 Pure hypercholesterolemia, unspecified: Secondary | ICD-10-CM | POA: Diagnosis not present

## 2022-05-17 DIAGNOSIS — R319 Hematuria, unspecified: Secondary | ICD-10-CM | POA: Diagnosis not present

## 2022-05-17 DIAGNOSIS — R972 Elevated prostate specific antigen [PSA]: Secondary | ICD-10-CM | POA: Diagnosis not present

## 2022-05-17 DIAGNOSIS — I129 Hypertensive chronic kidney disease with stage 1 through stage 4 chronic kidney disease, or unspecified chronic kidney disease: Secondary | ICD-10-CM | POA: Diagnosis not present

## 2022-05-20 DIAGNOSIS — M62838 Other muscle spasm: Secondary | ICD-10-CM | POA: Diagnosis not present

## 2022-05-20 DIAGNOSIS — M6281 Muscle weakness (generalized): Secondary | ICD-10-CM | POA: Diagnosis not present

## 2022-05-20 DIAGNOSIS — R310 Gross hematuria: Secondary | ICD-10-CM | POA: Diagnosis not present

## 2022-05-20 DIAGNOSIS — N393 Stress incontinence (female) (male): Secondary | ICD-10-CM | POA: Diagnosis not present

## 2022-05-24 DIAGNOSIS — D692 Other nonthrombocytopenic purpura: Secondary | ICD-10-CM | POA: Diagnosis not present

## 2022-05-24 DIAGNOSIS — C61 Malignant neoplasm of prostate: Secondary | ICD-10-CM | POA: Diagnosis not present

## 2022-05-24 DIAGNOSIS — E663 Overweight: Secondary | ICD-10-CM | POA: Diagnosis not present

## 2022-05-24 DIAGNOSIS — I129 Hypertensive chronic kidney disease with stage 1 through stage 4 chronic kidney disease, or unspecified chronic kidney disease: Secondary | ICD-10-CM | POA: Diagnosis not present

## 2022-05-24 DIAGNOSIS — D6869 Other thrombophilia: Secondary | ICD-10-CM | POA: Diagnosis not present

## 2022-05-24 DIAGNOSIS — N1831 Chronic kidney disease, stage 3a: Secondary | ICD-10-CM | POA: Diagnosis not present

## 2022-05-24 DIAGNOSIS — I480 Paroxysmal atrial fibrillation: Secondary | ICD-10-CM | POA: Diagnosis not present

## 2022-05-24 DIAGNOSIS — Z23 Encounter for immunization: Secondary | ICD-10-CM | POA: Diagnosis not present

## 2022-05-24 DIAGNOSIS — M5136 Other intervertebral disc degeneration, lumbar region: Secondary | ICD-10-CM | POA: Diagnosis not present

## 2022-05-24 DIAGNOSIS — Z7901 Long term (current) use of anticoagulants: Secondary | ICD-10-CM | POA: Diagnosis not present

## 2022-05-24 DIAGNOSIS — Z1331 Encounter for screening for depression: Secondary | ICD-10-CM | POA: Diagnosis not present

## 2022-05-24 DIAGNOSIS — G4733 Obstructive sleep apnea (adult) (pediatric): Secondary | ICD-10-CM | POA: Diagnosis not present

## 2022-05-24 DIAGNOSIS — C44311 Basal cell carcinoma of skin of nose: Secondary | ICD-10-CM | POA: Diagnosis not present

## 2022-05-24 DIAGNOSIS — Z0000 Encounter for general adult medical examination without abnormal findings: Secondary | ICD-10-CM | POA: Diagnosis not present

## 2022-05-24 DIAGNOSIS — Z1339 Encounter for screening examination for other mental health and behavioral disorders: Secondary | ICD-10-CM | POA: Diagnosis not present

## 2022-06-14 DIAGNOSIS — Z1212 Encounter for screening for malignant neoplasm of rectum: Secondary | ICD-10-CM | POA: Diagnosis not present

## 2022-06-14 DIAGNOSIS — R310 Gross hematuria: Secondary | ICD-10-CM | POA: Diagnosis not present

## 2022-06-14 DIAGNOSIS — N3000 Acute cystitis without hematuria: Secondary | ICD-10-CM | POA: Diagnosis not present

## 2022-07-05 DIAGNOSIS — R310 Gross hematuria: Secondary | ICD-10-CM | POA: Diagnosis not present

## 2022-07-25 DIAGNOSIS — R310 Gross hematuria: Secondary | ICD-10-CM | POA: Diagnosis not present

## 2022-07-25 DIAGNOSIS — R319 Hematuria, unspecified: Secondary | ICD-10-CM | POA: Diagnosis not present

## 2022-07-31 DIAGNOSIS — R310 Gross hematuria: Secondary | ICD-10-CM | POA: Diagnosis not present

## 2022-07-31 DIAGNOSIS — D414 Neoplasm of uncertain behavior of bladder: Secondary | ICD-10-CM | POA: Diagnosis not present

## 2022-08-02 ENCOUNTER — Telehealth: Payer: Self-pay | Admitting: Internal Medicine

## 2022-08-02 ENCOUNTER — Telehealth: Payer: Self-pay

## 2022-08-02 ENCOUNTER — Other Ambulatory Visit: Payer: Self-pay | Admitting: Urology

## 2022-08-02 NOTE — Telephone Encounter (Signed)
I left a message for the patient to call our office to schedule a tele visit for pre-op.

## 2022-08-02 NOTE — Telephone Encounter (Signed)
  Patient Consent for Virtual Visit        Steve Weaver has provided verbal consent on 08/02/2022 for a virtual visit (video or telephone).   CONSENT FOR VIRTUAL VISIT FOR:  Steve Weaver  By participating in this virtual visit I agree to the following:  I hereby voluntarily request, consent and authorize [facility] and its employed or contracted physicians, physician assistants, nurse practitioners or other licensed health care professionals (the Practitioner), to provide me with telemedicine health care services (the "Services") as deemed necessary by the treating Practitioner. I acknowledge and consent to receive the Services by the Practitioner via telemedicine. I understand that the telemedicine visit will involve communicating with the Practitioner through live audiovisual communication technology and the disclosure of certain medical information by electronic transmission. I acknowledge that I have been given the opportunity to request an in-person assessment or other available alternative prior to the telemedicine visit and am voluntarily participating in the telemedicine visit.  I understand that I have the right to withhold or withdraw my consent to the use of telemedicine in the course of my care at any time, without affecting my right to future care or treatment, and that the Practitioner or I may terminate the telemedicine visit at any time. I understand that I have the right to inspect all information obtained and/or recorded in the course of the telemedicine visit and may receive copies of available information for a reasonable fee.  I understand that some of the potential risks of receiving the Services via telemedicine include:  Delay or interruption in medical evaluation due to technological equipment failure or disruption; Information transmitted may not be sufficient (e.g. poor resolution of images) to allow for appropriate medical decision making by the  Practitioner; and/or  In rare instances, security protocols could fail, causing a breach of personal health information.  Furthermore, I acknowledge that it is my responsibility to provide information about my medical history, conditions and care that is complete and accurate to the best of my ability. I acknowledge that Practitioner's advice, recommendations, and/or decision may be based on factors not within their control, such as incomplete or inaccurate data provided by me or distortions of diagnostic images or specimens that may result from electronic transmissions. I understand that the practice of medicine is not an exact science and that Practitioner makes no warranties or guarantees regarding treatment outcomes. I acknowledge that a copy of this consent can be made available to me via my patient portal ([facility]), or I can request a printed copy by calling the office of [place].    I understand that my insurance will be billed for this visit.   I have read or had this consent read to me. I understand the contents of this consent, which adequately explains the benefits and risks of the Services being provided via telemedicine.  I have been provided ample opportunity to ask questions regarding this consent and the Services and have had my questions answered to my satisfaction. I give my informed consent for the services to be provided through the use of telemedicine in my medical care

## 2022-08-02 NOTE — Telephone Encounter (Signed)
   Name: Steve Weaver  DOB: 1950-08-12  MRN: [id number]  Primary Cardiologist: Pixie Casino, MD   Preoperative team, please contact this patient and set up a phone call appointment for further preoperative risk assessment. Please obtain consent and complete medication review. Thank you for your help.  I confirm that guidance regarding antiplatelet and oral anticoagulation therapy has been completed and, if necessary, noted below.  Per office protocol, patient can hold Eliquis for 3 days prior to procedure.     Lenna Sciara, NP 08/02/2022, 1:50 PM [facility]

## 2022-08-02 NOTE — Telephone Encounter (Signed)
Spoke with patient who is agreeable to do a tele visit on 3/8 at 10 am. Consent done, med rec needs to be completed.

## 2022-08-02 NOTE — Telephone Encounter (Signed)
   Pre-operative Risk Assessment    Patient Name: Steve Weaver  DOB: 13-Jan-1951 MRN: [id number]      Request for Surgical Clearance    Procedure:   syncope and transurethral of bladder tumor    Date of Surgery:  Clearance 09/05/22                                 Surgeon:  Dr. Wyvonnia Dusky Group or Practice Name:  [facility] Phone number:  [phone] Fax number:  [phone]   Type of Clearance Requested:   - Medical  - Pharmacy:  Hold Apixaban (Eliquis) 3 days prior    Type of Anesthesia:  General    Additional requests/questions:      Eston Mould   08/02/2022, 11:56 AM

## 2022-08-02 NOTE — Telephone Encounter (Signed)
Patient was returning call. Please advise  

## 2022-08-02 NOTE — Telephone Encounter (Signed)
Patient with diagnosis of afib on Eliquis for anticoagulation.    Procedure: syncope and transurethral of bladder tumor  Date of procedure: 09/05/22   CHA2DS2-VASc Score = 2   This indicates a 2.2% annual risk of stroke. The patient's score is based upon: CHF History: 0 HTN History: 1 Diabetes History: 0 Stroke History: 0 Vascular Disease History: 0 Age Score: 1 Gender Score: 0      CrCl 58 ml/min  Per office protocol, patient can hold Eliquis for 3 days prior to procedure.    **This guidance is not considered finalized until pre-operative APP has relayed final recommendations.**

## 2022-08-07 ENCOUNTER — Other Ambulatory Visit: Payer: Self-pay | Admitting: Internal Medicine

## 2022-08-14 NOTE — Progress Notes (Signed)
Virtual Visit via Telephone Note   Because of Steve Weaver Jr's co-morbid illnesses, he is at least at moderate risk for complications without adequate follow up.  This format is felt to be most appropriate for this patient at this time.  The patient did not have access to video technology/had technical difficulties with video requiring transitioning to audio format only (telephone).  All issues noted in this document were discussed and addressed.  No physical exam could be performed with this format.  Please refer to the patient's chart for his consent to telehealth for [facility]  Evaluation Performed:  Preoperative cardiovascular risk assessment _____________   Date:  08/14/2022   Patient ID:  Steve Weaver [birth year], MRN [id number] Patient Location:  Home Provider location:   Office  Primary Care Provider:  Haywood Pao, MD Primary Cardiologist:  Pixie Casino, MD  Chief Complaint / Patient Profile   72 y.o. y/o male with a h/o permanent atrial fibrillation on chronic anticoagulation, aortic valve regurgitation, HTN, HLD, OSA, prostate cancer who is pending cystoscopy and transurethral resection of bladder tumor and presents today for telephonic preoperative cardiovascular risk assessment.  History of Present Illness    Steve Weaver is a 72 y.o. male who presents via audio/video conferencing for a telehealth visit today.  Pt was last seen in cardiology clinic on 12/27/21 by Diona Browner, NP.  At that time Achyut Cannady was doing well.  The patient is now pending procedure as outlined above. Since his last visit, he  denies chest pain, shortness of breath, lower extremity edema, fatigue, palpitations, melena, hematuria, hemoptysis, diaphoresis, weakness, presyncope, syncope, orthopnea, and PND. He walks 1 to 1 1/2 miles daily for exercise with no concerning cardiac symptoms.   Past Medical History    Past Medical History:  Diagnosis Date    Arthritis    Cancer [facility])    prostate   Dysrhythmia    afib   Hyperlipidemia    Hypertension    Longstanding persistent atrial fibrillation [facility])    Past Surgical History:  Procedure Laterality Date   BROKE LEG  11/1998   CARDIOVERSION N/A 06/08/2015   Procedure: CARDIOVERSION;  Surgeon: Pixie Casino, MD;  Location: [facility];  Service: Cardiovascular;  Laterality: N/A;   CARDIOVERSION N/A 09/11/2015   Procedure: CARDIOVERSION;  Surgeon: Pixie Casino, MD;  Location: [facility] ENDOSCOPY;  Service: Cardiovascular;  Laterality: N/A;   NASAL SEPTOPLASTY W/ TURBINOPLASTY Bilateral 12/16/2016   Procedure: NASAL SEPTOPLASTY WITH BILATERAL TURBINATE REDUCTION;  Surgeon: Leta Baptist, MD;  Location: [facility];  Service: ENT;  Laterality: Bilateral;   NM MYOCAR [place]  05/04/2013   bruce myoview - normal stress nuclear study; a-flutter & a-fib with RVR, EF 59%   ROBOT ASSISTED LAPAROSCOPIC RADICAL PROSTATECTOMY N/A 01/24/2022   Procedure: XI ROBOTIC ASSISTED LAPAROSCOPIC RADICAL PROSTATECTOMY LEVEL 2;  Surgeon: Raynelle Bring, MD;  Location: [facility];  Service: Urology;  Laterality: N/A;   TOTAL HIP ARTHROPLASTY Left 06/14/2020   Procedure: TOTAL HIP ARTHROPLASTY ANTERIOR APPROACH;  Surgeon: Rod Can, MD;  Location: [facility];  Service: Orthopedics;  Laterality: Left;    Allergies  Allergies  Allergen Reactions   Other Rash    Muscle Relaxer (unsure of name)    Home Medications    Prior to Admission medications   Medication Sig Start Date End Date Taking? Authorizing Provider  chlorthalidone (HYGROTON) 25 MG tablet TAKE 1/2 TABLET BY MOUTH  EVERY DAY 03/11/22   Hilty, Nadean Corwin, MD  diltiazem (CARDIZEM CD) 120 MG 24 hr capsule TAKE 1 CAPSULE BY MOUTH EVERY DAY. Keep scheduled appointment for further refills 08/07/22   Pixie Casino, MD  docusate sodium (COLACE) 100 MG capsule Take 1 capsule (100 mg total) by mouth 2 (two) times daily. 01/24/22   Dancy, Estill Bamberg, PA-C   ELIQUIS 5 MG TABS tablet Take 5 mg by mouth 2 (two) times daily. 06/19/22   [provider]  lovastatin (MEVACOR) 40 MG tablet TAKE 1 TABLET BY MOUTH EVERY DAY 04/26/22   Hilty, Nadean Corwin, MD  metoprolol tartrate (LOPRESSOR) 25 MG tablet TAKE 1 & 1/2 TABLETS BY MOUTH TWICE A DAY 02/18/22   Hilty, Nadean Corwin, MD  sulfamethoxazole-trimethoprim (BACTRIM DS) 800-160 MG tablet Take 1 tablet by mouth 2 (two) times daily. Start the day prior to foley removal appointment 01/24/22   Debbrah Alar, PA-C  traMADol (ULTRAM) 50 MG tablet Take 1-2 tablets (50-100 mg total) by mouth every 6 (six) hours as needed for moderate pain or severe pain. 01/24/22   Debbrah Alar, PA-C    Physical Exam    Vital Signs:  Maxym Jimeno does not have vital signs available for review today.  Given telephonic nature of communication, physical exam is limited. AAOx3. NAD. Normal affect.  Speech and respirations are unlabored.  Accessory Clinical Findings    None  Assessment & Plan    1.  Preoperative Cardiovascular Risk Assessment: The patient is doing well from a cardiac perspective. Therefore, based on ACC/AHA guidelines, the patient would be at acceptable risk for the planned procedure without further cardiovascular testing. According to the Revised Cardiac Risk Index (RCRI), his Perioperative Risk of Major Cardiac Event is (%): 0.9. His Functional Capacity in METs is: 7.59 according to the [place] Activity Status Index (DASI).  The patient was advised that if he develops new symptoms prior to surgery to contact our office to arrange for a follow-up visit, and he verbalized understanding.  Per office protocol, patient can hold Eliquis for 3 days prior to procedure.     A copy of this note will be routed to requesting surgeon.  Time:   Today, I have spent 10 minutes with the patient with telehealth technology discussing medical history, symptoms, and management plan.    Emmaline Life, NP-C  08/16/2022,  10:07 AM [address] Office [phone] Fax [phone]

## 2022-08-16 ENCOUNTER — Encounter: Payer: Self-pay | Admitting: Nurse Practitioner

## 2022-08-16 ENCOUNTER — Ambulatory Visit: Payer: PPO | Attending: Internal Medicine | Admitting: Nurse Practitioner

## 2022-08-16 NOTE — Patient Instructions (Signed)
SURGICAL WAITING ROOM VISITATION Patients having surgery or a procedure may have no more than 2 support people in the waiting area - these visitors may rotate.    If the patient needs to stay at the hospital during part of their recovery, the visitor guidelines for inpatient rooms apply. Pre-op nurse will coordinate an appropriate time for 1 support person to accompany patient in pre-op.  This support person may not rotate.    Please refer to the [facility] website for the visitor guidelines for Inpatients (after your surgery is over and you are in a regular room).   Due to an increase in RSV and influenza rates and associated hospitalizations, children ages 85 and under may not visit patients in [place].     Your procedure is scheduled on: 09-05-22   Report to [facility] Main Entrance    Report to admitting at 10:45 AM   Call this number if you have problems the morning of surgery [phone]   Do not eat food or drink liquids :After Midnight.           If you have questions, please contact your surgeon's office.   FOLLOW ANY ADDITIONAL PRE OP INSTRUCTIONS YOU RECEIVED FROM YOUR SURGEON'S OFFICE!!!     Oral Hygiene is also important to reduce your risk of infection.                                    Remember - BRUSH YOUR TEETH THE MORNING OF SURGERY WITH YOUR REGULAR TOOTHPASTE   Do NOT smoke after Midnight   Take these medicines the morning of surgery with A SIP OF WATER:   Diltiazem  Lovastatin  Metoprolol  Bring CPAP mask and tubing day of surgery.                              You may not have any metal on your body including  jewelry, and body piercing             Do not wear lotions, powders, cologne, or deodorant               Men may shave face and neck.   Do not bring valuables to the hospital. [place].   Contacts, dentures or bridgework may not be worn into surgery.  DO NOT [place]. PHARMACY WILL DISPENSE MEDICATIONS LISTED ON YOUR MEDICATION LIST TO YOU DURING YOUR ADMISSION [place]!    Patients discharged on the day of surgery will not be allowed to drive home.  Someone NEEDS to stay with you for the first 24 hours after anesthesia.   Special Instructions: Bring a copy of your healthcare power of attorney and living will documents the day of surgery if you haven't scanned them before.              Please read over the following fact sheets you were given: IF [place]  If you received a COVID test during your pre-op visit  it is requested that you wear a mask when out in public, stay away from anyone that may not be feeling well and notify your surgeon if you develop symptoms. If you test positive for Covid or have been  in contact with anyone that has tested positive in the last 10 days please notify you surgeon.  [place] - Preparing for Surgery Before surgery, you can play an important role.  Because skin is not sterile, your skin needs to be as free of germs as possible.  You can reduce the number of germs on your skin by washing with CHG (chlorahexidine gluconate) soap before surgery.  CHG is an antiseptic cleaner which kills germs and bonds with the skin to continue killing germs even after washing. Please DO NOT use if you have an allergy to CHG or antibacterial soaps.  If your skin becomes reddened/irritated stop using the CHG and inform your nurse when you arrive at Short Stay. Do not shave (including legs and underarms) for at least 48 hours prior to the first CHG shower.  You may shave your face/neck.  Please follow these instructions carefully:  1.  Shower with CHG Soap the night before surgery and the  morning of surgery.  2.  If you choose to wash your hair, wash your hair first as usual with your normal  shampoo.  3.  After you shampoo, rinse your hair  and body thoroughly to remove the shampoo.                             4.  Use CHG as you would any other liquid soap.  You can apply chg directly to the skin and wash.  Gently with a scrungie or clean washcloth.  5.  Apply the CHG Soap to your body ONLY FROM THE NECK DOWN.   Do   not use on face/ open                           Wound or open sores. Avoid contact with eyes, ears mouth and   genitals (private parts).                       Wash face,  Genitals (private parts) with your normal soap.             6.  Wash thoroughly, paying special attention to the area where your    surgery  will be performed.  7.  Thoroughly rinse your body with warm water from the neck down.  8.  DO NOT shower/wash with your normal soap after using and rinsing off the CHG Soap.                9.  Pat yourself dry with a clean towel.            10.  Wear clean pajamas.            11.  Place clean sheets on your bed the night of your first shower and do not  sleep with pets. Day of Surgery : Do not apply any lotions/deodorants the morning of surgery.  Please wear clean clothes to the hospital/surgery center.  FAILURE TO FOLLOW THESE INSTRUCTIONS MAY RESULT IN THE CANCELLATION OF YOUR SURGERY  PATIENT SIGNATURE_________________________________  NURSE SIGNATURE__________________________________  ________________________________________________________________________

## 2022-08-21 ENCOUNTER — Other Ambulatory Visit: Payer: Self-pay

## 2022-08-21 ENCOUNTER — Encounter (HOSPITAL_COMMUNITY)
Admission: RE | Admit: 2022-08-21 | Discharge: 2022-08-21 | Disposition: A | Discharge status: Home / Self Care | Payer: PPO | Source: Ambulatory Visit | Attending: Urology | Admitting: Urology

## 2022-08-21 ENCOUNTER — Encounter (HOSPITAL_COMMUNITY): Payer: Self-pay

## 2022-08-21 VITALS — BP 134/94 | HR 77 | Temp 98.0°F | Resp 16 | Ht 68.0 in | Wt 189.8 lb

## 2022-08-21 DIAGNOSIS — Z01812 Encounter for preprocedural laboratory examination: Secondary | ICD-10-CM | POA: Insufficient documentation

## 2022-08-21 DIAGNOSIS — I10 Essential (primary) hypertension: Secondary | ICD-10-CM

## 2022-08-21 HISTORY — DX: Personal history of urinary calculi: Z87.442

## 2022-08-21 LAB — BASIC METABOLIC PANEL
Anion gap: 11 (ref 5–15)
BUN: 22 mg/dL (ref 8–23)
CO2: 28 mmol/L (ref 22–32)
Calcium: 9.6 mg/dL (ref 8.9–10.3)
Chloride: 102 mmol/L (ref 98–111)
Creatinine, Ser: 1.4 mg/dL — ABNORMAL HIGH (ref 0.61–1.24)
GFR, Estimated: 53 mL/min — ABNORMAL LOW (ref >60)
Glucose, Bld: 115 mg/dL — ABNORMAL HIGH (ref 70–99)
Potassium: 3.7 mmol/L (ref 3.5–5.1)
Sodium: 141 mmol/L (ref 135–145)

## 2022-08-21 NOTE — Progress Notes (Signed)
COVID Vaccine Completed:  Yes x2  Date of COVID positive in last 90 days:  No  PCP - Candace Gallus, MD Cardiologist - Lyman Bishop, MD  Cardiac clearance in Epic dated 08-16-22 by Christen Bame, NP-C  Chest x-ray - N/A EKG - 12-27-21 Epic Stress Test - 02-04-18 Epic ECHO - 09-17-21 Epic Cardiac Cath - N/A Pacemaker/ICD device last checked: Spinal Cord Stimulator:N/A  Bowel Prep - N/A  Sleep Study - Yes, +sleep apnea CPAP - No  Fasting Blood Sugar -  N/A Checks Blood Sugar _____ times a day  Last dose of GLP1 agonist-  N/A GLP1 instructions:  N/A   Last dose of SGLT-2 inhibitors-  N/A SGLT-2 instructions: N/A  Blood Thinner Instructions: Eliquis.  Per patient to stop 3 days prior Aspirin Instructions: Last Dose:  Activity level:  Can go up a flight of stairs and perform activities of daily living without stopping and without symptoms of chest pain or shortness of breath.  Able to exercise without symptoms  Anesthesia review:  Afib, HTN, OSA (no CPAP)  Patient denies shortness of breath, fever, cough and chest pain at PAT appointment  Patient verbalized understanding of instructions that were given to them at the PAT appointment. Patient was also instructed that they will need to review over the PAT instructions again at home before surgery.

## 2022-08-22 NOTE — Anesthesia Preprocedure Evaluation (Addendum)
Anesthesia Evaluation  Patient identified by MRN, date of birth, ID band Patient awake    Reviewed: Allergy & Precautions, NPO status , Patient's Chart, lab work & pertinent test results, reviewed documented beta blocker date and time   History of Anesthesia Complications Negative for: history of anesthetic complications  Airway Mallampati: III  TM Distance: >3 FB Neck ROM: Full    Dental  (+) Dental Advisory Given, Teeth Intact   Pulmonary sleep apnea , former smoker   Pulmonary exam normal        Cardiovascular hypertension, Pt. on medications and Pt. on home beta blockers Normal cardiovascular exam+ dysrhythmias Atrial Fibrillation      Neuro/Psych negative neurological ROS  negative psych ROS   GI/Hepatic negative GI ROS, Neg liver ROS,,,  Endo/Other  negative endocrine ROS    Renal/GU negative Renal ROS     Musculoskeletal  (+) Arthritis ,    Abdominal   Peds  Hematology  On eliquis    Anesthesia Other Findings   Reproductive/Obstetrics                             Anesthesia Physical Anesthesia Plan  ASA: 3  Anesthesia Plan: General   Post-op Pain Management: Tylenol PO (pre-op)*   Induction: Intravenous  PONV Risk Score and Plan: 2 and Treatment may vary due to age or medical condition, Ondansetron and Dexamethasone  Airway Management Planned: LMA  Additional Equipment: None  Intra-op Plan:   Post-operative Plan: Extubation in OR  Informed Consent: I have reviewed the patients History and Physical, chart, labs and discussed the procedure including the risks, benefits and alternatives for the proposed anesthesia with the patient or authorized representative who has indicated his/her understanding and acceptance.     Dental advisory given  Plan Discussed with: CRNA and Anesthesiologist  Anesthesia Plan Comments:        Anesthesia Quick Evaluation

## 2022-08-22 NOTE — Progress Notes (Signed)
Anesthesia Chart Review   Case: [id number] Date/Time: 09/05/22 1245   Procedures:      CYSTOSCOPY - 60 MINUTES NEEDED FOR CASE     TRANSURETHRAL RESECTION OF BLADDER TUMOR (TURBT)   Anesthesia type: General   Pre-op diagnosis: BLADDER TUMOR   Location: [place] / [facility]   Surgeons: Raynelle Bring, MD       DISCUSSION:71 y.o. former smoker with h/o HTN, atrial fibrillation, bladder tumor scheduled for above procedure 09/05/2022 with Dr. Raynelle Bring.   Pt last seen by cardiology 08/16/2022. Per OV note, "Preoperative Cardiovascular Risk Assessment: The patient is doing well from a cardiac perspective. Therefore, based on ACC/AHA guidelines, the patient would be at acceptable risk for the planned procedure without further cardiovascular testing. According to the Revised Cardiac Risk Index (RCRI), his Perioperative Risk of Major Cardiac Event is (%): 0.9. His Functional Capacity in METs is: 7.59 according to the [facility] Activity Status Index (DASI).   The patient was advised that if he develops new symptoms prior to surgery to contact our office to arrange for a follow-up visit, and he verbalized understanding.   Per office protocol, patient can hold Eliquis for 3 days prior to procedure."  Anticipate pt can proceed with planned procedure barring acute status change.   VS: There were no vitals taken for this visit.  PROVIDERS: Tisovec, Fransico Him, MDis PCP   Cardiologist - Lyman Bishop, MD  LABS: Labs reviewed: Acceptable for surgery. (all labs ordered are listed, but only abnormal results are displayed)  Labs Reviewed - No data to display   IMAGES:   EKG:   CV: Echo 09/17/2021  1. Left ventricular ejection fraction, by estimation, is 55 to 60%. Left  ventricular ejection fraction by 3D volume is 58 %. The left ventricle has  normal function. The left ventricle has no regional wall motion  abnormalities. Left ventricular diastolic   function could not be evaluated.   2.  Right ventricular systolic function is normal. The right ventricular  size is normal.   3. Left atrial size was mildly dilated.   4. Right atrial size was moderately dilated.   5. The mitral valve is normal in structure. Trivial mitral valve  regurgitation.   6. The aortic valve is normal in structure. Aortic valve regurgitation is  mild. No aortic stenosis is present.   Myocardial Perfusion 02/04/2018 There was no ST segment deviation noted during stress. LV function is not available because the study was not gated ( atrial fib , PVCs) The study is normal. no ischemia. No evidence of previous infarction This is a low risk study.   Past Medical History:  Diagnosis Date   Arthritis    Cancer [facility])    prostate   Dysrhythmia    afib   History of kidney stones    Hyperlipidemia    Hypertension    Longstanding persistent atrial fibrillation [facility])     Past Surgical History:  Procedure Laterality Date   BROKE LEG  11/1998   CARDIOVERSION N/A 06/08/2015   Procedure: CARDIOVERSION;  Surgeon: Pixie Casino, MD;  Location: [place];  Service: Cardiovascular;  Laterality: N/A;   CARDIOVERSION N/A 09/11/2015   Procedure: CARDIOVERSION;  Surgeon: Pixie Casino, MD;  Location: [facility] ENDOSCOPY;  Service: Cardiovascular;  Laterality: N/A;   NASAL SEPTOPLASTY W/ TURBINOPLASTY Bilateral 12/16/2016   Procedure: NASAL SEPTOPLASTY WITH BILATERAL TURBINATE REDUCTION;  Surgeon: Leta Baptist, MD;  Location: [place];  Service: ENT;  Laterality: Bilateral;  NM MYOCAR PERF WALL MOTION  05/04/2013   bruce myoview - normal stress nuclear study; a-flutter & a-fib with RVR, EF 59%   ROBOT ASSISTED LAPAROSCOPIC RADICAL PROSTATECTOMY N/A 01/24/2022   Procedure: XI ROBOTIC ASSISTED LAPAROSCOPIC RADICAL PROSTATECTOMY LEVEL 2;  Surgeon: Raynelle Bring, MD;  Location: [facility]S;  Service: Urology;  Laterality: N/A;   TOTAL HIP ARTHROPLASTY Left 06/14/2020   Procedure: TOTAL HIP ARTHROPLASTY ANTERIOR  APPROACH;  Surgeon: Rod Can, MD;  Location: [facility];  Service: Orthopedics;  Laterality: Left;    MEDICATIONS: No current facility-administered medications for this encounter.    apixaban (ELIQUIS) 5 MG TABS tablet   chlorthalidone (HYGROTON) 25 MG tablet   Cholecalciferol (VITAMIN D3) 50 MCG (2000 UT) TABS   Coenzyme Q10 300 MG CAPS   diltiazem (CARDIZEM CD) 120 MG 24 hr capsule   lovastatin (MEVACOR) 40 MG tablet   Magnesium 300 MG CAPS   metoprolol tartrate (LOPRESSOR) 25 MG tablet   Multiple Vitamins-Minerals (CENTRUM SILVER 50+MEN PO)   Omega-3 Fatty Acids (FISH OIL) 1000 MG CPDR   TURMERIC PO   Zinc 50 MG TABS    [address] [place], PA-C [facility] Pre-Surgical Testing [phone]

## 2022-09-04 NOTE — H&P (Signed)
Office Visit Report     07/31/2022   --------------------------------------------------------------------------------   Steve Weaver  MRN: [id number]  DOB: December 13, 1950, 72 year old Male  SSN:    PRIMARY CARE:  Haywood Pao, MD  PRIMARY CARE FAX:  [phone]  REFERRING:  Luvenia Redden  PROVIDER:  Wendie Simmer, M.D.  TREATING:  Raynelle Bring, M.D.  LOCATION:  [facility] [phone]     --------------------------------------------------------------------------------   CC/HPI: Steve Weaver hematuria   Steve Weaver returns today for further evaluation of his gross hematuria. This has continued to persist. He follows up today after undergoing a hematuria protocol CT scan. He has remained on Eliquis for atrial fibrillation.     ALLERGIES: No Known Drug Allergies    MEDICATIONS: Metoprolol Tartrate  Chlorthalidone 25 mg tablet  Coq10  Diltiazem 12Hr Er 120 mg capsule, extended release 12 hr  Eliquis 5 mg tablet  Fish Oil  Lovastatin  Magnesium  Multivitamin  Vitamin D3  Zinc     GU PSH: Laparoscopy; Lymphadenectomy - 01/24/2022 Locm 300-399Mg /Ml Iodine,1Ml - 07/25/2022 Prostate Needle Biopsy - 11/02/2021 Robotic Radical Prostatectomy - 01/24/2022       PSH Notes: right tib fib fracture and repair 2000  Vasectomy 20 years ago  septoplasty 2018     NON-GU PSH: Hip Replacement, Left Surgical Pathology, Gross And Microscopic Examination For Prostate Needle - 11/02/2021     GU PMH: Gross hematuria - 07/25/2022, - 06/14/2022 Acute Cystitis/UTI - 06/14/2022 Stress Incontinence - 05/20/2022, - 05/20/2022, - 04/24/2022, - 04/17/2022, - 03/28/2022, - 03/06/2022, - 02/20/2022, - 01/30/2022, - 01/09/2022, - 12/26/2021 ED due to arterial insufficiency - 04/24/2022, - 01/30/2022, - 11/09/2021, - 07/06/2021 Prostate Cancer - 04/24/2022, - 01/30/2022, - 01/15/2022, - 12/17/2021, - 12/07/2021, - 11/09/2021 Prostate nodule w/o LUTS - 11/09/2021, - 11/02/2021, -  07/06/2021 Encounter for Prostate Cancer screening - 07/06/2021      PMH Notes:   1) Prostate cancer: He is s/p a UNS RAL radical prostatectomy and BPLND on 01/24/22.   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   Feb 2024: CT imaging - , cystoscopy -    NON-GU PMH: Muscle weakness (generalized) - 05/20/2022, - 05/20/2022, - 04/17/2022 Other muscle spasm - 05/20/2022, - 05/20/2022, - 04/17/2022 Atrial Fibrillation Hypercholesterolemia Hypertension    FAMILY HISTORY: Diabetes - Mother   SOCIAL HISTORY: Marital Status: Married Current Smoking Status: Patient has never smoked.   Tobacco Use Assessment Completed: Used Tobacco in last 30 days? Does not use smokeless tobacco. Does drink.  Does not use drugs. Drinks 1 caffeinated drink per day. Has not had a blood transfusion.     Notes: Liquor 1 drink per day most days of the week    REVIEW OF SYSTEMS:    GU Review Male:   Patient denies frequent urination, hard to postpone urination, burning/ pain with urination, get up at night to urinate, leakage of urine, stream starts and stops, trouble starting your streams, and have to strain to urinate .  Gastrointestinal (Lower):   Patient denies diarrhea and constipation.  Gastrointestinal (Upper):   Patient denies nausea and vomiting.  Constitutional:   Patient denies fever, night sweats, weight loss, and fatigue.  Skin:   Patient denies skin rash/ lesion and itching.  Eyes:   Patient  denies blurred vision and double vision.  Ears/ Nose/ Throat:   Patient denies sore throat and sinus problems.  Hematologic/Lymphatic:   Patient denies swollen glands and easy bruising.  Cardiovascular:   Patient denies leg swelling and chest pains.  Respiratory:   Patient denies  cough and shortness of breath.  Endocrine:   Patient denies excessive thirst.  Musculoskeletal:   Patient denies back pain and joint pain.  Neurological:   Patient denies headaches and dizziness.  Psychologic:   Patient denies depression and anxiety.   VITAL SIGNS: None   MULTI-SYSTEM PHYSICAL EXAMINATION:    Constitutional: Well-nourished. No physical deformities. Normally developed. Good grooming.  Respiratory: No labored breathing, no use of accessory muscles. Clear bilaterally.  Cardiovascular: Normal temperature, normal extremity pulses, no swelling, no varicosities. Regular rate and rhythm.     Complexity of Data:  Records Review:   Previous Patient Records  X-Ray Review: C.T. Abdomen/Pelvis: Reviewed Films.     04/17/22 07/06/21  PSA  Total PSA <0.015 ng/mL 3.75 ng/mL   Notes:                     CT ABDOMEN AND PELVIS WITHOUT AND WITH CONTRAST 07/25/2022   --------------------------------------------------------------------------------   Karoline Caldwell  MRN: [id number] Ordering Provider: Raynelle Bring, JR  DOB: 05/03/1951 Date Collected: 07/25/2022 13:15:00  SSN: -**- Date Completed: 07/26/2022 08:26:47   --------------------------------------------------------------------------------   CLINICAL DATA: Gross hematuria   EXAM:  CT ABDOMEN AND PELVIS WITHOUT AND WITH CONTRAST   TECHNIQUE:  Multidetector CT imaging of the abdomen and pelvis was performed  following the standard protocol before and following the bolus  administration of intravenous contrast.   RADIATION DOSE REDUCTION: This exam was performed according to the  departmental dose-optimization program which includes automated  exposure control, adjustment of the mA and/or kV according to  patient size and/or use of iterative reconstruction technique.   CONTRAST: 125 mL Omnipaque 300 IV   COMPARISON: Prostate cancer dated 11/20/2021   FINDINGS:  Lower chest: Lung bases are clear.   Hepatobiliary: Liver  is within normal limits.   Gallbladder is unremarkable. No intrahepatic or extrahepatic duct  dilatation.   Pancreas: Within normal limits.   Spleen: Within normal limits.   Adrenals/Urinary Tract: Adrenal glands are within normal limits.   Kidneys are within normal limits. No renal, ureteral, or bladder  calculi. No hydronephrosis.   On delayed imaging, there are no filling defects in the bilateral  opacified proximal collecting systems, ureters, or bladder.   Thick-walled bladder, although underdistended.   Stomach/Bowel: Stomach is within normal limits.   No evidence of bowel obstruction.   Normal appendix (series 5/image 64).   Mild sigmoid diverticulosis, without evidence of diverticulitis.   Vascular/Lymphatic: No evidence of abdominal aortic aneurysm.   Atherosclerotic calcifications of the abdominal aorta and branch  vessels.   No suspicious abdominopelvic lymphadenopathy.   Reproductive: Status post prostatectomy.   Other: No abdominopelvic ascites.   Musculoskeletal: Degenerative changes of the visualized  thoracolumbar spine. No focal osseous lesions. Left hip  arthroplasty.   IMPRESSION:  Status post prostatectomy. No evidence of recurrent or metastatic  disease.   Thick-walled bladder, although underdistended.   No CT findings to account for the patient's hematuria.    Electronically Signed  By: Julian Hy M.D.  On: 07/26/2022 08:24    PROCEDURES:         Flexible Cystoscopy - 52000  Indication: Gross hematuria Risks, benefits, and potential complications of the procedure  were discussed with the patient including infection, bleeding, voiding discomfort, urinary retention, fever, chills, sepsis, and others. All questions were answered. Informed consent was obtained. Sterile technique and intraurethral analgesia were used.  Meatus:  Normal size. Normal location. Normal condition.  Urethra:  No strictures.  External Sphincter:  Normal.   Verumontanum:  Verumontanum Surgically Absent.  Prostate:  Prostate Surgically Absent.  Bladder Neck:  At the bladder neck, there is a smooth polyp appearing mass extending from the anterior bladder neck and partially obstructing the bladder neck. This does have an area of active bleeding. This is not a papillary tumor.  Ureteral Orifices:  Normal location. Normal size. Normal shape. Effluxed clear urine.  Bladder:  No trabeculation. No tumors. Normal mucosa. No stones.      Chaperone: JA The procedure was well-tolerated and without complications. Instructions were given to call the office immediately if questions or problems.         Urinalysis w/Scope - 81001 Dipstick Dipstick Cont'd Micro  Specimen: Voided Bilirubin: Neg WBC/hpf: 0 - 5/hpf  Color: Brown Ketones: Trace RBC/hpf: 40 - 60/hpf  Appearance: Slightly Cloudy Blood: 3+ Bacteria: NS (Not Seen)  Specific Gravity: 1.025 Protein: 1+ Cystals: Amorph Urates  pH: 5.5 Urobilinogen: 0.2 Casts: NS (Not Seen)  Glucose: Neg Nitrites: Neg Trichomonas: Not Present    Leukocyte Esterase: 1+ Mucous: Not Present      Epithelial Cells: NS (Not Seen)      Yeast: NS (Not Seen)      Sperm: Not Present    Notes:  too bloody to dip or spin     ASSESSMENT:      ICD-10 Details  1 GU:   Gross hematuria - R31.0   2   Bladder tumor/neoplasm - D41.4    PLAN:           Schedule Return Visit/Planned Activity: Other See Visit Notes             Note: Will call to schedule surgery.          Document Letter(s):  Created for Patient: Clinical Summary         Notes:   1. Bladder tumor: We reviewed his CT scan which does not suggest any concerning findings of his upper urinary tract to explain his hematuria. On cystoscopy, he was noted to have a polyp appearing tumor at the bladder neck which is actively bleeding and appears to be the source for his hematuria. This does not appear to be malignant most likely based on visualization although I  did recommend proceeding with cystoscopy and transurethral resection of this mass both to stop his bleeding and for a definitive diagnosis. We have reviewed this procedure in detail including the potential risks, complications, and expected recovery process. He will stop Eliquis 48 hours prior to his procedure. This will be scheduled for the near future.   2. Prostate cancer: He will keep his scheduled follow-up in May.   CC: Dr. Domenick Gong        Next Appointment:      Next Appointment: 10/30/2022 10:00 AM    Appointment Type: Laboratory Appointment    Location: [facility] [phone]    Provider: Lab LAB    Reason for Visit: 6 mo psa      * Signed by Raynelle Bring, M.D. on 07/31/22 at 5:40 PM (EST)*

## 2022-09-04 NOTE — H&P (Incomplete Revision)
--------------------------------------------------------------------------------   Karoline Caldwell  [id number]  DOB: 08-15-1950, 72 year old Male  SSN:    PRIMARY CARE:  Haywood Pao, MD  PRIMARY CARE FAX:  [phone]  REFERRING:  Luvenia Redden  PROVIDER:  Wendie Simmer, M.D.  TREATING:  Raynelle Bring, M.D.  LOCATION:  [facility] [phone]     --------------------------------------------------------------------------------   CC/HPI: Johney Maine hematuria   [name] returns today for further evaluation of his gross hematuria. This has continued to persist. He follows up today after undergoing a hematuria protocol CT scan. He has remained on Eliquis for atrial fibrillation.     ALLERGIES: No Known Drug Allergies    MEDICATIONS: Metoprolol Tartrate  Chlorthalidone 25 mg tablet  Coq10  Diltiazem 12Hr Er 120 mg capsule, extended release 12 hr  Eliquis 5 mg tablet  Fish Oil  Lovastatin  Magnesium  Multivitamin  Vitamin D3  Zinc     GU PSH: Laparoscopy; Lymphadenectomy - 01/24/2022 Locm 300-399Mg /Ml Iodine,1Ml - 07/25/2022 Prostate Needle Biopsy - 11/02/2021 Robotic Radical Prostatectomy - 01/24/2022       PSH Notes: right tib fib fracture and repair 2000  Vasectomy 20 years ago  septoplasty 2018     NON-GU PSH: Hip Replacement, Left Surgical Pathology, Gross And Microscopic Examination For Prostate Needle - 11/02/2021     GU PMH: Gross hematuria - 07/25/2022, - 06/14/2022 Acute Cystitis/UTI - 06/14/2022 Stress Incontinence - 05/20/2022, - 05/20/2022, - 04/24/2022, - 04/17/2022, - 03/28/2022, - 03/06/2022, - 02/20/2022, - 01/30/2022, - 01/09/2022, - 12/26/2021 ED due to arterial insufficiency - 04/24/2022, - 01/30/2022, - 11/09/2021, - 07/06/2021 Prostate Cancer - 04/24/2022, - 01/30/2022, - 01/15/2022, - 12/17/2021, - 12/07/2021, - 11/09/2021 Prostate nodule w/o LUTS - 11/09/2021, - 11/02/2021, - 07/06/2021 Encounter for Prostate Cancer screening -  07/06/2021      PMH Notes:   1) Prostate cancer: He is s/p a UNS RAL radical prostatectomy and BPLND on 01/24/22.   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   Feb 2024: CT imaging - , cystoscopy -    NON-GU PMH: Muscle weakness (generalized) - 05/20/2022, - 05/20/2022, - 04/17/2022 Other muscle spasm - 05/20/2022, - 05/20/2022, - 04/17/2022 Atrial Fibrillation Hypercholesterolemia Hypertension    FAMILY HISTORY: Diabetes - Mother   SOCIAL HISTORY: Marital Status: Married Current Smoking Status: Patient has never smoked.   Tobacco Use Assessment Completed: Used Tobacco in last 30 days? Does not use smokeless tobacco. Does drink.  Does not use drugs. Drinks 1 caffeinated drink per day. Has not had a blood transfusion.     Notes: Liquor 1 drink per day most days of the week    REVIEW OF SYSTEMS:    GU Review Male:   Patient denies frequent urination, hard to postpone urination, burning/ pain with urination, get up at night to urinate, leakage of urine, stream starts and stops, trouble starting your streams, and have to strain to urinate .  Gastrointestinal (Lower):   Patient denies diarrhea and constipation.  Gastrointestinal (Upper):   Patient denies nausea and vomiting.  Constitutional:   Patient denies fever, night sweats, weight loss, and fatigue.  Skin:   Patient denies skin rash/ lesion and itching.  Eyes:   Patient denies blurred vision and double vision.  Ears/  Nose/ Throat:   Patient denies sore throat and sinus problems.  Hematologic/Lymphatic:   Patient denies swollen glands and easy bruising.  Cardiovascular:   Patient denies leg swelling and chest pains.  Respiratory:   Patient denies cough and shortness of breath.  Endocrine:   Patient  denies excessive thirst.  Musculoskeletal:   Patient denies back pain and joint pain.  Neurological:   Patient denies headaches and dizziness.  Psychologic:   Patient denies depression and anxiety.   VITAL SIGNS: None   MULTI-SYSTEM PHYSICAL EXAMINATION:    Constitutional: Well-nourished. No physical deformities. Normally developed. Good grooming.  Respiratory: No labored breathing, no use of accessory muscles. Clear bilaterally.  Cardiovascular: Normal temperature, normal extremity pulses, no swelling, no varicosities. Regular rate and rhythm.     Complexity of Data:  Records Review:   Previous Patient Records  X-Ray Review: C.T. Abdomen/Pelvis: Reviewed Films.     04/17/22 07/06/21  PSA  Total PSA <0.015 ng/mL 3.75 ng/mL   Notes:                     CT ABDOMEN AND PELVIS WITHOUT AND WITH CONTRAST 07/25/2022   --------------------------------------------------------------------------------   Karoline Caldwell  MRN: [id number] Ordering Provider: Raynelle Bring, JR  DOB: [birth year] Date Collected: 07/25/2022 13:15:00  SSN: -**- Date Completed: 07/26/2022 08:26:47   --------------------------------------------------------------------------------   CLINICAL DATA: Gross hematuria   EXAM:  CT ABDOMEN AND PELVIS WITHOUT AND WITH CONTRAST   TECHNIQUE:  Multidetector CT imaging of the abdomen and pelvis was performed  following the standard protocol before and following the bolus  administration of intravenous contrast.   RADIATION DOSE REDUCTION: This exam was performed according to the  departmental dose-optimization program which includes automated  exposure control, adjustment of the mA and/or kV according to  patient size and/or use of iterative reconstruction technique.   CONTRAST: 125 mL Omnipaque 300 IV   COMPARISON: Prostate cancer dated 11/20/2021   FINDINGS:  Lower chest: Lung bases are clear.   Hepatobiliary: Liver is within normal limits.   Gallbladder is  unremarkable. No intrahepatic or extrahepatic duct  dilatation.   Pancreas: Within normal limits.   Spleen: Within normal limits.   Adrenals/Urinary Tract: Adrenal glands are within normal limits.   Kidneys are within normal limits. No renal, ureteral, or bladder  calculi. No hydronephrosis.   On delayed imaging, there are no filling defects in the bilateral  opacified proximal collecting systems, ureters, or bladder.   Thick-walled bladder, although underdistended.   Stomach/Bowel: Stomach is within normal limits.   No evidence of bowel obstruction.   Normal appendix (series 5/image 64).   Mild sigmoid diverticulosis, without evidence of diverticulitis.   Vascular/Lymphatic: No evidence of abdominal aortic aneurysm.   Atherosclerotic calcifications of the abdominal aorta and branch  vessels.   No suspicious abdominopelvic lymphadenopathy.   Reproductive: Status post prostatectomy.   Other: No abdominopelvic ascites.   Musculoskeletal: Degenerative changes of the visualized  thoracolumbar spine. No focal osseous lesions. Left hip  arthroplasty.   IMPRESSION:  Status post prostatectomy. No evidence of recurrent or metastatic  disease.   Thick-walled bladder, although underdistended.   No CT findings to account for the patient's hematuria.    Electronically Signed  By: Julian Hy M.D.  On: 07/26/2022 08:24    PROCEDURES:         Flexible Cystoscopy - 52000  Indication: Gross hematuria Risks, benefits, and potential complications of the procedure were discussed with the patient including infection, bleeding,  voiding discomfort, urinary retention, fever, chills, sepsis, and others. All questions were answered. Informed consent was obtained. Sterile technique and intraurethral analgesia were used.  Meatus:  Normal size. Normal location. Normal condition.  Urethra:  No strictures.  External Sphincter:  Normal.  Verumontanum:  Verumontanum Surgically Absent.   Prostate:  Prostate Surgically Absent.  Bladder Neck:  At the bladder neck, there is a smooth polyp appearing mass extending from the anterior bladder neck and partially obstructing the bladder neck. This does have an area of active bleeding. This is not a papillary tumor.  Ureteral Orifices:  Normal location. Normal size. Normal shape. Effluxed clear urine.  Bladder:  No trabeculation. No tumors. Normal mucosa. No stones.      Chaperone: JA The procedure was well-tolerated and without complications. Instructions were given to call the office immediately if questions or problems.         Urinalysis w/Scope - 81001 Dipstick Dipstick Cont'd Micro  Specimen: Voided Bilirubin: Neg WBC/hpf: 0 - 5/hpf  Color: Brown Ketones: Trace RBC/hpf: 40 - 60/hpf  Appearance: Slightly Cloudy Blood: 3+ Bacteria: NS (Not Seen)  Specific Gravity: 1.025 Protein: 1+ Cystals: Amorph Urates  pH: 5.5 Urobilinogen: 0.2 Casts: NS (Not Seen)  Glucose: Neg Nitrites: Neg Trichomonas: Not Present    Leukocyte Esterase: 1+ Mucous: Not Present      Epithelial Cells: NS (Not Seen)      Yeast: NS (Not Seen)      Sperm: Not Present    Notes:  too bloody to dip or spin     ASSESSMENT:      ICD-10 Details  1 GU:   Gross hematuria - R31.0   2   Bladder tumor/neoplasm - D41.4    PLAN:           Schedule Return Visit/Planned Activity: Other See Visit Notes             Note: Will call to schedule surgery.          Document Letter(s):  Created for Patient: Clinical Summary         Notes:   1. Bladder tumor: We reviewed his CT scan which does not suggest any concerning findings of his upper urinary tract to explain his hematuria. On cystoscopy, he was noted to have a polyp appearing tumor at the bladder neck which is actively bleeding and appears to be the source for his hematuria. This does not appear to be malignant most likely based on visualization although I did recommend proceeding with cystoscopy and  transurethral resection of this mass both to stop his bleeding and for a definitive diagnosis. We have reviewed this procedure in detail including the potential risks, complications, and expected recovery process. He will stop Eliquis 48 hours prior to his procedure. This will be scheduled for the near future.   2. Prostate cancer: He will keep his scheduled follow-up in May.   CC: Dr. Domenick Gong        Next Appointment:      Next Appointment: 10/30/2022 10:00 AM    Appointment Type: Laboratory Appointment    Location: [facility] [phone]    Provider: Lab LAB    Reason for Visit: 6 mo psa

## 2022-09-05 ENCOUNTER — Ambulatory Visit (HOSPITAL_BASED_OUTPATIENT_CLINIC_OR_DEPARTMENT_OTHER): Payer: PPO | Admitting: Physician Assistant

## 2022-09-05 ENCOUNTER — Ambulatory Visit (HOSPITAL_COMMUNITY): Payer: PPO | Admitting: Physician Assistant

## 2022-09-05 ENCOUNTER — Encounter (HOSPITAL_COMMUNITY)
Admission: RE | Disposition: A | Discharge status: Home / Self Care | Payer: Self-pay | Source: Ambulatory Visit | Attending: Urology

## 2022-09-05 ENCOUNTER — Encounter (HOSPITAL_COMMUNITY): Payer: Self-pay | Admitting: Urology

## 2022-09-05 ENCOUNTER — Ambulatory Visit (HOSPITAL_COMMUNITY)
Admission: RE | Admit: 2022-09-05 | Discharge: 2022-09-05 | Disposition: A | Discharge status: Home / Self Care | Payer: PPO | Source: Ambulatory Visit | Attending: Urology | Admitting: Urology

## 2022-09-05 DIAGNOSIS — Z7901 Long term (current) use of anticoagulants: Secondary | ICD-10-CM | POA: Diagnosis not present

## 2022-09-05 DIAGNOSIS — N329 Bladder disorder, unspecified: Secondary | ICD-10-CM | POA: Diagnosis not present

## 2022-09-05 DIAGNOSIS — I4811 Longstanding persistent atrial fibrillation: Secondary | ICD-10-CM | POA: Insufficient documentation

## 2022-09-05 DIAGNOSIS — N3289 Other specified disorders of bladder: Secondary | ICD-10-CM | POA: Diagnosis not present

## 2022-09-05 DIAGNOSIS — Z79899 Other long term (current) drug therapy: Secondary | ICD-10-CM | POA: Diagnosis not present

## 2022-09-05 DIAGNOSIS — I10 Essential (primary) hypertension: Secondary | ICD-10-CM | POA: Insufficient documentation

## 2022-09-05 DIAGNOSIS — N3081 Other cystitis with hematuria: Secondary | ICD-10-CM | POA: Diagnosis not present

## 2022-09-05 DIAGNOSIS — Z87891 Personal history of nicotine dependence: Secondary | ICD-10-CM

## 2022-09-05 DIAGNOSIS — I4891 Unspecified atrial fibrillation: Secondary | ICD-10-CM | POA: Diagnosis not present

## 2022-09-05 DIAGNOSIS — N3080 Other cystitis without hematuria: Secondary | ICD-10-CM | POA: Diagnosis not present

## 2022-09-05 DIAGNOSIS — Z8546 Personal history of malignant neoplasm of prostate: Secondary | ICD-10-CM | POA: Diagnosis not present

## 2022-09-05 DIAGNOSIS — G4730 Sleep apnea, unspecified: Secondary | ICD-10-CM | POA: Insufficient documentation

## 2022-09-05 DIAGNOSIS — D494 Neoplasm of unspecified behavior of bladder: Secondary | ICD-10-CM | POA: Diagnosis not present

## 2022-09-05 HISTORY — PX: TRANSURETHRAL RESECTION OF BLADDER TUMOR: SHX2575

## 2022-09-05 HISTORY — PX: CYSTOSCOPY: SHX5120

## 2022-09-05 SURGERY — CYSTOSCOPY
Anesthesia: General | Site: Bladder

## 2022-09-05 MED ORDER — LACTATED RINGERS IV SOLN: INTRAVENOUS | Status: DC

## 2022-09-05 MED ORDER — ONDANSETRON HCL 4 MG/2ML IJ SOLN
INTRAMUSCULAR | Status: AC
  Filled 2022-09-05: qty 2

## 2022-09-05 MED ORDER — LIDOCAINE HCL (PF) 2 % IJ SOLN
INTRAMUSCULAR | Status: AC
  Filled 2022-09-05: qty 5

## 2022-09-05 MED ORDER — EPHEDRINE SULFATE-NACL 50-0.9 MG/10ML-% IV SOSY
PREFILLED_SYRINGE | INTRAVENOUS | Status: DC | PRN
  Administered 2022-09-05: 10 mg via INTRAVENOUS

## 2022-09-05 MED ORDER — PROPOFOL 500 MG/50ML IV EMUL
INTRAVENOUS | Status: DC | PRN
  Administered 2022-09-05: 150 ug/kg/min via INTRAVENOUS

## 2022-09-05 MED ORDER — OXYCODONE HCL 5 MG PO TABS: 5.0000 mg | ORAL_TABLET | Freq: Once | ORAL | Status: DC | PRN

## 2022-09-05 MED ORDER — SODIUM CHLORIDE 0.9 % IR SOLN
Status: DC | PRN
  Administered 2022-09-05: 3000 mL

## 2022-09-05 MED ORDER — OXYCODONE HCL 5 MG/5ML PO SOLN: 5.0000 mg | Freq: Once | ORAL | Status: DC | PRN

## 2022-09-05 MED ORDER — ACETAMINOPHEN 500 MG PO TABS
1000.0000 mg | ORAL_TABLET | Freq: Once | ORAL | Status: AC
  Administered 2022-09-05: 1000 mg via ORAL
  Filled 2022-09-05: qty 2

## 2022-09-05 MED ORDER — PROPOFOL 10 MG/ML IV BOLUS
INTRAVENOUS | Status: DC | PRN
  Administered 2022-09-05: 150 mg via INTRAVENOUS

## 2022-09-05 MED ORDER — PROPOFOL 10 MG/ML IV BOLUS
INTRAVENOUS | Status: AC
  Filled 2022-09-05: qty 20

## 2022-09-05 MED ORDER — 0.9 % SODIUM CHLORIDE (POUR BTL) OPTIME
TOPICAL | Status: DC | PRN
  Administered 2022-09-05: 1000 mL

## 2022-09-05 MED ORDER — DEXAMETHASONE SODIUM PHOSPHATE 10 MG/ML IJ SOLN
INTRAMUSCULAR | Status: AC
  Filled 2022-09-05: qty 1

## 2022-09-05 MED ORDER — FENTANYL CITRATE (PF) 100 MCG/2ML IJ SOLN
INTRAMUSCULAR | Status: AC
  Filled 2022-09-05: qty 2

## 2022-09-05 MED ORDER — CEFAZOLIN SODIUM-DEXTROSE 2-4 GM/100ML-% IV SOLN
2.0000 g | INTRAVENOUS | Status: AC
  Administered 2022-09-05: 2 g via INTRAVENOUS
  Filled 2022-09-05: qty 100

## 2022-09-05 MED ORDER — LIDOCAINE 2% (20 MG/ML) 5 ML SYRINGE
INTRAMUSCULAR | Status: DC | PRN
  Administered 2022-09-05: 60 mg via INTRAVENOUS

## 2022-09-05 MED ORDER — ORAL CARE MOUTH RINSE: 15.0000 mL | Freq: Once | OROMUCOSAL | Status: AC

## 2022-09-05 MED ORDER — ONDANSETRON HCL 4 MG/2ML IJ SOLN: 4.0000 mg | Freq: Once | INTRAMUSCULAR | Status: DC | PRN

## 2022-09-05 MED ORDER — ONDANSETRON HCL 4 MG/2ML IJ SOLN
INTRAMUSCULAR | Status: DC | PRN
  Administered 2022-09-05: 4 mg via INTRAVENOUS

## 2022-09-05 MED ORDER — CHLORHEXIDINE GLUCONATE 0.12 % MT SOLN
15.0000 mL | Freq: Once | OROMUCOSAL | Status: AC
  Administered 2022-09-05: 15 mL via OROMUCOSAL

## 2022-09-05 MED ORDER — PHENAZOPYRIDINE HCL 200 MG PO TABS: 200.0000 mg | ORAL_TABLET | Freq: Three times a day (TID) | ORAL | 0 refills | Status: AC | PRN

## 2022-09-05 MED ORDER — FENTANYL CITRATE PF 50 MCG/ML IJ SOSY: 25.0000 ug | PREFILLED_SYRINGE | INTRAMUSCULAR | Status: DC | PRN

## 2022-09-05 MED ORDER — FENTANYL CITRATE (PF) 100 MCG/2ML IJ SOLN
INTRAMUSCULAR | Status: DC | PRN
  Administered 2022-09-05: 50 ug via INTRAVENOUS

## 2022-09-05 SURGICAL SUPPLY — 21 items
BAG DRN RND TRDRP ANRFLXCHMBR (UROLOGICAL SUPPLIES)
BAG URINE DRAIN 2000ML AR STRL (UROLOGICAL SUPPLIES) IMPLANT
BAG URO CATCHER STRL LF (MISCELLANEOUS) ×2 IMPLANT
CATH URETL OPEN END 6FR 70 (CATHETERS) IMPLANT
CLOTH BEACON ORANGE TIMEOUT ST (SAFETY) ×2 IMPLANT
DRAPE FOOT SWITCH (DRAPES) ×2 IMPLANT
ELECT REM PT RETURN 15FT ADLT (MISCELLANEOUS) ×2 IMPLANT
GLOVE SURG LX STRL 7.5 STRW (GLOVE) ×2 IMPLANT
GOWN STRL REUS W/ TWL XL LVL3 (GOWN DISPOSABLE) ×2 IMPLANT
GOWN STRL REUS W/TWL XL LVL3 (GOWN DISPOSABLE) ×1
GUIDEWIRE STR DUAL SENSOR (WIRE) ×2 IMPLANT
GUIDEWIRE ZIPWRE .038 STRAIGHT (WIRE) IMPLANT
KIT TURNOVER KIT A (KITS) IMPLANT
LOOP CUT BIPOLAR 24F LRG (ELECTROSURGICAL) IMPLANT
MANIFOLD NEPTUNE II (INSTRUMENTS) ×2 IMPLANT
PACK CYSTO (CUSTOM PROCEDURE TRAY) ×2 IMPLANT
PENCIL SMOKE EVACUATOR (MISCELLANEOUS) IMPLANT
SYR TOOMEY IRRIG 70ML (MISCELLANEOUS)
SYRINGE TOOMEY IRRIG 70ML (MISCELLANEOUS) IMPLANT
TUBING CONNECTING 10 (TUBING) ×2 IMPLANT
TUBING UROLOGY SET (TUBING) ×2 IMPLANT

## 2022-09-05 NOTE — Op Note (Addendum)
Preoperative diagnosis: Bladder neck lesion  Postoperative diagnosis: Bladder neck lesion  Procedure: Cystoscopy with transurethral resection of bladder neck lesion (1.0 cm)  Surgeon: Pryor Curia MD  Anesthesia: General  Complications: None  EBL: Minimal  Specimen: Bladder neck lesion  Disposition of specimen: Pathology  Indication: [name] is a 72 year old gentleman with a history of prostate cancer status post robotic radical prostatectomy less than a year ago.  He had persistent hematuria and underwent an evaluation which revealed a polypoid lesion at the bladder neck which did appear to be actively bleeding on office cystoscopy.  No other abnormalities were identified.  After discussing these findings, it was recommended that he undergo the above procedures.  The potential risks, complications, and expected recovery process was discussed in detail.  Informed consent was obtained.  Description of procedure: The patient was taken to the operating room and a general anesthetic was administered.  He was given preoperative antibiotics, placed in the dorsolithotomy position, and prepped and draped in usual sterile fashion.  Next, a preoperative timeout was performed.  Cystourethroscopy was then performed with a 30 degree lens.  This revealed a normal anterior urethra.  The prostatic urethra was surgically absent.  At the bladder neck, there was a smooth and polypoid lesion as previously noted at approximately 2:00 that appeared to be mobile and extending into the bladder.  Systematic examination of the bladder revealed no other mucosal abnormalities.  The cystoscope was then removed and replaced with the 26 French resectoscope.  This lesion was able to be transurethrally resected off the bladder neck.  Hemostasis was achieved with bipolar cautery.  The lesion was removed intact and sent for permanent pathologic analysis.  Reinspection of the bladder and the urethra revealed no  abnormalities.  The bladder was emptied.  He tolerated the procedure well without complications.

## 2022-09-05 NOTE — Anesthesia Postprocedure Evaluation (Signed)
Anesthesia Post Note  Patient: Steve Weaver  Procedure(s) Performed: CYSTOSCOPY (Bladder) TRANSURETHRAL RESECTION OF BLADDER TUMOR (TURBT) (Bladder)     Patient location during evaluation: PACU Anesthesia Type: General Level of consciousness: awake and alert Pain management: pain level controlled Vital Signs Assessment: post-procedure vital signs reviewed and stable Respiratory status: spontaneous breathing, nonlabored ventilation and respiratory function stable Cardiovascular status: stable and blood pressure returned to baseline Anesthetic complications: no   No notable events documented.  Last Vitals:  Vitals:   09/05/22 1345 09/05/22 1400  BP: 113/64 109/78  Pulse: 76 77  Resp: 14 18  Temp:  36.5 C  SpO2: 99% 97%    Last Pain:  Vitals:   09/05/22 1400  TempSrc:   PainSc: 0-No pain                 Audry Pili

## 2022-09-05 NOTE — Anesthesia Procedure Notes (Signed)
Procedure Name: LMA Insertion Date/Time: 09/05/2022 1:02 PM  Performed by: Eben Burow, CRNAPre-anesthesia Checklist: Patient identified, Emergency Drugs available, Suction available, Patient being monitored and Timeout performed Patient Re-evaluated:Patient Re-evaluated prior to induction Oxygen Delivery Method: Circle system utilized Preoxygenation: Pre-oxygenation with 100% oxygen Induction Type: IV induction Ventilation: Mask ventilation without difficulty LMA: LMA inserted LMA Size: 4.0 Number of attempts: 1 Tube secured with: Tape Dental Injury: Teeth and Oropharynx as per pre-operative assessment

## 2022-09-05 NOTE — Discharge Instructions (Signed)
You may see some blood in the urine and may have some burning with urination for 48-72 hours. You also may notice that you have to urinate more frequently or urgently after your procedure which is normal.  You should call should you develop an inability urinate, fever > 101, persistent nausea and vomiting that prevents you from eating or drinking to stay hydrated.    

## 2022-09-05 NOTE — Transfer of Care (Signed)
Immediate Anesthesia Transfer of Care Note  Patient: Vitali Cianflone  Procedure(s) Performed: CYSTOSCOPY (Bladder) TRANSURETHRAL RESECTION OF BLADDER TUMOR (TURBT) (Bladder)  Patient Location: PACU  Anesthesia Type:General  Level of Consciousness: awake, alert , and patient cooperative  Airway & Oxygen Therapy: Patient Spontanous Breathing and Patient connected to face mask oxygen  Post-op Assessment: Report given to RN and Post -op Vital signs reviewed and stable  Post vital signs: Reviewed and stable  Last Vitals:  Vitals Value Taken Time  BP 115/69 09/05/22 1338  Temp 36.5 C 09/05/22 1338  Pulse 67 09/05/22 1340  Resp 16 09/05/22 1340  SpO2 100 % 09/05/22 1340  Vitals shown include unvalidated device data.  Last Pain:  Vitals:   09/05/22 1338  TempSrc:   PainSc: 0-No pain         Complications: No notable events documented.

## 2022-09-06 ENCOUNTER — Encounter (HOSPITAL_COMMUNITY): Payer: Self-pay | Admitting: Urology

## 2022-09-09 LAB — SURGICAL PATHOLOGY

## 2022-09-13 ENCOUNTER — Other Ambulatory Visit: Payer: Self-pay | Admitting: Internal Medicine

## 2022-09-13 DIAGNOSIS — I4891 Unspecified atrial fibrillation: Secondary | ICD-10-CM

## 2022-09-13 NOTE — Telephone Encounter (Signed)
Prescription refill request for Eliquis received. Indication: a fib Last office visit: 08/16/22 Scr: 1.4 08/21/22 epic Age: 72 Weight: 88kg

## 2022-09-17 DIAGNOSIS — D414 Neoplasm of uncertain behavior of bladder: Secondary | ICD-10-CM | POA: Diagnosis not present

## 2022-10-03 ENCOUNTER — Other Ambulatory Visit: Payer: Self-pay | Admitting: Internal Medicine

## 2022-10-07 ENCOUNTER — Other Ambulatory Visit: Payer: Self-pay | Admitting: Internal Medicine

## 2022-10-07 DIAGNOSIS — I4892 Unspecified atrial flutter: Secondary | ICD-10-CM

## 2022-10-23 DIAGNOSIS — H25043 Posterior subcapsular polar age-related cataract, bilateral: Secondary | ICD-10-CM | POA: Diagnosis not present

## 2022-10-30 DIAGNOSIS — C61 Malignant neoplasm of prostate: Secondary | ICD-10-CM | POA: Diagnosis not present

## 2022-11-06 DIAGNOSIS — N5201 Erectile dysfunction due to arterial insufficiency: Secondary | ICD-10-CM | POA: Diagnosis not present

## 2022-11-06 DIAGNOSIS — N393 Stress incontinence (female) (male): Secondary | ICD-10-CM | POA: Diagnosis not present

## 2022-11-06 DIAGNOSIS — C61 Malignant neoplasm of prostate: Secondary | ICD-10-CM | POA: Diagnosis not present

## 2022-11-15 DIAGNOSIS — Z7901 Long term (current) use of anticoagulants: Secondary | ICD-10-CM | POA: Diagnosis not present

## 2022-11-15 DIAGNOSIS — I129 Hypertensive chronic kidney disease with stage 1 through stage 4 chronic kidney disease, or unspecified chronic kidney disease: Secondary | ICD-10-CM | POA: Diagnosis not present

## 2022-11-15 DIAGNOSIS — R200 Anesthesia of skin: Secondary | ICD-10-CM | POA: Diagnosis not present

## 2022-11-15 DIAGNOSIS — E7800 Pure hypercholesterolemia, unspecified: Secondary | ICD-10-CM | POA: Diagnosis not present

## 2022-11-15 DIAGNOSIS — E663 Overweight: Secondary | ICD-10-CM | POA: Diagnosis not present

## 2022-11-15 DIAGNOSIS — M5136 Other intervertebral disc degeneration, lumbar region: Secondary | ICD-10-CM | POA: Diagnosis not present

## 2022-11-15 DIAGNOSIS — N1831 Chronic kidney disease, stage 3a: Secondary | ICD-10-CM | POA: Diagnosis not present

## 2022-11-15 DIAGNOSIS — C44311 Basal cell carcinoma of skin of nose: Secondary | ICD-10-CM | POA: Diagnosis not present

## 2022-11-15 DIAGNOSIS — D692 Other nonthrombocytopenic purpura: Secondary | ICD-10-CM | POA: Diagnosis not present

## 2022-11-15 DIAGNOSIS — I480 Paroxysmal atrial fibrillation: Secondary | ICD-10-CM | POA: Diagnosis not present

## 2022-11-15 DIAGNOSIS — D6869 Other thrombophilia: Secondary | ICD-10-CM | POA: Diagnosis not present

## 2022-11-15 DIAGNOSIS — D519 Vitamin B12 deficiency anemia, unspecified: Secondary | ICD-10-CM | POA: Diagnosis not present

## 2022-11-15 DIAGNOSIS — C61 Malignant neoplasm of prostate: Secondary | ICD-10-CM | POA: Diagnosis not present

## 2022-11-26 IMAGING — PT NM PET TUM IMG SKULL BASE T - THIGH
1 series · 1 of 1 positions shown · non-contrast
Comparison: None Available.

CLINICAL DATA: Prostate cancer diagnosed 11/02/2021. PSA equal
3.75. High-grade carcinoma in the RIGHT gland (4+4=8)

EXAM:
NUCLEAR MEDICINE PET SKULL BASE TO THIGH
TECHNIQUE: 8.8 mCi F18 Piflufolastat (Pylarify) was injected intravenously.
Full-ring PET imaging was performed from the skull base to thigh
after the radiotracer. CT data was obtained and used for attenuation
correction and anatomic localization.

[Series 1112: results mm oncology reading · 4.0mm · 1.01mm/px · 1 of 1 slices shown]
[im 1/1]
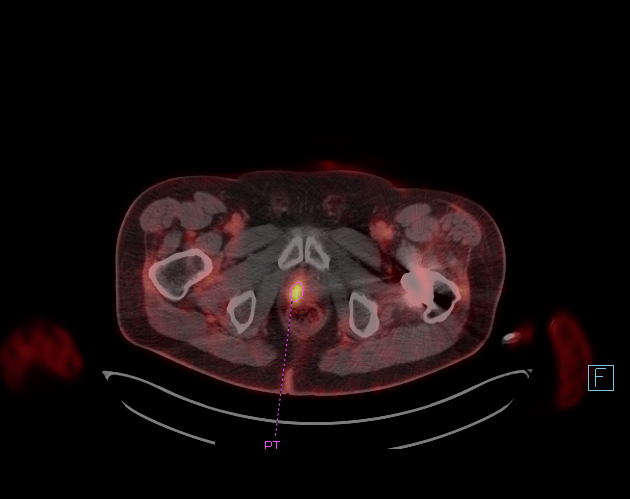

[1 of 1 positions shown; findings below may reference images not displayed]

FINDINGS: NECK

No radiotracer activity in neck lymph nodes.

Incidental CT finding: None

CHEST

No radiotracer accumulation within mediastinal or hilar lymph nodes.
No suspicious pulmonary nodules on the CT scan.

Incidental CT finding: None

ABDOMEN/PELVIS

Prostate: Focal activity in the RIGHT lobe of the prostate gland
with SUV max equal 5.1 (image 213).

Lymph nodes: No abnormal radiotracer accumulation within pelvic or
abdominal nodes.

Liver: No evidence of liver metastasis

Incidental CT finding: None

SKELETON

No focal  activity to suggest skeletal metastasis.
IMPRESSION: 1. Focal activity in the RIGHT lobe of the prostate gland consistent
with high-grade prostate adenocarcinoma.
2. No evidence metastatic adenopathy in the pelvis or periaortic
retroperitoneum.
3. No evidence of visceral metastasis or skeletal metastasis.

## 2022-11-29 ENCOUNTER — Ambulatory Visit: Payer: PPO | Attending: Internal Medicine | Admitting: Internal Medicine

## 2022-11-29 VITALS — BP 138/84 | HR 72 | Ht 69.0 in | Wt 193.2 lb

## 2022-11-29 DIAGNOSIS — E782 Mixed hyperlipidemia: Secondary | ICD-10-CM | POA: Diagnosis not present

## 2022-11-29 DIAGNOSIS — D6859 Other primary thrombophilia: Secondary | ICD-10-CM

## 2022-11-29 DIAGNOSIS — I4821 Permanent atrial fibrillation: Secondary | ICD-10-CM

## 2022-11-29 DIAGNOSIS — I10 Essential (primary) hypertension: Secondary | ICD-10-CM | POA: Diagnosis not present

## 2022-11-29 NOTE — Patient Instructions (Signed)
Medication Instructions:  NO CHANGES  *If you need a refill on your cardiac medications before your next appointment, please call your pharmacy*   Follow-Up: At [facility], you and your health needs are our priority.  As part of our continuing mission to provide you with exceptional heart care, we have created designated Provider Care Teams.  These Care Teams include your primary Cardiologist (physician) and Advanced Practice Providers (APPs -  Physician Assistants and Nurse Practitioners) who all work together to provide you with the care you need, when you need it.  We recommend signing up for the patient portal called "MyChart".  Sign up information is provided on this After Visit Summary.  MyChart is used to connect with patients for Virtual Visits (Telemedicine).  Patients are able to view lab/test results, encounter notes, upcoming appointments, etc.  Non-urgent messages can be sent to your provider as well.   To learn more about what you can do with MyChart, go to [email or web address].    Your next appointment:    12 months

## 2022-11-29 NOTE — Progress Notes (Signed)
OFFICE NOTE  Chief Complaint:  Routine follow-up  Primary Care Physician: Tisovec, Adelfa Koh, MD  HPI:  Steve Weaver is a pleasant 72 year old male who has been experiencing some increasing fatigue with exertion and decreased exercise tolerance over the past several years. 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 He has since struggled, working a number of jobs in order to make ends meet.  He used to be very physically active however recently has stopped exercising and has had about 20 pounds of weight gain. He found that he's been more fatigued during certain activities for example planting some bushes in the yard caused him more shortness of breath and fatigue that he was typically use to. He was recently seen for a well check by his primary care provider, including followup on his hypertension and dyslipidemia. He was noted to have an irregular heartbeat an EKG performed apparently showed premature ventricular contractions. I subsequently received the EKG which shows premature atrial contractions. An EKG performed in our office today also shows PACs, for which she is not completely aware of and it is unclear whether this is new or old.  He denies any specific angina.  Steve Weaver underwent an echocardiogram and nuclear stress test in our office at the end of October and early November. The echocardiogram demonstrated normal systolic function with mild aortic insufficiency and mild mitral insufficiency. There was borderline right atrial enlargement.  He was reportedly very nervous about a stress test and at the onset was noted to be in a rapid heart rate of 150 which was regular and there were clear flutter waves. This terminated spontaneously. Perfusion imaging did not demonstrate any defects.  Steve Weaver stopped his lisinopril due to what he felt was low blood pressures. He brought in a list of his blood pressures today  and 8 do seem to be pretty well controlled at home. He reports some of his symptoms have improved. He denies any chest pain or significant recurrent palpitations.  I saw Steve Weaver back today in the office. He reports no complaints. He denies any chest pain, shortness of breath or worsening fatigue. Blood pressure was mildly elevated today 130/92. EKG shows atrial flutter with variable ventricular response at 119. He is completely unaware of this. He does have a history of paroxysmal atrial flutter in the past but it seems to be remotely. He is now 66 inches CHADSVASC score is 2. He is currently on aspirin only.  Steve Weaver returns today for follow-up of atrial fibrillation. Unfortunately his cardioversion was not successful. We did have him undergo nuclear stress testing which showed no evidence of ischemia although the study was gated while he was in A. fib and his EF was reduced. I suspect this is a gating abnormality. We confirmed normal LV function by echo, however did show mild AI and MR. These findings are considered insignificant when contemplating using a 1C antiarrhythmic. We discussed options and I feel that our best chance of success in achieving sinus rhythm would be with antiarrhythmic therapy. I would recommend he start on flecainide 50 mg twice a day. He should continue Eliquis. He will need an EKG in 1 week after starting flecainide.  10/16/2015  Steve Weaver returns today for follow-up. He underwent cardioversion for atrial fibrillation. He says that afterwards he felt significantly better and continues to feel more energized. However today I told him that he is back in atrial flutter.  This tells me that he may not be truly symptomatic with A. Fib/flutter. Obviously the flecainide was not sufficient enough to hold him and rhythm. We discussed options today including continuing to pursue rhythm control or perhaps rate control if he is truly asymptomatic. We could also consider referral for  ablation, although he since he's had A. fib and atrial flutter, while he may be able to be treated with regards to flutter, he may continue to have atrial fibrillation.  12/25/2015  Steve Weaver was seen today in follow-up. I maintain him on an increased dose of metoprolol which seems to have controlled his ventricular response. His heart rate is irregular today probably indicating persistent atrial fibrillation. He is on Eliquis without any bleeding problems. He continues to be asymptomatic therefore we will continue a rate control strategy. His recent sleep study came back abnormal suggestive of sleep apnea. He says this is probably related to upper airway problems as he has a history of nasal fracture and college which was significant and he has a deviated nasal septum. This is never been evaluated by an ENT doctor.  07/01/2016  Steve Weaver was seen today in follow-up. Recently I cleared him for ENT surgery however he is not yet undergone that. Blood pressure was elevated today 142/96 have her recheck was 138/80. He is overdue for a lipid profile and metabolic profile. He is in atrial fibrillation today this may be persistent and we have not pursued any further attempts for achieving sinus rhythm since he is completely asymptomatic. Heart rate is well-controlled. He is tolerating Eliquis without any bleeding problems.  12/30/2016  Steve Weaver returns today for follow-up. He is doing really well. He just underwent nasal septoplasty. He says he is doing extremely well since then with marked improvement in his breathing and better oxygenation. His wife reports that he does not snore at night anymore. He remains in A. fib which is long-standing persistent at this point but he is asymptomatic with it. He is on Eliquis for CHADSVASC score 2. Blood pressure was well-controlled today. His cholesterol was at goal. He denies any chest pain or worsening shortness of breath.  01/27/2018  Steve Weaver returns today for  follow-up.  He is concerned about worsening dyspnea and fatigue over the past year.  He says he has become more short of breath and gained about 40 pounds, which he attributes to taking beta-blocker and/or the diltiazem since they are listed side effects.  He also reports more fatigue.  He wonders if this could be related to his A. fib.  In the past he is not noted any symptoms related to A. fib, which makes me think it may be his weight gain or other symptoms such as heart disease or heart failure that could be causing his symptoms.  Nevertheless he wants to try to come off the medications and inquired about other attempts to get him back into rhythm.  He had twice failed cardioversions in the past and therefore we had pursued rate control and anticoagulation for the past 4 years.  04/29/2018  Steve Weaver returns today for follow-up of his studies.  He underwent an echo and stress test for progressive dyspnea and fatigue.  This was performed in late August.  His stress test was low risk and demonstrated no ischemia.  His echo showed a low normal EF of 50 to 55% with moderate aortic insufficiency.  Is not clear whether this is contributing to his shortness of breath although he says his  symptoms have improved.  He did recently lose about 10 pounds.  This was due to being sick and he had some type of laryngeal pharyngitis which made it difficult for him to eat.  But pressure remains above goal.  With regards to A. fib he did see Dr. Johney Frame recently.  He did not feel that Steve Weaver was a candidate for ablation rather he felt that we could consider dofetilide if he was really symptomatic.  At this point Loleta Books is not quite sure if his symptoms are related to A. fib or not and would prefer to avoid additional medication.  His A. fib is rate controlled.  08/04/2018  Steve Weaver seen today in follow-up.  He has been placed on chlorthalidone.  He is blood pressure is now much better per improved.  Is now between 125 130  systolic over the low 80s diastolic.  He initially had some cramping on the medication however blood work shows normal potassium normal magnesium.  03/25/2019  Steve Weaver is seen today in follow-up.  He continues to do well.  He denies any chest pain or worsening shortness of breath.  Blood pressure at home is generally been well controlled.  His echo last year showed some moderate AI.  This will need to be repeated again in the future.  He remains in A. fib today which is rate controlled.  He denies any adverse bleeding on Eliquis.  04/13/2020  Steve Weaver is seen today for annual follow-up.  Overall he says he is doing fairly well.  His main issue seems to be with low back pain.  He has a history of exacerbations however more recently has had more consistent back pain and has been describing discomfort in the left groin area.  He thought he had pulled a muscle there and had imaging but it has not improved.  I wonder if this could be a referred pain.  Blood pressures well controlled today.  EKG shows atrial fibrillation which is persistent but rate controlled.  He denies any bleeding issues on Eliquis.  Lipids in September 2020 showed total cholesterol 193, HDL 49, LDL 108 and triglycerides 161.  11/29/2022  Steve Weaver is seen today in follow-up.  He seems to be doing better after recently undergoing radical prostatectomy.  He was found to have prostate cancer.  He has had a rising PSA.  He is also had hip surgery after persistent complaints of low back pain and groin pain and was found to have need for hip replacement.  Since then he has been doing pretty well.  He is in A-fib which was confirmed today on EKG.  He is compliant with his Eliquis.  He said he had protracted urinary bleeding after his surgery but eventually they found polyp or area that was bleeding and were able to cauterize it.  PMHx:  Past Medical History:  Diagnosis Date   Arthritis    Cancer [facility])    prostate   Dysrhythmia    afib    History of kidney stones    Hyperlipidemia    Hypertension    Longstanding persistent atrial fibrillation [facility])     Past Surgical History:  Procedure Laterality Date   BROKE LEG  11/1998   CARDIOVERSION N/A 06/08/2015   Procedure: CARDIOVERSION;  Surgeon: Chrystie Nose, MD;  Location: [facility] ENDOSCOPY;  Service: Cardiovascular;  Laterality: N/A;   CARDIOVERSION N/A 09/11/2015   Procedure: CARDIOVERSION;  Surgeon: Chrystie Nose, MD;  Location: [facility] ENDOSCOPY;  Service:  Cardiovascular;  Laterality: N/A;   CYSTOSCOPY N/A 09/05/2022   Procedure: CYSTOSCOPY;  Surgeon: Heloise Purpura, MD;  Location: [facility] ORS;  Service: Urology;  Laterality: N/A;  60 MINUTES NEEDED FOR CASE   NASAL SEPTOPLASTY W/ TURBINOPLASTY Bilateral 12/16/2016   Procedure: NASAL SEPTOPLASTY WITH BILATERAL TURBINATE REDUCTION;  Surgeon: Newman Pies, MD;  Location: [facility];  Service: ENT;  Laterality: Bilateral;   NM MYOCAR PERF WALL MOTION  05/04/2013   bruce myoview - normal stress nuclear study; a-flutter & a-fib with RVR, EF 59%   ROBOT ASSISTED LAPAROSCOPIC RADICAL PROSTATECTOMY N/A 01/24/2022   Procedure: XI ROBOTIC ASSISTED LAPAROSCOPIC RADICAL PROSTATECTOMY LEVEL 2;  Surgeon: Heloise Purpura, MD;  Location: [facility]S;  Service: Urology;  Laterality: N/A;   TOTAL HIP ARTHROPLASTY Left 06/14/2020   Procedure: TOTAL HIP ARTHROPLASTY ANTERIOR APPROACH;  Surgeon: Samson Frederic, MD;  Location: [facility];  Service: Orthopedics;  Laterality: Left;   TRANSURETHRAL RESECTION OF BLADDER TUMOR N/A 09/05/2022   Procedure: TRANSURETHRAL RESECTION OF BLADDER TUMOR (TURBT);  Surgeon: Heloise Purpura, MD;  Location: [facility];  Service: Urology;  Laterality: N/A;    FAMHx:  Family History  Problem Relation Age of Onset   Hyperlipidemia Mother    Hypertension Mother    Emphysema Mother    Diabetes Father    Dementia Father    Anemia Neg Hx    Arrhythmia Neg Hx    Asthma Neg Hx    Clotting disorder Neg Hx    Fainting Neg Hx     Heart attack Neg Hx    Heart disease Neg Hx    Heart failure Neg Hx     SOCHx:   reports that he has quit smoking. His smoking use included cigars. He has never used smokeless tobacco. He reports that he does not currently use alcohol. He reports that he does not use drugs.  ALLERGIES:  Allergies  Allergen Reactions   Other Rash    Muscle Relaxer (unsure of name)    ROS: Pertinent items noted in HPI and remainder of comprehensive ROS otherwise negative.  HOME MEDS: Current Outpatient Medications  Medication Sig Dispense Refill   apixaban (ELIQUIS) 5 MG TABS tablet TAKE 1 TABLET BY MOUTH TWICE A DAY 180 tablet 1   chlorthalidone (HYGROTON) 25 MG tablet TAKE 1/2 TABLET BY MOUTH EVERY DAY 45 tablet 4   Cholecalciferol (VITAMIN D3) 50 MCG (2000 UT) TABS Take 2,000 Units by mouth daily.     Coenzyme Q10 300 MG CAPS Take 300 mg by mouth daily.     diltiazem (CARDIZEM CD) 120 MG 24 hr capsule TAKE 1 CAPSULE BY MOUTH EVERY DAY. 90 capsule 3   lovastatin (MEVACOR) 40 MG tablet TAKE 1 TABLET BY MOUTH EVERY DAY 90 tablet 2   Magnesium 300 MG CAPS Take 300 mg by mouth daily. Magnesium Complex     metoprolol tartrate (LOPRESSOR) 25 MG tablet TAKE 1 & 1/2 TABLETS BY MOUTH TWICE A DAY 270 tablet 1   Omega-3 Fatty Acids (FISH OIL) 1000 MG CPDR Take 2,000 mg by mouth daily.     phenazopyridine (PYRIDIUM) 200 MG tablet Take 1 tablet (200 mg total) by mouth 3 (three) times daily as needed for pain. 15 tablet 0   Turmeric (QC TUMERIC COMPLEX PO) Take by mouth.     Zinc 50 MG TABS Take 50 mg by mouth daily.     No current facility-administered medications for this visit.    LABS/IMAGING: No results found for this or  any previous visit (from the past 48 hour(s)). No results found.  VITALS: There were no vitals taken for this visit.  EXAM: General appearance: alert and no distress Neck: no carotid bruit, no JVD and thyroid not enlarged, symmetric, no tenderness/mass/nodules Lungs: clear to  auscultation bilaterally Heart: regular rate and rhythm, S1, S2 normal and diastolic murmur: early diastolic 2/6, blowing at apex Abdomen: soft, non-tender; bowel sounds normal; no masses,  no organomegaly Extremities: extremities normal, atraumatic, no cyanosis or edema Pulses: 2+ and symmetric Skin: Skin color, texture, turgor normal. No rashes or lesions Neurologic: Grossly normal Psych: Pleasant  EKG: EKG Interpretation  Date/Time:  Friday November 29 2022 09:32:38 EDT Ventricular Rate:  72 PR Interval:    QRS Duration: 76 QT Interval:  410 QTC Calculation: 448 R Axis:   77 Text Interpretation: Atrial fibrillation When compared with ECG of 11-Sep-2015 10:44, Atrial fibrillation has replaced Sinus rhythm Non-specific change in ST segment in Inferior leads Confirmed by Zoila Shutter [phone]) on 11/29/2022 9:37:50 AM   ASSESSMENT: Persistent atrial fibrillation / flutter, with rapid ventricular response - CHADS2VASC score of 2 Hypertension Dyslipidemia OSA-snoring improved after septal surgery History of nasal septal deviation - s/p septoplasty (12/2016) Moderate aortic insufficiency Low back pain/left groin pain - s/p hip replacement S/p prostatectomy for prostate cancer  PLAN: 1.   Steve Weaver has been doing well after surgery and was found to have prostate cancer.  Hopefully this is the issue for him.  He also had hip surgery.  He is have persistent A-fib which is rate controlled.  He seems to be doing well on Eliquis.  His GU bleeding has subsided.  Blood pressure was minimally elevated today but generally well-controlled.  No changes to his medications.  Plan follow-up with me annually or sooner as necessary.  Chrystie Nose, MD, [facility], FACP  [place]  [facility]  Medical Director of the Advanced Lipid Disorders &  Cardiovascular Risk Reduction Clinic Diplomate of the American Board of Clinical Lipidology Attending Cardiologist  Direct Dial: [phone]  Fax:  [phone]  Website:  [email or web address] 11/29/2022, 9:32 AM

## 2023-01-04 ENCOUNTER — Other Ambulatory Visit: Payer: Self-pay | Admitting: Internal Medicine

## 2023-01-04 DIAGNOSIS — I4892 Unspecified atrial flutter: Secondary | ICD-10-CM

## 2023-01-17 DIAGNOSIS — H25043 Posterior subcapsular polar age-related cataract, bilateral: Secondary | ICD-10-CM | POA: Diagnosis not present

## 2023-01-17 DIAGNOSIS — H2511 Age-related nuclear cataract, right eye: Secondary | ICD-10-CM | POA: Diagnosis not present

## 2023-01-17 DIAGNOSIS — H18413 Arcus senilis, bilateral: Secondary | ICD-10-CM | POA: Diagnosis not present

## 2023-01-17 DIAGNOSIS — H25013 Cortical age-related cataract, bilateral: Secondary | ICD-10-CM | POA: Diagnosis not present

## 2023-01-17 DIAGNOSIS — H2513 Age-related nuclear cataract, bilateral: Secondary | ICD-10-CM | POA: Diagnosis not present

## 2023-01-17 DIAGNOSIS — H35373 Puckering of macula, bilateral: Secondary | ICD-10-CM | POA: Diagnosis not present

## 2023-02-05 DIAGNOSIS — H35373 Puckering of macula, bilateral: Secondary | ICD-10-CM | POA: Diagnosis not present

## 2023-02-05 DIAGNOSIS — H2513 Age-related nuclear cataract, bilateral: Secondary | ICD-10-CM | POA: Diagnosis not present

## 2023-02-05 DIAGNOSIS — H43813 Vitreous degeneration, bilateral: Secondary | ICD-10-CM | POA: Diagnosis not present

## 2023-03-18 ENCOUNTER — Telehealth: Payer: Self-pay | Admitting: *Deleted

## 2023-03-18 NOTE — Telephone Encounter (Signed)
Name: Steve Weaver  DOB: [birth year]  MRN: [id number]  Primary Cardiologist: Chrystie Nose, MD   Preoperative team, please contact this patient and set up a phone call appointment for further preoperative risk assessment. Please obtain consent and complete medication review. Thank you for your help. Last seen by Dr. Rennis Golden on 11/29/2022  I confirm that guidance regarding antiplatelet and oral anticoagulation therapy has been completed and, if necessary, noted below.  Per Pharmacy Longer hold request however this is ok given pt's relatively low CV risk. Ok for pt to hold Eliquis for 3 days prior to procedure as requested.    I also confirmed the patient resides in the state of [place]. As per [facility] Medical Board telemedicine laws, the patient must reside in the state in which the provider is licensed.   Joni Reining, NP 03/18/2023, 1:34 PM [facility]

## 2023-03-18 NOTE — Telephone Encounter (Signed)
Patient with diagnosis of afib on Eliquis for anticoagulation.    Procedure: RETINAL SURGERY / VITRECTOMY/ MEMBRANE PEEL/ INTRAVITREAL INJECTION OF TRIESENCE   Date of procedure: 03/31/23  CHA2DS2-VASc Score = 2  This indicates a 2.2% annual risk of stroke. The patient's score is based upon: CHF History: 0 HTN History: 1 Diabetes History: 0 Stroke History: 0 Vascular Disease History: 0 Age Score: 1 Gender Score: 0   CrCl 27mL/min Platelet count 261K  Longer hold request however this is ok given pt's relatively low CV risk. Ok for pt to hold Eliquis for 3 days prior to procedure as requested.    **This guidance is not considered finalized until pre-operative APP has relayed final recommendations.**

## 2023-03-18 NOTE — Telephone Encounter (Signed)
Pharmacy please advise on holding Eliquis prior to  RETINAL SURGERY / VITRECTOMY/ MEMBRANE PEEL/ INTRAVITREAL INJECTION OF TRIESENCE  scheduled for 03/31/2023. Thank you.

## 2023-03-18 NOTE — Telephone Encounter (Signed)
Lvm for patient to call back for appointment

## 2023-03-18 NOTE — Telephone Encounter (Signed)
Pre-operative Risk Assessment    Patient Name: Vue Beyler  DOB: [birth year] MRN: [id number]      Request for Surgical Clearance    Procedure:   RETINAL SURGERY / VITRECTOMY/ MEMBRANE PEEL/ INTRAVITREAL INJECTION OF TRIESENCE  Date of Surgery:  Clearance 03/31/23                                 Surgeon:  DR. Donnel Saxon Surgeon's Group or Practice Name:  [facility] Phone number:  [phone] Fax number:  [phone]   Type of Clearance Requested:   - Medical  - Pharmacy:  Hold Apixaban (Eliquis) X'S 3 DAYS   Type of Anesthesia:  MAC   Additional requests/questions:    Wilhemina Cash   03/18/2023, 9:24 AM

## 2023-03-19 NOTE — Telephone Encounter (Signed)
Patient returned call to setup appt for clearance. Appt was scheduled for 10/18 at 8:50 with Joni Reining, NP.

## 2023-03-20 NOTE — Telephone Encounter (Signed)
Looks like the pt has opted for IN OFFICE APPT FOR PRE OP CLEARANCE. Pt has been scheduled to see Joni Reining, DNP. I will update all parties involved.

## 2023-03-27 NOTE — Progress Notes (Signed)
Cardiology Clinic Note   Patient Name: Steve Weaver Date of Encounter: 03/28/2023  Primary Care Provider:  Gaspar Garbe, MD Primary Cardiologist:  Steve Nose, MD  Patient Profile    72 year old persistent atrial fibrillation/flutter, CHA2DS2-VASc score of 2, hypertension, hyperlipidemia, OSA status post septal surgery, moderate aortic insufficiency, status post prostatectomy for prostate cancer, chronic low back pain status post hip replacement.  Last seen by Dr. Rennis Weaver on 11/29/2022.  Past Medical History    Past Medical History:  Diagnosis Date   Arthritis    Cancer [facility])    prostate   Dysrhythmia    afib   History of kidney stones    Hyperlipidemia    Hypertension    Longstanding persistent atrial fibrillation [facility])    Past Surgical History:  Procedure Laterality Date   BROKE LEG  11/1998   CARDIOVERSION N/A 06/08/2015   Procedure: CARDIOVERSION;  Surgeon: Steve Nose, MD;  Location: [facility] ENDOSCOPY;  Service: Cardiovascular;  Laterality: N/A;   CARDIOVERSION N/A 09/11/2015   Procedure: CARDIOVERSION;  Surgeon: Steve Nose, MD;  Location: [facility] ENDOSCOPY;  Service: Cardiovascular;  Laterality: N/A;   CYSTOSCOPY N/A 09/05/2022   Procedure: Steve Weaver;  Surgeon: Steve Purpura, MD;  Location: [facility] ORS;  Service: Urology;  Laterality: N/A;  60 MINUTES NEEDED FOR CASE   NASAL SEPTOPLASTY W/ TURBINOPLASTY Bilateral 12/16/2016   Procedure: NASAL SEPTOPLASTY WITH BILATERAL TURBINATE REDUCTION;  Surgeon: Steve Pies, MD;  Location: [facility];  Service: ENT;  Laterality: Bilateral;   NM MYOCAR PERF WALL MOTION  05/04/2013   bruce myoview - normal stress nuclear study; a-flutter & a-fib with RVR, EF 59%   ROBOT ASSISTED LAPAROSCOPIC RADICAL PROSTATECTOMY N/A 01/24/2022   Procedure: XI ROBOTIC ASSISTED LAPAROSCOPIC RADICAL PROSTATECTOMY LEVEL 2;  Surgeon: Steve Purpura, MD;  Location: [facility];  Service: Urology;  Laterality: N/A;   TOTAL HIP ARTHROPLASTY Left  06/14/2020   Procedure: TOTAL HIP ARTHROPLASTY ANTERIOR APPROACH;  Surgeon: Steve Frederic, MD;  Location: [facility];  Service: Orthopedics;  Laterality: Left;   TRANSURETHRAL RESECTION OF BLADDER TUMOR N/A 09/05/2022   Procedure: TRANSURETHRAL RESECTION OF BLADDER TUMOR (TURBT);  Surgeon: Steve Purpura, MD;  Location: [facility];  Service: Urology;  Laterality: N/A;    Allergies  Allergies  Allergen Reactions   Other Rash    Muscle Relaxer (unsure of name)    History of Present Illness    [name] returns to the office today for ongoing assessment and management of atrial fibrillation/flutter, on Eliquis, and for preoperative cardiac evaluation, for RETINAL SURGERY / VITRECTOMY/ MEMBRANE PEEL/ INTRAVITREAL INJECTION OF TRIESENCE by Dr. Stephannie Weaver, scheduled for 03/31/2023, has been evaluated by pharmacy on 03/18/2023 and has been given clearance to hold Eliquis for 3 days prior to procedure.  He denies any chest pain, he is cardiac unaware concerning atrial fibrillation, no bleeding, dyspnea on exertion or extreme fatigue.  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  He is medically compliant.  Home Medications    Current Outpatient Medications  Medication Sig Dispense Refill   chlorthalidone (HYGROTON) 25 MG tablet TAKE 1/2 TABLET BY MOUTH EVERY DAY 45 tablet 4   Cholecalciferol (VITAMIN D3) 50 MCG (2000 UT) TABS Take 2,000 Units by mouth daily.     Coenzyme Q10 300 MG CAPS Take 300 mg by mouth daily.     diltiazem (CARDIZEM  CD) 120 MG 24 hr capsule TAKE 1 CAPSULE BY MOUTH EVERY DAY. 90 capsule 3   lovastatin (MEVACOR) 40 MG tablet TAKE 1 TABLET BY MOUTH EVERY DAY 90 tablet 2   Magnesium 300 MG CAPS Take 300 mg by mouth daily. Magnesium Complex     metoprolol tartrate (LOPRESSOR) 25 MG tablet TAKE 1 & 1/2 TABLETS BY MOUTH TWICE A DAY 270 tablet 1   Omega-3 Fatty Acids (FISH OIL) 1000 MG CPDR Take 2,000 mg by mouth  daily.     phenazopyridine (PYRIDIUM) 200 MG tablet Take 1 tablet (200 mg total) by mouth 3 (three) times daily as needed for pain. 15 tablet 0   Turmeric (QC TUMERIC COMPLEX PO) Take by mouth.     Zinc 50 MG TABS Take 50 mg by mouth daily.     apixaban (ELIQUIS) 5 MG TABS tablet Take 1 tablet (5 mg total) by mouth 2 (two) times daily. 56 tablet 0   No current facility-administered medications for this visit.     Family History    Family History  Problem Relation Age of Onset   Hyperlipidemia Mother    Hypertension Mother    Emphysema Mother    Diabetes Father    Dementia Father    Anemia Neg Hx    Arrhythmia Neg Hx    Asthma Neg Hx    Clotting disorder Neg Hx    Fainting Neg Hx    Heart attack Neg Hx    Heart disease Neg Hx    Heart failure Neg Hx    He indicated that his mother is deceased. He indicated that his father is deceased. He indicated that his sister is alive. He indicated that his maternal grandmother is deceased. He indicated that his maternal grandfather is deceased. He indicated that his paternal grandmother is deceased. He indicated that his paternal grandfather is deceased. He indicated that his daughter is alive. He indicated that his son is alive. He indicated that the status of his neg hx is unknown.  Social History    Social History   Socioeconomic History   Marital status: Married    Spouse name: Not on file   Number of children: 2   Years of education: Not on file   Highest education level: Not on file  Occupational History   Occupation: builder  Tobacco Use   Smoking status: Former    Types: Cigars   Smokeless tobacco: Never   Tobacco comments:    1-2 cigars/month   quit 2018  Vaping Use   Vaping status: Never Used  Substance and Sexual Activity   Alcohol use: Not Currently    Comment: occasional   Drug use: No   Sexual activity: Not Currently    Birth control/protection: None  Other Topics Concern   Not on file  Social History  Narrative   Not on file   Social Determinants of Health   Financial Resource Strain: Not on file  Food Insecurity: Not on file  Transportation Needs: Not on file  Physical Activity: Not on file  Stress: Not on file  Social Connections: Not on file  Intimate Partner Violence: Not on file     Review of Systems    General:  No chills, fever, night sweats or weight changes.  Cardiovascular:  No chest pain, dyspnea on exertion, edema, orthopnea, palpitations, paroxysmal nocturnal dyspnea. Dermatological: No rash, lesions/masses Respiratory: No cough, dyspnea Urologic: No hematuria, dysuria Abdominal:   No nausea, vomiting, diarrhea, bright red blood  per rectum, melena, or hematemesis Neurologic:  No visual changes, wkns, changes in mental status. All other systems reviewed and are otherwise negative except as noted above.  EKG Interpretation Date/Time:  Friday March 28 2023 09:00:23 EDT Ventricular Rate:  101 PR Interval:    QRS Duration:  86 QT Interval:  370 QTC Calculation: 479 R Axis:   66  Text Interpretation: Atrial fibrillation with rapid ventricular response ST & T wave abnormality, consider lateral ischemia When compared with ECG of 29-Nov-2022 09:32, ST now depressed in Anterolateral leads Confirmed by Joni Reining [phone]) on 03/28/2023 9:47:46 AM    Physical Exam    VS:  BP (!) 140/76   Pulse (!) 101   Ht 5\' 9"  (1.753 m)   Wt 197 lb 3.2 oz (89.4 kg)   SpO2 99%   BMI 29.12 kg/m  , BMI Body mass index is 29.12 kg/m.     GEN: Well nourished, well developed, in no acute distress. HEENT: normal. Neck: Supple, no JVD, carotid bruits, or masses. Cardiac: IRRR, no murmurs, rubs, or gallops. No clubbing, cyanosis, edema.  Radials/DP/PT 2+ and equal bilaterally.  Respiratory:  Respirations regular and unlabored, clear to auscultation bilaterally. GI: Soft, nontender, nondistended, BS + x 4. MS: no deformity or atrophy. Skin: warm and dry, no rash. Neuro:   Strength and sensation are intact. Psych: Normal affect.  EKG Interpretation Date/Time:  Friday March 28 2023 09:00:23 EDT Ventricular Rate:  101 PR Interval:    QRS Duration:  86 QT Interval:  370 QTC Calculation: 479 R Axis:   66  Text Interpretation: Atrial fibrillation with rapid ventricular response ST & T wave abnormality, consider lateral ischemia When compared with ECG of 29-Nov-2022 09:32, ST now depressed in Anterolateral leads Confirmed by Joni Reining [phone]) on 03/28/2023 9:47:46 AM   Lab Results  Component Value Date   WBC 6.7 01/14/2022   HGB 16.3 01/25/2022   HCT 45.6 01/25/2022   MCV 94.7 01/14/2022   PLT 261 01/14/2022   Lab Results  Component Value Date   CREATININE 1.40 (H) 08/21/2022   BUN 22 08/21/2022   NA 141 08/21/2022   K 3.7 08/21/2022   CL 102 08/21/2022   CO2 28 08/21/2022   Lab Results  Component Value Date   ALT 32 09/04/2021   AST 34 09/04/2021   ALKPHOS 114 09/04/2021   BILITOT 0.3 09/04/2021   Lab Results  Component Value Date   CHOL 161 07/05/2016   HDL 44 07/05/2016   LDLCALC 84 07/05/2016   TRIG 164 (H) 07/05/2016   CHOLHDL 3.7 07/05/2016    No results found for: "HGBA1C"   Review of Prior Studies EKG Interpretation Date/Time:  Friday March 28 2023 09:00:23 EDT Ventricular Rate:  101 PR Interval:    QRS Duration:  86 QT Interval:  370 QTC Calculation: 479 R Axis:   66  Text Interpretation: Atrial fibrillation with rapid ventricular response ST & T wave abnormality, consider lateral ischemia When compared with ECG of 29-Nov-2022 09:32, ST now depressed in Anterolateral leads Confirmed by Joni Reining [phone]) on 03/28/2023 9:47:46 AM    Assessment & Plan   1.  Preoperative cardiac evaluation: According to the Revised Cardiac Risk Index (RCRI), his Perioperative Risk of Major Cardiac Event is (%): 0.4  His Functional Capacity in METs is: 8.97 according to the Duke Activity Status Index (DASI).    Ok  for pt to hold Eliquis for 3 days prior to procedure as requested. Resume ASAP after  procedure.        Therefore, based on ACC/AHA guidelines, patient would be at acceptable risk for the planned procedure without further cardiovascular testing. I will route this recommendation to the requesting party via Epic fax function.  2.  Permanent atrial fibrillation: Heart rate is very well-controlled on diltiazem 120 mg daily and metoprolol 37.5 mg twice a day.  Eliquis is refilled, samples provided.  Holding Eliquis as above perioperatively.  3.  Hypercholesterolemia: Remains on lovastatin.  Labs are followed by PCP.  Goal of LDL less than 100.     Signed, Bettey Mare. Liborio Nixon, ANP, AACC   03/28/2023 9:50 AM      Office [phone] Fax [phone]  Notice: This dictation was prepared with Dragon dictation along with smaller phrase technology. Any transcriptional errors that result from this process are unintentional and may not be corrected upon review.

## 2023-03-28 ENCOUNTER — Ambulatory Visit: Payer: PPO | Attending: Adult Health | Admitting: Adult Health

## 2023-03-28 ENCOUNTER — Encounter: Payer: Self-pay | Admitting: Adult Health

## 2023-03-28 VITALS — BP 140/76 | HR 101 | Ht 69.0 in | Wt 197.2 lb

## 2023-03-28 DIAGNOSIS — E7800 Pure hypercholesterolemia, unspecified: Secondary | ICD-10-CM | POA: Diagnosis not present

## 2023-03-28 DIAGNOSIS — I4891 Unspecified atrial fibrillation: Secondary | ICD-10-CM

## 2023-03-28 DIAGNOSIS — Z01818 Encounter for other preprocedural examination: Secondary | ICD-10-CM | POA: Diagnosis not present

## 2023-03-28 MED ORDER — APIXABAN 5 MG PO TABS: 5.0000 mg | ORAL_TABLET | Freq: Two times a day (BID) | ORAL | Status: DC

## 2023-03-28 MED ORDER — APIXABAN 5 MG PO TABS: 5.0000 mg | ORAL_TABLET | Freq: Two times a day (BID) | ORAL | 0 refills | Status: DC

## 2023-03-28 NOTE — Patient Instructions (Addendum)
Medication Instructions:  HOLD YOUR ELIQUIS THREE DAYS PRIOR TO YOUR SURGERY.    Lab Work: NONE    Testing/Procedures: NONE   Follow-Up: At [facility], you and your health needs are our priority.  As part of our continuing mission to provide you with exceptional heart care, we have created designated Provider Care Teams.  These Care Teams include your primary Cardiologist (physician) and Advanced Practice Providers (APPs -  Physician Assistants and Nurse Practitioners) who all work together to provide you with the care you need, when you need it.   Your next appointment:   6 MONTHS   Provider:   Chrystie Nose, MD

## 2023-03-31 DIAGNOSIS — H3581 Retinal edema: Secondary | ICD-10-CM | POA: Diagnosis not present

## 2023-03-31 DIAGNOSIS — H2511 Age-related nuclear cataract, right eye: Secondary | ICD-10-CM | POA: Diagnosis not present

## 2023-03-31 DIAGNOSIS — H35371 Puckering of macula, right eye: Secondary | ICD-10-CM | POA: Diagnosis not present

## 2023-04-01 ENCOUNTER — Other Ambulatory Visit: Payer: Self-pay | Admitting: Internal Medicine

## 2023-04-09 DIAGNOSIS — Z9889 Other specified postprocedural states: Secondary | ICD-10-CM | POA: Diagnosis not present

## 2023-04-09 DIAGNOSIS — H3581 Retinal edema: Secondary | ICD-10-CM | POA: Diagnosis not present

## 2023-04-09 DIAGNOSIS — H2511 Age-related nuclear cataract, right eye: Secondary | ICD-10-CM | POA: Diagnosis not present

## 2023-04-09 DIAGNOSIS — H35371 Puckering of macula, right eye: Secondary | ICD-10-CM | POA: Diagnosis not present

## 2023-04-14 DIAGNOSIS — H2512 Age-related nuclear cataract, left eye: Secondary | ICD-10-CM | POA: Diagnosis not present

## 2023-04-14 DIAGNOSIS — H35372 Puckering of macula, left eye: Secondary | ICD-10-CM | POA: Diagnosis not present

## 2023-04-14 DIAGNOSIS — H3581 Retinal edema: Secondary | ICD-10-CM | POA: Diagnosis not present

## 2023-04-23 DIAGNOSIS — H35373 Puckering of macula, bilateral: Secondary | ICD-10-CM | POA: Diagnosis not present

## 2023-04-23 DIAGNOSIS — Z9889 Other specified postprocedural states: Secondary | ICD-10-CM | POA: Diagnosis not present

## 2023-04-28 ENCOUNTER — Other Ambulatory Visit: Payer: Self-pay | Admitting: Internal Medicine

## 2023-04-28 DIAGNOSIS — I4892 Unspecified atrial flutter: Secondary | ICD-10-CM

## 2023-05-14 ENCOUNTER — Other Ambulatory Visit: Payer: Self-pay | Admitting: Internal Medicine

## 2023-05-14 DIAGNOSIS — Z9889 Other specified postprocedural states: Secondary | ICD-10-CM | POA: Diagnosis not present

## 2023-05-14 DIAGNOSIS — I4891 Unspecified atrial fibrillation: Secondary | ICD-10-CM

## 2023-05-14 DIAGNOSIS — H35373 Puckering of macula, bilateral: Secondary | ICD-10-CM | POA: Diagnosis not present

## 2023-05-14 NOTE — Telephone Encounter (Signed)
Prescription refill request for Eliquis received. Indication: Afib  Last office visit: 03/28/23 Lyman Bishop)  Scr: 1.40 (08/21/22)  Age: 72 Weight: 89.4kg  Appropriate dose. Refill sent.

## 2023-05-21 DIAGNOSIS — C61 Malignant neoplasm of prostate: Secondary | ICD-10-CM | POA: Diagnosis not present

## 2023-05-23 DIAGNOSIS — Z1212 Encounter for screening for malignant neoplasm of rectum: Secondary | ICD-10-CM | POA: Diagnosis not present

## 2023-05-23 DIAGNOSIS — I129 Hypertensive chronic kidney disease with stage 1 through stage 4 chronic kidney disease, or unspecified chronic kidney disease: Secondary | ICD-10-CM | POA: Diagnosis not present

## 2023-05-23 DIAGNOSIS — C61 Malignant neoplasm of prostate: Secondary | ICD-10-CM | POA: Diagnosis not present

## 2023-05-23 DIAGNOSIS — E7800 Pure hypercholesterolemia, unspecified: Secondary | ICD-10-CM | POA: Diagnosis not present

## 2023-05-23 DIAGNOSIS — N1831 Chronic kidney disease, stage 3a: Secondary | ICD-10-CM | POA: Diagnosis not present

## 2023-05-23 DIAGNOSIS — E785 Hyperlipidemia, unspecified: Secondary | ICD-10-CM | POA: Diagnosis not present

## 2023-05-28 DIAGNOSIS — N390 Urinary tract infection, site not specified: Secondary | ICD-10-CM | POA: Diagnosis not present

## 2023-05-28 DIAGNOSIS — C61 Malignant neoplasm of prostate: Secondary | ICD-10-CM | POA: Diagnosis not present

## 2023-05-28 DIAGNOSIS — N393 Stress incontinence (female) (male): Secondary | ICD-10-CM | POA: Diagnosis not present

## 2023-05-30 DIAGNOSIS — Z0000 Encounter for general adult medical examination without abnormal findings: Secondary | ICD-10-CM | POA: Diagnosis not present

## 2023-05-30 DIAGNOSIS — G4733 Obstructive sleep apnea (adult) (pediatric): Secondary | ICD-10-CM | POA: Diagnosis not present

## 2023-05-30 DIAGNOSIS — I480 Paroxysmal atrial fibrillation: Secondary | ICD-10-CM | POA: Diagnosis not present

## 2023-05-30 DIAGNOSIS — E7800 Pure hypercholesterolemia, unspecified: Secondary | ICD-10-CM | POA: Diagnosis not present

## 2023-05-30 DIAGNOSIS — Z7901 Long term (current) use of anticoagulants: Secondary | ICD-10-CM | POA: Diagnosis not present

## 2023-05-30 DIAGNOSIS — C61 Malignant neoplasm of prostate: Secondary | ICD-10-CM | POA: Diagnosis not present

## 2023-05-30 DIAGNOSIS — N1831 Chronic kidney disease, stage 3a: Secondary | ICD-10-CM | POA: Diagnosis not present

## 2023-05-30 DIAGNOSIS — D6869 Other thrombophilia: Secondary | ICD-10-CM | POA: Diagnosis not present

## 2023-05-30 DIAGNOSIS — Z1339 Encounter for screening examination for other mental health and behavioral disorders: Secondary | ICD-10-CM | POA: Diagnosis not present

## 2023-05-30 DIAGNOSIS — R200 Anesthesia of skin: Secondary | ICD-10-CM | POA: Diagnosis not present

## 2023-05-30 DIAGNOSIS — D692 Other nonthrombocytopenic purpura: Secondary | ICD-10-CM | POA: Diagnosis not present

## 2023-05-30 DIAGNOSIS — R82998 Other abnormal findings in urine: Secondary | ICD-10-CM | POA: Diagnosis not present

## 2023-05-30 DIAGNOSIS — C44311 Basal cell carcinoma of skin of nose: Secondary | ICD-10-CM | POA: Diagnosis not present

## 2023-05-30 DIAGNOSIS — I129 Hypertensive chronic kidney disease with stage 1 through stage 4 chronic kidney disease, or unspecified chronic kidney disease: Secondary | ICD-10-CM | POA: Diagnosis not present

## 2023-05-30 DIAGNOSIS — Z1331 Encounter for screening for depression: Secondary | ICD-10-CM | POA: Diagnosis not present

## 2023-05-30 DIAGNOSIS — E663 Overweight: Secondary | ICD-10-CM | POA: Diagnosis not present

## 2023-09-25 ENCOUNTER — Other Ambulatory Visit: Payer: Self-pay | Admitting: Internal Medicine

## 2023-11-18 ENCOUNTER — Other Ambulatory Visit: Payer: Self-pay | Admitting: Internal Medicine

## 2023-11-18 DIAGNOSIS — I4891 Unspecified atrial fibrillation: Secondary | ICD-10-CM

## 2023-11-18 NOTE — Telephone Encounter (Signed)
 Prescription refill request for Eliquis  received. Indication:afib Last office visit:10/24 Scr: Age:  Weight:

## 2023-11-19 DIAGNOSIS — D6869 Other thrombophilia: Secondary | ICD-10-CM | POA: Diagnosis not present

## 2023-11-19 DIAGNOSIS — E663 Overweight: Secondary | ICD-10-CM | POA: Diagnosis not present

## 2023-11-19 DIAGNOSIS — D692 Other nonthrombocytopenic purpura: Secondary | ICD-10-CM | POA: Diagnosis not present

## 2023-11-19 DIAGNOSIS — Z7901 Long term (current) use of anticoagulants: Secondary | ICD-10-CM | POA: Diagnosis not present

## 2023-11-19 DIAGNOSIS — N183 Chronic kidney disease, stage 3 unspecified: Secondary | ICD-10-CM | POA: Diagnosis not present

## 2023-11-19 DIAGNOSIS — N1831 Chronic kidney disease, stage 3a: Secondary | ICD-10-CM | POA: Diagnosis not present

## 2023-11-19 DIAGNOSIS — D126 Benign neoplasm of colon, unspecified: Secondary | ICD-10-CM | POA: Diagnosis not present

## 2023-11-19 DIAGNOSIS — G4733 Obstructive sleep apnea (adult) (pediatric): Secondary | ICD-10-CM | POA: Diagnosis not present

## 2023-11-19 DIAGNOSIS — I480 Paroxysmal atrial fibrillation: Secondary | ICD-10-CM | POA: Diagnosis not present

## 2023-11-19 DIAGNOSIS — C44311 Basal cell carcinoma of skin of nose: Secondary | ICD-10-CM | POA: Diagnosis not present

## 2023-11-19 DIAGNOSIS — I129 Hypertensive chronic kidney disease with stage 1 through stage 4 chronic kidney disease, or unspecified chronic kidney disease: Secondary | ICD-10-CM | POA: Diagnosis not present

## 2023-11-19 DIAGNOSIS — E7800 Pure hypercholesterolemia, unspecified: Secondary | ICD-10-CM | POA: Diagnosis not present

## 2023-11-19 DIAGNOSIS — C61 Malignant neoplasm of prostate: Secondary | ICD-10-CM | POA: Diagnosis not present

## 2023-11-21 DIAGNOSIS — C61 Malignant neoplasm of prostate: Secondary | ICD-10-CM | POA: Diagnosis not present

## 2023-11-28 DIAGNOSIS — N393 Stress incontinence (female) (male): Secondary | ICD-10-CM | POA: Diagnosis not present

## 2023-11-28 DIAGNOSIS — C61 Malignant neoplasm of prostate: Secondary | ICD-10-CM | POA: Diagnosis not present

## 2023-12-25 ENCOUNTER — Other Ambulatory Visit: Payer: Self-pay | Admitting: Internal Medicine

## 2024-01-02 ENCOUNTER — Ambulatory Visit: Attending: Internal Medicine | Admitting: Internal Medicine

## 2024-01-02 ENCOUNTER — Encounter: Payer: Self-pay | Admitting: Internal Medicine

## 2024-01-02 VITALS — BP 132/86 | HR 67 | Ht 68.0 in | Wt 193.2 lb

## 2024-01-02 DIAGNOSIS — I4821 Permanent atrial fibrillation: Secondary | ICD-10-CM

## 2024-01-02 DIAGNOSIS — E785 Hyperlipidemia, unspecified: Secondary | ICD-10-CM

## 2024-01-02 DIAGNOSIS — I10 Essential (primary) hypertension: Secondary | ICD-10-CM

## 2024-01-02 DIAGNOSIS — I4891 Unspecified atrial fibrillation: Secondary | ICD-10-CM

## 2024-01-02 NOTE — Progress Notes (Signed)
 OFFICE NOTE  Chief Complaint:  Routine follow-up  Primary Care Physician: Tisovec, Charlie ORN, MD  HPI:  Steve Weaver is a pleasant 73 year old male who has been experiencing some increasing fatigue with exertion and decreased exercise tolerance over the past several years. 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 He has since struggled, working a number of jobs in order to make ends meet.  He used to be very physically active however recently has stopped exercising and has had about 20 pounds of weight gain. He found that he's been more fatigued during certain activities for example planting some bushes in the yard caused him more shortness of breath and fatigue that he was typically use to. He was recently seen for a well check by his primary care provider, including followup on his hypertension and dyslipidemia. He was noted to have an irregular heartbeat an EKG performed apparently showed premature ventricular contractions. I subsequently received the EKG which shows premature atrial contractions. An EKG performed in our office today also shows PACs, for which she is not completely aware of and it is unclear whether this is new or old.  He denies any specific angina.  Steve Weaver underwent an echocardiogram and nuclear stress test in our office at the end of October and early November. The echocardiogram demonstrated normal systolic function with mild aortic insufficiency and mild mitral insufficiency. There was borderline right atrial enlargement.  He was reportedly very nervous about a stress test and at the onset was noted to be in a rapid heart rate of 150 which was regular and there were clear flutter waves. This terminated spontaneously. Perfusion imaging did not demonstrate any defects.  Steve Weaver stopped his lisinopril due to what he felt was low blood pressures. He brought in a list of his blood pressures today  and 8 do seem to be pretty well controlled at home. He reports some of his symptoms have improved. He denies any chest pain or significant recurrent palpitations.  I saw Steve Weaver back today in the office. He reports no complaints. He denies any chest pain, shortness of breath or worsening fatigue. Blood pressure was mildly elevated today 130/92. EKG shows atrial flutter with variable ventricular response at 119. He is completely unaware of this. He does have a history of paroxysmal atrial flutter in the past but it seems to be remotely. He is now 66 inches CHADSVASC score is 2. He is currently on aspirin only.  Steve Weaver returns today for follow-up of atrial fibrillation. Unfortunately his cardioversion was not successful. We did have him undergo nuclear stress testing which showed no evidence of ischemia although the study was gated while he was in A. fib and his EF was reduced. I suspect this is a gating abnormality. We confirmed normal LV function by echo, however did show mild AI and MR. These findings are considered insignificant when contemplating using a 1C antiarrhythmic. We discussed options and I feel that our best chance of success in achieving sinus rhythm would be with antiarrhythmic therapy. I would recommend he start on flecainide  50 mg twice a day. He should continue Eliquis . He will need an EKG in 1 week after starting flecainide .  10/16/2015  Steve Weaver returns today for follow-up. He underwent cardioversion for atrial fibrillation. He says that afterwards he felt significantly better and continues to feel more energized. However today I told him that he is back in atrial flutter.  This tells me that he may not be truly symptomatic with A. Fib/flutter. Obviously the flecainide  was not sufficient enough to hold him and rhythm. We discussed options today including continuing to pursue rhythm control or perhaps rate control if he is truly asymptomatic. We could also consider referral for  ablation, although he since he's had A. fib and atrial flutter, while he may be able to be treated with regards to flutter, he may continue to have atrial fibrillation.  12/25/2015  Steve Weaver was seen today in follow-up. I maintain him on an increased dose of metoprolol  which seems to have controlled his ventricular response. His heart rate is irregular today probably indicating persistent atrial fibrillation. He is on Eliquis  without any bleeding problems. He continues to be asymptomatic therefore we will continue a rate control strategy. His recent sleep study came back abnormal suggestive of sleep apnea. He says this is probably related to upper airway problems as he has a history of nasal fracture and college which was significant and he has a deviated nasal septum. This is never been evaluated by an ENT doctor.  07/01/2016  Steve Weaver was seen today in follow-up. Recently I cleared him for ENT surgery however he is not yet undergone that. Blood pressure was elevated today 142/96 have her recheck was 138/80. He is overdue for a lipid profile and metabolic profile. He is in atrial fibrillation today this may be persistent and we have not pursued any further attempts for achieving sinus rhythm since he is completely asymptomatic. Heart rate is well-controlled. He is tolerating Eliquis  without any bleeding problems.  12/30/2016  Steve Weaver returns today for follow-up. He is doing really well. He just underwent nasal septoplasty. He says he is doing extremely well since then with marked improvement in his breathing and better oxygenation. His wife reports that he does not snore at night anymore. He remains in A. fib which is long-standing persistent at this point but he is asymptomatic with it. He is on Eliquis  for CHADSVASC score 2. Blood pressure was well-controlled today. His cholesterol was at goal. He denies any chest pain or worsening shortness of breath.  01/27/2018  Steve Weaver returns today for  follow-up.  He is concerned about worsening dyspnea and fatigue over the past year.  He says he has become more short of breath and gained about 40 pounds, which he attributes to taking beta-blocker and/or the diltiazem  since they are listed side effects.  He also reports more fatigue.  He wonders if this could be related to his A. fib.  In the past he is not noted any symptoms related to A. fib, which makes me think it may be his weight gain or other symptoms such as heart disease or heart failure that could be causing his symptoms.  Nevertheless he wants to try to come off the medications and inquired about other attempts to get him back into rhythm.  He had twice failed cardioversions in the past and therefore we had pursued rate control and anticoagulation for the past 4 years.  04/29/2018  Steve Weaver returns today for follow-up of his studies.  He underwent an echo and stress test for progressive dyspnea and fatigue.  This was performed in late August.  His stress test was low risk and demonstrated no ischemia.  His echo showed a low normal EF of 50 to 55% with moderate aortic insufficiency.  Is not clear whether this is contributing to his shortness of breath although he says his  symptoms have improved.  He did recently lose about 10 pounds.  This was due to being sick and he had some type of laryngeal pharyngitis which made it difficult for him to eat.  But pressure remains above goal.  With regards to A. fib he did see Dr. Kelsie recently.  He did not feel that Steve Weaver was a candidate for ablation rather he felt that we could consider dofetilide if he was really symptomatic.  At this point Shila is not quite sure if his symptoms are related to A. fib or not and would prefer to avoid additional medication.  His A. fib is rate controlled.  08/04/2018  Steve Weaver seen today in follow-up.  He has been placed on chlorthalidone .  He is blood pressure is now much better per improved.  Is now between 125 130  systolic over the low 80s diastolic.  He initially had some cramping on the medication however blood work shows normal potassium normal magnesium.  03/25/2019  Steve Weaver is seen today in follow-up.  He continues to do well.  He denies any chest pain or worsening shortness of breath.  Blood pressure at home is generally been well controlled.  His echo last year showed some moderate AI.  This will need to be repeated again in the future.  He remains in A. fib today which is rate controlled.  He denies any adverse bleeding on Eliquis .  04/13/2020  Steve Weaver is seen today for annual follow-up.  Overall he says he is doing fairly well.  His main issue seems to be with low back pain.  He has a history of exacerbations however more recently has had more consistent back pain and has been describing discomfort in the left groin area.  He thought he had pulled a muscle there and had imaging but it has not improved.  I wonder if this could be a referred pain.  Blood pressures well controlled today.  EKG shows atrial fibrillation which is persistent but rate controlled.  He denies any bleeding issues on Eliquis .  Lipids in September 2020 showed total cholesterol 193, HDL 49, LDL 108 and triglycerides 818.  11/29/2022  Steve Weaver is seen today in follow-up.  He seems to be doing better after recently undergoing radical prostatectomy.  He was found to have prostate cancer.  He has had a rising PSA.  He is also had hip surgery after persistent complaints of low back pain and groin pain and was found to have need for hip replacement.  Since then he has been doing pretty well.  He is in A-fib which was confirmed today on EKG.  He is compliant with his Eliquis .  He said he had protracted urinary bleeding after his surgery but eventually they found polyp or area that was bleeding and were able to cauterize it.  01/02/2024  Steve Weaver returns today for follow-up.  Overall he says he is feeling pretty well.  He denies any  recurrence of cancer.  Overall he denies any chest pain or worsening shortness of breath.  He has had no further adverse bleeding.  He remains on Eliquis .  No awareness of any recurrent A-fib.  He did have lipid testing in December which showed total cholesterol 220, HDL 51, triglycerides 215 and LDL 126.  His goal LDL is less than 70.  Blood pressures decent today 132/86.  PMHx:  Past Medical History:  Diagnosis Date   Arthritis    Cancer [facility])    prostate  Dysrhythmia    afib   History of kidney stones    Hyperlipidemia    Hypertension    Longstanding persistent atrial fibrillation [facility])     Past Surgical History:  Procedure Laterality Date   BROKE LEG  11/1998   CARDIOVERSION N/A 06/08/2015   Procedure: CARDIOVERSION;  Surgeon: Vinie JAYSON Maxcy, MD;  Location: [facility] ENDOSCOPY;  Service: Cardiovascular;  Laterality: N/A;   CARDIOVERSION N/A 09/11/2015   Procedure: CARDIOVERSION;  Surgeon: Vinie JAYSON Maxcy, MD;  Location: [facility] ENDOSCOPY;  Service: Cardiovascular;  Laterality: N/A;   CYSTOSCOPY N/A 09/05/2022   Procedure: PHYLLIS;  Surgeon: Renda Glance, MD;  Location: [facility];  Service: Urology;  Laterality: N/A;  60 MINUTES NEEDED FOR CASE   NASAL SEPTOPLASTY W/ TURBINOPLASTY Bilateral 12/16/2016   Procedure: NASAL SEPTOPLASTY WITH BILATERAL TURBINATE REDUCTION;  Surgeon: Karis Clunes, MD;  Location[facility];  Service: ENT;  Laterality: Bilateral;   NM MYOCAR PERF WALL MOTION  05/04/2013   bruce myoview  - normal stress nuclear study; a-flutter & a-fib with RVR, EF 59%   ROBOT ASSISTED LAPAROSCOPIC RADICAL PROSTATECTOMY N/A 01/24/2022   Procedure: XI ROBOTIC ASSISTED LAPAROSCOPIC RADICAL PROSTATECTOMY LEVEL 2;  Surgeon: Renda Glance, MD;  Location: [facility];  Service: Urology;  Laterality: N/A;   TOTAL HIP ARTHROPLASTY Left 06/14/2020   Procedure: TOTAL HIP ARTHROPLASTY ANTERIOR APPROACH;  Surgeon: Fidel Rogue, MD;  Location: [facility];  Service: Orthopedics;  Laterality: Left;    TRANSURETHRAL RESECTION OF BLADDER TUMOR N/A 09/05/2022   Procedure: TRANSURETHRAL RESECTION OF BLADDER TUMOR (TURBT);  Surgeon: Renda Glance, MD;  Location: [facility];  Service: Urology;  Laterality: N/A;    FAMHx:  Family History  Problem Relation Age of Onset   Hyperlipidemia Mother    Hypertension Mother    Emphysema Mother    Diabetes Father    Dementia Father    Anemia Neg Hx    Arrhythmia Neg Hx    Asthma Neg Hx    Clotting disorder Neg Hx    Fainting Neg Hx    Heart attack Neg Hx    Heart disease Neg Hx    Heart failure Neg Hx     SOCHx:   reports that he has quit smoking. His smoking use included cigars. He has never used smokeless tobacco. He reports that he does not currently use alcohol . He reports that he does not use drugs.  ALLERGIES:  Allergies  Allergen Reactions   Other Rash    Muscle Relaxer (unsure of name)    ROS: Pertinent items noted in HPI and remainder of comprehensive ROS otherwise negative.  HOME MEDS: Current Outpatient Medications  Medication Sig Dispense Refill   chlorthalidone  (HYGROTON ) 25 MG tablet TAKE ONE-HALF TABLET BY MOUTH DAILY 45 tablet 20   Cholecalciferol  (VITAMIN D3) 50 MCG (2000 UT) TABS Take 2,000 Units by mouth daily.     Coenzyme Q10 300 MG CAPS Take 300 mg by mouth daily.     diltiazem  (CARDIZEM  CD) 120 MG 24 hr capsule Take 1 capsule (120 mg total) by mouth daily. 90 capsule 1   ELIQUIS  5 MG TABS tablet TAKE 1 TABLET BY MOUTH TWICE A DAY 180 tablet 1   lovastatin  (MEVACOR ) 40 MG tablet TAKE 1 TABLET BY MOUTH EVERY DAY 90 tablet 3   Magnesium 300 MG CAPS Take 300 mg by mouth daily. Magnesium Complex     metoprolol  tartrate (LOPRESSOR ) 25 MG tablet TAKE 1 & 1/2 TABLETS BY MOUTH TWICE A DAY 270 tablet  3   Omega-3 Fatty Acids (FISH OIL) 1000 MG CPDR Take 2,000 mg by mouth daily.     phenazopyridine  (PYRIDIUM ) 200 MG tablet Take 1 tablet (200 mg total) by mouth 3 (three) times daily as needed for pain. 15 tablet 0   Turmeric  (QC TUMERIC COMPLEX PO) Take by mouth.     Zinc  50 MG TABS Take 50 mg by mouth daily.     No current facility-administered medications for this visit.    LABS/IMAGING: No results found for this or any previous visit (from the past 48 hours). No results found.  VITALS: BP 132/86   Pulse 67   Ht 5' 8 (1.727 m)   Wt 193 lb 3.2 oz (87.6 kg)   SpO2 96%   BMI 29.38 kg/m   EXAM: General appearance: alert and no distress Neck: no carotid bruit, no JVD and thyroid  not enlarged, symmetric, no tenderness/mass/nodules Lungs: clear to auscultation bilaterally Heart: regular rate and rhythm, S1, S2 normal and diastolic murmur: early diastolic 2/6, blowing at apex Abdomen: soft, non-tender; bowel sounds normal; no masses,  no organomegaly Extremities: extremities normal, atraumatic, no cyanosis or edema Pulses: 2+ and symmetric Skin: Skin color, texture, turgor normal. No rashes or lesions Neurologic: Grossly normal Psych: Pleasant  EKG: EKG Interpretation Date/Time:  Friday January 02 2024 10:26:53 EDT Ventricular Rate:  67 PR Interval:    QRS Duration:  88 QT Interval:  394 QTC Calculation: 416 R Axis:   62  Text Interpretation: Atrial fibrillation Nonspecific ST and T wave abnormality When compared with ECG of 28-Mar-2023 09:00, No significant change since last tracing Confirmed by Mona Kent [phone]) on 01/02/2024 10:33:00 AM   ASSESSMENT: Permanent atrial fibrillation - CHADS2VASC score of 2 Hypertension Dyslipidemia, goal LDL less than 70 OSA-snoring improved after septal surgery History of nasal septal deviation - s/p septoplasty (12/2016) Moderate aortic insufficiency Low back pain/left groin pain - s/p hip replacement S/p prostatectomy for prostate cancer  PLAN: 1.   Steve Weaver seems to be doing well without any chest pain or shortness of breath.  He unaware of A-fib but is in rate controlled A-fib today.  I suspect this is longstanding persistent if not permanent.  He  is anticoagulated on Eliquis  without bleeding issues.  His cholesterol is above target he would likely benefit from additional lipid-lowering.  His lipids were tested in December and therefore we will update a fasting lipid profile.  He might be a candidate to add a PCSK9 inhibitor to to get to target.  We discussed that some today.  Plan follow-up otherwise with me annually or sooner as necessary.  Kent KYM Mona, MD, [facility], FNLA, FACP  [facility]  Medical Director of the Advanced Lipid Disorders &  Cardiovascular Risk Reduction Clinic Diplomate of the American Board of Clinical Lipidology Attending Cardiologist  Direct Dial: [phone]  Fax: [phone]  Website:  [email or web address] Kent JAYSON Mona 01/02/2024, 9:23 PM

## 2024-01-02 NOTE — Patient Instructions (Signed)
 Medication Instructions:  Your physician recommends that you continue on your current medications as directed. Please refer to the Current Medication list given to you today.  *If you need a refill on your cardiac medications before your next appointment, please call your pharmacy*  Lab Work: NMR, Lp(a) at your earliest convenience  If you have labs (blood work) drawn today and your tests are completely normal, you will receive your results only by: MyChart Message (if you have MyChart) OR A paper copy in the mail If you have any lab test that is abnormal or we need to change your treatment, we will call you to review the results.  Testing/Procedures: NONE  Follow-Up: At [facility], you and your health needs are our priority.  As part of our continuing mission to provide you with exceptional heart care, our providers are all part of one team.  This team includes your primary Cardiologist (physician) and Advanced Practice Providers or APPs (Physician Assistants and Nurse Practitioners) who all work together to provide you with the care you need, when you need it.  Your next appointment:   1 year(s)  Provider:   Vinie JAYSON Maxcy, MD    We recommend signing up for the patient portal called MyChart.  Sign up information is provided on this After Visit Summary.  MyChart is used to connect with patients for Virtual Visits (Telemedicine).  Patients are able to view lab/test results, encounter notes, upcoming appointments, etc.  Non-urgent messages can be sent to your provider as well.   To learn more about what you can do with MyChart, go to [email or web address].

## 2024-01-07 DIAGNOSIS — E782 Mixed hyperlipidemia: Secondary | ICD-10-CM | POA: Diagnosis not present

## 2024-01-07 DIAGNOSIS — E7800 Pure hypercholesterolemia, unspecified: Secondary | ICD-10-CM | POA: Diagnosis not present

## 2024-01-08 ENCOUNTER — Ambulatory Visit (HOSPITAL_BASED_OUTPATIENT_CLINIC_OR_DEPARTMENT_OTHER): Payer: Self-pay | Admitting: Internal Medicine

## 2024-01-08 DIAGNOSIS — E785 Hyperlipidemia, unspecified: Secondary | ICD-10-CM

## 2024-01-08 LAB — LIPID PANEL
Chol/HDL Ratio: 3.9 ratio (ref 0.0–5.0)
Cholesterol, Total: 205 mg/dL — ABNORMAL HIGH (ref 100–199)
HDL: 53 mg/dL (ref >39)
LDL Chol Calc (NIH): 126 mg/dL — ABNORMAL HIGH (ref 0–99)
Triglycerides: 146 mg/dL (ref 0–149)
VLDL Cholesterol Cal: 26 mg/dL (ref 5–40)

## 2024-01-08 LAB — NMR, LIPOPROFILE
Cholesterol, Total: 213 mg/dL — ABNORMAL HIGH (ref 100–199)
HDL Particle Number: 37.5 umol/L (ref >=30.5)
HDL-C: 54 mg/dL (ref >39)
LDL Particle Number: 1853 nmol/L — ABNORMAL HIGH (ref <1000)
LDL Size: 20.6 nm (ref >20.5)
LDL-C (NIH Calc): 131 mg/dL — ABNORMAL HIGH (ref 0–99)
LP-IR Score: 72 — ABNORMAL HIGH (ref <=45)
Small LDL Particle Number: 905 nmol/L — ABNORMAL HIGH (ref <=527)
Triglycerides: 160 mg/dL — ABNORMAL HIGH (ref 0–149)

## 2024-01-16 ENCOUNTER — Telehealth: Payer: Self-pay | Admitting: Pharmacy Technician

## 2024-01-16 ENCOUNTER — Other Ambulatory Visit (HOSPITAL_COMMUNITY): Payer: Self-pay

## 2024-01-16 NOTE — Telephone Encounter (Signed)
 From staff messages    Pharmacy Patient Advocate Encounter   Received notification from STAFF MESSAGES that prior authorization for REPATHA is required/requested.   Insurance verification completed.   The patient is insured through [facility] ADVANTAGE/RX ADVANCE .   Per test claim: PA required; PA submitted to above mentioned insurance via LATENT Key/confirmation #/EOC [id number] Status is pending   Pharmacy Patient Advocate Encounter  Received notification from [facility] ADVANTAGE/RX ADVANCE that Prior Authorization for REPATHA has been APPROVED from 01/16/24 to 07/14/24. Ran test claim, Copay is $47.00. This test claim was processed through [facility]- copay amounts may vary at other pharmacies due to pharmacy/plan contracts, or as the patient moves through the different stages of their insurance plan.   PA #/Case ID/Reference #: [id number]

## 2024-01-20 NOTE — Telephone Encounter (Signed)
 Left message to wife Dickey with lab results and Repatha prior authorization and copay cost information. Instructed pt wife to call back with pharmacy information and for any questions.   Josie RN

## 2024-01-20 NOTE — Telephone Encounter (Signed)
 Patient returned RN's call regarding Repatha medication.

## 2024-01-23 MED ORDER — LOVASTATIN 40 MG PO TABS: 40.0000 mg | ORAL_TABLET | Freq: Every day | ORAL | 3 refills | Status: AC

## 2024-01-23 NOTE — Telephone Encounter (Signed)
 Called patient to make aware of Repatha approval and copay cost. Before starting Repatha, patient want to make Dr. Mona aware that his diet has been inadequate these past couple of months. States eating processed food at least 3 times a week. He wants to see if diet modification alone will be enough to bring LDL down to target goal. I advised patient that Repatha is very effective in lowering cholesterol levels but will inform Dr. Mona to advise. Patient verbalized understanding. Patient requested a refill for lovastatin . Medication refilled.   Josie RN

## 2024-01-23 NOTE — Addendum Note (Signed)
 Addended by: ANDREZ PRAIRIE on: 01/23/2024 12:31 PM   Modules accepted: Orders

## 2024-02-10 ENCOUNTER — Telehealth: Payer: Self-pay | Admitting: Internal Medicine

## 2024-02-10 NOTE — Telephone Encounter (Signed)
 Patient calling to follow up on his MyChart message sent on 01/30/24. It was about his preferred pharmacy for Repatha  Rx. Preferred pharmacy is  [id number] - [place],  - [address]   Prescription has not been sent yet. Please advise.

## 2024-02-13 MED ORDER — REPATHA SURECLICK 140 MG/ML ~~LOC~~ SOAJ: 1.0000 mL | SUBCUTANEOUS | 11 refills | Status: AC

## 2024-05-08 ENCOUNTER — Other Ambulatory Visit: Payer: Self-pay | Admitting: Internal Medicine

## 2024-05-08 DIAGNOSIS — I4892 Unspecified atrial flutter: Secondary | ICD-10-CM

## 2024-05-13 DIAGNOSIS — Z1212 Encounter for screening for malignant neoplasm of rectum: Secondary | ICD-10-CM | POA: Diagnosis not present

## 2024-07-15 ENCOUNTER — Other Ambulatory Visit: Payer: Self-pay | Admitting: Internal Medicine

## 2024-07-15 DIAGNOSIS — I4891 Unspecified atrial fibrillation: Secondary | ICD-10-CM

## 2024-07-15 NOTE — Telephone Encounter (Signed)
 Prescription refill request for Eliquis  received. Indication: afib  Last office visit: Hilty, 01/02/2024 Scr: 1.2, 06/16/2024 Age: 75 yo  Weight: 87.6 kg  Refill sent.
